# Patient Record
Sex: Male | Born: 1975
Health system: Southern US, Community
[De-identification: ages and names within clinical notes are randomized; demographics above are authoritative.]

## PROBLEM LIST (undated history)

## (undated) DIAGNOSIS — J181 Lobar pneumonia, unspecified organism: Secondary | ICD-10-CM

## (undated) DIAGNOSIS — D473 Essential (hemorrhagic) thrombocythemia: Secondary | ICD-10-CM

## (undated) DIAGNOSIS — T7840XA Allergy, unspecified, initial encounter: Secondary | ICD-10-CM

## (undated) HISTORY — DX: Lobar pneumonia, unspecified organism: J18.1

## (undated) HISTORY — PX: TONSILLECTOMY: SHX5217

## (undated) HISTORY — PX: TONSILLECTOMY AND ADENOIDECTOMY: SHX28

## (undated) HISTORY — DX: Allergy, unspecified, initial encounter: T78.40XA

---

## 2009-10-30 ENCOUNTER — Emergency Department (HOSPITAL_COMMUNITY): Admission: EM | Admit: 2009-10-30 | Discharge: 2009-10-30 | Payer: Self-pay | Admitting: Family Medicine

## 2010-03-30 ENCOUNTER — Ambulatory Visit: Payer: Self-pay | Admitting: Internal Medicine

## 2010-03-30 DIAGNOSIS — J309 Allergic rhinitis, unspecified: Secondary | ICD-10-CM

## 2010-06-04 ENCOUNTER — Ambulatory Visit: Payer: Self-pay | Admitting: Internal Medicine

## 2010-09-29 NOTE — Assessment & Plan Note (Signed)
Summary: SINUSITIS / CONGESTION // RS   Vital Signs:  Patient profile:   35 year old male Weight:      176 pounds Temp:     97.7 degrees F oral BP sitting:   108 / 76  (right arm) Cuff size:   regular  Vitals Entered By: Duard Brady LPN (June 04, 2010 1:14 PM) CC: c/o sinus congestion otc not helping Is Patient Diabetic? No   CC:  c/o sinus congestion otc not helping.  History of Present Illness: 35 year old patient who is seen today with a chief complaint of sinus and head congestion.  He has had no fever or purulent drainage.  There is been no focal sinus pain.  Does have a history of allergic rhinitis.  He is somewhat improved today.  He kept his appointments since he will be leaving the country for vacation next week.  Allergies: 1)  ! Joyce Copa  Past History:  Past Medical History: Reviewed history from 03/30/2010 and no changes required. Allergic rhinitis  Review of Systems       The patient complains of hoarseness.  The patient denies anorexia, fever, weight loss, weight gain, vision loss, decreased hearing, chest pain, syncope, dyspnea on exertion, peripheral edema, prolonged cough, headaches, hemoptysis, abdominal pain, melena, hematochezia, severe indigestion/heartburn, hematuria, incontinence, genital sores, muscle weakness, suspicious skin lesions, transient blindness, difficulty walking, depression, unusual weight change, abnormal bleeding, enlarged lymph nodes, angioedema, breast masses, and testicular masses.    Physical Exam  General:  Well-developed,well-nourished,in no acute distress; alert,appropriate and cooperative throughout examination Head:  Normocephalic and atraumatic without obvious abnormalities. No apparent alopecia or balding. Eyes:  No corneal or conjunctival inflammation noted. EOMI. Perrla. Funduscopic exam benign, without hemorrhages, exudates or papilledema. Vision grossly normal. Ears:  External ear exam shows no significant lesions  or deformities.  Otoscopic examination reveals clear canals, tympanic membranes are intact bilaterally without bulging, retraction, inflammation or discharge. Hearing is grossly normal bilaterally. Mouth:  Oral mucosa and oropharynx without lesions or exudates.  Teeth in good repair. Neck:  No deformities, masses, or tenderness noted. Lungs:  Normal respiratory effort, chest expands symmetrically. Lungs are clear to auscultation, no crackles or wheezes. Heart:  Normal rate and regular rhythm. S1 and S2 normal without gallop, murmur, click, rub or other extra sounds. Abdomen:  Bowel sounds positive,abdomen soft and non-tender without masses, organomegaly or hernias noted.   Impression & Recommendations:  Problem # 1:  ALLERGIC RHINITIS (ICD-477.9)  His updated medication list for this problem includes:    Fluticasone Propionate 50 Mcg/act Susp (Fluticasone propionate) ..... Use daily    Claritin 10 Mg Tabs (Loratadine)  His updated medication list for this problem includes:    Fluticasone Propionate 50 Mcg/act Susp (Fluticasone propionate) ..... Use daily    Claritin 10 Mg Tabs (Loratadine)  His updated medication list for this problem includes:    Fluticasone Propionate 50 Mcg/act Susp (Fluticasone propionate) ..... Use daily    Claritin 10 Mg Tabs (Loratadine)  Orders: Depo- Medrol 80mg  (J1040) Admin of Therapeutic Inj  intramuscular or subcutaneous (08657)  Complete Medication List: 1)  Fluticasone Propionate 50 Mcg/act Susp (Fluticasone propionate) .... Use daily 2)  Claritin 10 Mg Tabs (Loratadine)  Patient Instructions: 1)  Please schedule a follow-up appointment as needed.   Medication Administration  Injection # 1:    Medication: Depo- Medrol 80mg     Diagnosis: ALLERGIC RHINITIS (ICD-477.9)    Route: IM    Site: L deltoid    Exp  Date: 11/2012    Lot #: [id number]    Mfr: Pharmacia    Patient tolerated injection without complications    Given by: Duard Brady LPN  (June 04, 2010 1:35 PM)  Orders Added: 1)  Est. Patient Level III [16109] 2)  Depo- Medrol 80mg  [J1040] 3)  Admin of Therapeutic Inj  intramuscular or subcutaneous [60454]

## 2010-09-29 NOTE — Assessment & Plan Note (Signed)
Summary: NEW PT EST (PT WILL COME IN FASTING FOR POTENTIAL LABS) // RS   Vital Signs:  Patient profile:   35 year old male Height:      98.4 inches Weight:      173 pounds BMI:     12.61 Temp:     98.4 degrees F oral BP sitting:   150 / 90  (right arm) Cuff size:   regular  Vitals Entered By: Duard Brady LPN (March 30, 2010 8:34 AM) CC: new to establish - doing well Is Patient Diabetic? No   CC:  new to establish - doing well.  History of Present Illness: 35 year old patient who has enjoyed excellent health, who is in today to establish with our practice.  No complaints or concerns.  Past medical history is largely unremarkable except for a history of childhood asthma.  He does have some allergic rhinitis.  Issues  Preventive Screening-Counseling & Management  Alcohol-Tobacco     Smoking Status: never  Caffeine-Diet-Exercise     Does Patient Exercise: yes  Allergies: 1)  ! Joyce Copa  Past History:  Past Medical History: Allergic rhinitis  Past Surgical History: Tonsillectomy  1983  Family History: Reviewed history and no changes required. father age 19, status post CVA without residual functional deficits, history of hypertension, hypercholesterolemia mother is 8 in good health one sister in good health paternal grandmother breast cancer paternal grandfather lung cancer paternal uncle, coronary artery disease  Social History: Reviewed history and no changes required. 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 [employer] Smoking Status:  never Does Patient Exercise:  yes  Review of Systems  The patient denies anorexia, fever, weight loss, weight gain, vision loss, decreased hearing, hoarseness, chest pain, syncope, dyspnea on exertion, peripheral edema, prolonged cough, headaches, hemoptysis, abdominal pain, melena, hematochezia, severe indigestion/heartburn, hematuria, incontinence,  genital sores, muscle weakness, suspicious skin lesions, transient blindness, difficulty walking, depression, unusual weight change, abnormal bleeding, enlarged lymph nodes, angioedema, breast masses, and testicular masses.    Physical Exam  General:  Well-developed,well-nourished,in no acute distress; alert,appropriate and cooperative throughout examination Head:  Normocephalic and atraumatic without obvious abnormalities. No apparent alopecia or balding. Eyes:  No corneal or conjunctival inflammation noted. EOMI. Perrla. Funduscopic exam benign, without hemorrhages, exudates or papilledema. Vision grossly normal. Ears:  External ear exam shows no significant lesions or deformities.  Otoscopic examination reveals clear canals, tympanic membranes are intact bilaterally without bulging, retraction, inflammation or discharge. Hearing is grossly normal bilaterally. Mouth:  Oral mucosa and oropharynx without lesions or exudates.  Teeth in good repair. Neck:  No deformities, masses, or tenderness noted. Lungs:  Normal respiratory effort, chest expands symmetrically. Lungs are clear to auscultation, no crackles or wheezes. Heart:  Normal rate and regular rhythm. S1 and S2 normal without gallop, murmur, click, rub or other extra sounds. Abdomen:  Bowel sounds positive,abdomen soft and non-tender without masses, organomegaly or hernias noted. Genitalia:  Testes bilaterally descended without nodularity, tenderness or masses. No scrotal masses or lesions. No penis lesions or urethral discharge. Msk:  No deformity or scoliosis noted of thoracic or lumbar spine.   Pulses:  R and L carotid,radial,femoral,dorsalis pedis and posterior tibial pulses are full and equal bilaterally Extremities:  No clubbing, cyanosis, edema, or deformity noted with normal full range of motion of all joints.   Neurologic:  No cranial nerve deficits noted. Station and gait are normal. Plantar reflexes are down-going bilaterally. DTRs  are symmetrical throughout.  Sensory, motor and coordinative functions appear intact. Skin:  Intact without suspicious lesions or rashes Cervical Nodes:  No lymphadenopathy noted Axillary Nodes:  No palpable lymphadenopathy Inguinal Nodes:  No significant adenopathy Psych:  Cognition and judgment appear intact. Alert and cooperative with normal attention span and concentration. No apparent delusions, illusions, hallucinations   Impression & Recommendations:  Problem # 1:  ALLERGIC RHINITIS (ICD-477.9)  His updated medication list for this problem includes:    Fluticasone Propionate 50 Mcg/act Susp (Fluticasone propionate) ..... Use daily  Problem # 2:  Preventive Health Care (ICD-V70.0)  Complete Medication List: 1)  Fluticasone Propionate 50 Mcg/act Susp (Fluticasone propionate) .... Use daily  Patient Instructions: 1)  It is important that you exercise regularly at least 20 minutes 5 times a week. If you develop chest pain, have severe difficulty breathing, or feel very tired , stop exercising immediately and seek medical attention. Prescriptions: FLUTICASONE PROPIONATE 50 MCG/ACT SUSP (FLUTICASONE PROPIONATE) use daily  #3 x 6   Entered and Authorized by:   Gordy Savers  MD   Signed by:   Gordy Savers  MD on 03/30/2010   Method used:   Print then Give to Patient   RxID:   [id number]

## 2012-03-06 ENCOUNTER — Telehealth: Payer: Self-pay | Admitting: Internal Medicine

## 2012-03-06 NOTE — Telephone Encounter (Signed)
Caller: Jermane/Patient; PCP: Eleonore Chiquito; CB#: [phone];  Call regarding Abnormal HTLV Test 02/18/12 At [address];  Initially, test was reactive, then negative on repeat test. Denies any symptoms.  Transferred to office/Latoya for caller requesting routine appt, triage offered and declined per PCP Call, No Triage Guideline.

## 2012-03-06 NOTE — Telephone Encounter (Signed)
appt made for cpx labs 03/10/12 and 03/17/12 for cpx

## 2012-03-10 ENCOUNTER — Encounter: Payer: Self-pay | Admitting: Internal Medicine

## 2012-03-10 ENCOUNTER — Other Ambulatory Visit (INDEPENDENT_AMBULATORY_CARE_PROVIDER_SITE_OTHER): Payer: 59

## 2012-03-10 DIAGNOSIS — Z0000 Encounter for general adult medical examination without abnormal findings: Secondary | ICD-10-CM

## 2012-03-10 LAB — CBC WITH DIFFERENTIAL/PLATELET
Basophils Absolute: 0 10*3/uL (ref 0.0–0.1)
Lymphocytes Relative: 15.9 % (ref 12.0–46.0)
Lymphs Abs: 1 10*3/uL (ref 0.7–4.0)
Monocytes Relative: 7.3 % (ref 3.0–12.0)
Platelets: 128 10*3/uL — ABNORMAL LOW (ref 150.0–400.0)
RDW: 12.8 % (ref 11.5–14.6)

## 2012-03-10 LAB — POCT URINALYSIS DIPSTICK
Blood, UA: NEGATIVE
Ketones, UA: NEGATIVE
Leukocytes, UA: NEGATIVE
Spec Grav, UA: 1.015
pH, UA: 5.5

## 2012-03-10 LAB — HEPATIC FUNCTION PANEL
AST: 27 U/L (ref 0–37)
Total Bilirubin: 1 mg/dL (ref 0.3–1.2)

## 2012-03-10 LAB — TSH: TSH: 3.16 u[IU]/mL (ref 0.35–5.50)

## 2012-03-10 LAB — LIPID PANEL
HDL: 43.4 mg/dL (ref >39.00)
Triglycerides: 56 mg/dL (ref 0.0–149.0)
VLDL: 11.2 mg/dL (ref 0.0–40.0)

## 2012-03-10 LAB — BASIC METABOLIC PANEL
BUN: 21 mg/dL (ref 6–23)
Calcium: 9.3 mg/dL (ref 8.4–10.5)
GFR: 80.64 mL/min (ref >60.00)
Glucose, Bld: 96 mg/dL (ref 70–99)

## 2012-03-17 ENCOUNTER — Encounter: Payer: Self-pay | Admitting: Internal Medicine

## 2012-03-17 ENCOUNTER — Ambulatory Visit (INDEPENDENT_AMBULATORY_CARE_PROVIDER_SITE_OTHER): Payer: 59 | Admitting: Internal Medicine

## 2012-03-17 VITALS — BP 110/72 | HR 76 | Temp 98.3°F | Resp 20 | Ht 68.5 in | Wt 171.0 lb

## 2012-03-17 DIAGNOSIS — Z0000 Encounter for general adult medical examination without abnormal findings: Secondary | ICD-10-CM

## 2012-03-17 NOTE — Progress Notes (Signed)
  Subjective:    Patient ID: Robert Young, male    DOB: 12-25-1975, 36 y.o.   MRN: [id number]  HPI 36 year old patient who enjoys excellent health who is seen today for a health maintenance examination. He donates blood on a regular basis and has recently had a positive antibody screen for HTLV. This was not felt to be clinically relevant. No concerns or complaints. Has some mild allergic rhinitis issues  Family history fairly noncontributory father age 73 history of a stroke in his late 51s history of dyslipidemia and hypertension. Mother is 50 nonsignificant health one sister has thyroid disorder but otherwise well. Social history he is a Theatre manager, Public affairs consultant. 71-year-old daughter. Nonsmoker. Enjoys regular exercise.      Review of Systems  Constitutional: Negative for fever, chills, activity change, appetite change and fatigue.  HENT: Negative for hearing loss, ear pain, congestion, rhinorrhea, sneezing, mouth sores, trouble swallowing, neck pain, neck stiffness, dental problem, voice change, sinus pressure and tinnitus.   Eyes: Negative for photophobia, pain, redness and visual disturbance.  Respiratory: Negative for apnea, cough, choking, chest tightness, shortness of breath and wheezing.   Cardiovascular: Negative for chest pain, palpitations and leg swelling.  Gastrointestinal: Negative for nausea, vomiting, abdominal pain, diarrhea, constipation, blood in stool, abdominal distention, anal bleeding and rectal pain.  Genitourinary: Negative for dysuria, urgency, frequency, hematuria, flank pain, decreased urine volume, discharge, penile swelling, scrotal swelling, difficulty urinating, genital sores and testicular pain.  Musculoskeletal: Negative for myalgias, back pain, joint swelling, arthralgias and gait problem.  Skin: Negative for color change, rash and wound.  Neurological: Negative for dizziness, tremors, seizures, syncope, facial asymmetry, speech difficulty, weakness,  light-headedness, numbness and headaches.  Hematological: Negative for adenopathy. Does not bruise/bleed easily.  Psychiatric/Behavioral: Negative for suicidal ideas, hallucinations, behavioral problems, confusion, disturbed wake/sleep cycle, self-injury, dysphoric mood, decreased concentration and agitation. The patient is not nervous/anxious.        Objective:   Physical Exam  Constitutional: He appears well-developed and well-nourished.  HENT:  Head: Normocephalic and atraumatic.  Right Ear: External ear normal.  Left Ear: External ear normal.  Nose: Nose normal.  Mouth/Throat: Oropharynx is clear and moist.  Eyes: Conjunctivae and EOM are normal. Pupils are equal, round, and reactive to light. No scleral icterus.  Neck: Normal range of motion. Neck supple. No JVD present. No thyromegaly present.  Cardiovascular: Regular rhythm, normal heart sounds and intact distal pulses.  Exam reveals no gallop and no friction rub.   No murmur heard. Pulmonary/Chest: Effort normal and breath sounds normal. He exhibits no tenderness.  Abdominal: Soft. Bowel sounds are normal. He exhibits no distension and no mass. There is no tenderness.  Genitourinary: Prostate normal and penis normal.  Musculoskeletal: Normal range of motion. He exhibits no edema and no tenderness.  Lymphadenopathy:    He has no cervical adenopathy.  Neurological: He is alert. He has normal reflexes. No cranial nerve deficit. Coordination normal.  Skin: Skin is warm and dry. No rash noted.  Psychiatric: He has a normal mood and affect. His behavior is normal.          Assessment & Plan:  Preventive health examination  Regular exercise regimen encouraged Laboratory studies reviewed and copies given Return here when necessary or in 3-5 years.

## 2012-03-17 NOTE — Patient Instructions (Addendum)
It is important that you exercise regularly, at least 20 minutes 3 to 4 times per week.  If you develop chest pain or shortness of breath seek  medical attention.  Call or return to clinic prn if these symptoms worsen or fail to improve as anticipated.  

## 2013-09-19 ENCOUNTER — Encounter: Payer: Self-pay | Admitting: Internal Medicine

## 2013-09-19 ENCOUNTER — Ambulatory Visit (INDEPENDENT_AMBULATORY_CARE_PROVIDER_SITE_OTHER): Payer: 59 | Admitting: Internal Medicine

## 2013-09-19 VITALS — BP 126/80 | HR 58 | Temp 98.3°F | Resp 18 | Ht 68.5 in | Wt 177.0 lb

## 2013-09-19 DIAGNOSIS — M25512 Pain in left shoulder: Secondary | ICD-10-CM

## 2013-09-19 DIAGNOSIS — M25519 Pain in unspecified shoulder: Secondary | ICD-10-CM

## 2013-09-19 NOTE — Progress Notes (Signed)
Subjective:    Patient ID: Robert Young, male    DOB: Jan 21, 1976, 38 y.o.   MRN: [id number]  HPI  38 year old patient who presents with a chief complaint of left shoulder pain. This has been present for 6 or 7 months. He describes remote history of perhaps separating his left shoulder about 15 years ago. He is unclear of the details. He describes the pain as fairly minor but occurring daily. Pain is most significant when he lifts his left hand overhead. There has been no recent trauma but he is active with golf and his gym membership.  Past Medical History  Diagnosis Date  . Allergy     History   Social History  . Marital Status: Married    Spouse Name: N/A    Number of Children: N/A  . Years of Education: N/A   Occupational History  . Not on file.   Social History Main Topics  . Smoking status: Never Smoker   . Smokeless tobacco: Current User    Types: Chew  . Alcohol Use: Yes  . Drug Use: No  . Sexual Activity: Not on file   Other Topics Concern  . Not on file   Social History Narrative  . No narrative on file    Past Surgical History  Procedure Laterality Date  . Tonsillectomy      Family History  Problem Relation Age of Onset  . Heart disease Father   . Hyperlipidemia Father   . Hypertension Father   . Heart disease Paternal Uncle   . Cancer Paternal Grandmother     breast    Allergies  Allergen Reactions  . Fexofenadine     No current outpatient prescriptions on file prior to visit.   No current facility-administered medications on file prior to visit.    BP 126/80  Pulse 58  Temp(Src) 98.3 F (36.8 C) (Oral)  Resp 18  Ht 5' 8.5" (1.74 m)  Wt 177 lb (80.287 kg)  BMI 26.52 kg/m2  SpO2 99%       Review of Systems  Constitutional: Negative for fever, chills, appetite change and fatigue.  HENT: Negative for congestion, dental problem, ear pain, hearing loss, sore throat, tinnitus, trouble swallowing and voice change.   Eyes: Negative  for pain, discharge and visual disturbance.  Respiratory: Negative for cough, chest tightness, wheezing and stridor.   Cardiovascular: Negative for chest pain, palpitations and leg swelling.  Gastrointestinal: Negative for nausea, vomiting, abdominal pain, diarrhea, constipation, blood in stool and abdominal distention.  Genitourinary: Negative for urgency, hematuria, flank pain, discharge, difficulty urinating and genital sores.  Musculoskeletal: Positive for arthralgias. Negative for back pain, gait problem, joint swelling, myalgias and neck stiffness.       Left shoulder pain  Skin: Negative for rash.  Neurological: Negative for dizziness, syncope, speech difficulty, weakness, numbness and headaches.  Hematological: Negative for adenopathy. Does not bruise/bleed easily.  Psychiatric/Behavioral: Negative for behavioral problems and dysphoric mood. The patient is not nervous/anxious.        Objective:   Physical Exam  Constitutional: He appears well-developed and well-nourished. No distress.  Musculoskeletal:  Left shoulder examined No focal tenderness Range of motion appear to be intact Painful arc test revealed minimal discomfort at 120 Internal and external rotation lag test as well as external rotation resistance test and drop arm test were all normal          Assessment & Plan:   Left shoulder pain. Probable mild rotator cuff tendinopathy. We'll  give rehabilitation exercises and observe. He'll call if he is not pleased with his progress for consideration of orthopedic referral or more formal physical therapy

## 2013-09-19 NOTE — Patient Instructions (Signed)

## 2013-09-19 NOTE — Progress Notes (Signed)
Pre-visit discussion using our clinic review tool. No additional management support is needed unless otherwise documented below in the visit note.  

## 2013-10-02 ENCOUNTER — Telehealth: Payer: Self-pay | Admitting: Internal Medicine

## 2013-10-02 NOTE — Telephone Encounter (Signed)
Relevant patient education mailed to patient.  

## 2014-11-04 ENCOUNTER — Ambulatory Visit (INDEPENDENT_AMBULATORY_CARE_PROVIDER_SITE_OTHER): Payer: 59 | Admitting: Internal Medicine

## 2014-11-04 ENCOUNTER — Encounter: Payer: Self-pay | Admitting: Internal Medicine

## 2014-11-04 VITALS — BP 110/72 | HR 64 | Temp 98.2°F | Resp 18 | Ht 68.5 in | Wt 174.0 lb

## 2014-11-04 DIAGNOSIS — L309 Dermatitis, unspecified: Secondary | ICD-10-CM

## 2014-11-04 MED ORDER — PREDNISONE 10 MG PO TABS: 10.0000 mg | ORAL_TABLET | Freq: Two times a day (BID) | ORAL | Status: DC

## 2014-11-04 NOTE — Progress Notes (Signed)
Pre visit review using our clinic review tool, if applicable. No additional management support is needed unless otherwise documented below in the visit note. 

## 2014-11-04 NOTE — Patient Instructions (Signed)
Eczema Eczema, also called atopic dermatitis, is a skin disorder that causes inflammation of the skin. It causes a red rash and dry, scaly skin. The skin becomes very itchy. Eczema is generally worse during the cooler winter months and often improves with the warmth of summer. Eczema usually starts showing signs in infancy. Some children outgrow eczema, but it may last through adulthood.  CAUSES  The exact cause of eczema is not known, but it appears to run in families. People with eczema often have a family history of eczema, allergies, asthma, or hay fever. Eczema is not contagious. Flare-ups of the condition may be caused by:   Contact with something you are sensitive or allergic to.   Stress. SIGNS AND SYMPTOMS  Dry, scaly skin.   Red, itchy rash.   Itchiness. This may occur before the skin rash and may be very intense.  DIAGNOSIS  The diagnosis of eczema is usually made based on symptoms and medical history. TREATMENT  Eczema cannot be cured, but symptoms usually can be controlled with treatment and other strategies. A treatment plan might include:  Controlling the itching and scratching.   Use over-the-counter antihistamines as directed for itching. This is especially useful at night when the itching tends to be worse.   Use over-the-counter steroid creams as directed for itching.   Avoid scratching. Scratching makes the rash and itching worse. It may also result in a skin infection (impetigo) due to a break in the skin caused by scratching.   Keeping the skin well moisturized with creams every day. This will seal in moisture and help prevent dryness. Lotions that contain alcohol and water should be avoided because they can dry the skin.   Limiting exposure to things that you are sensitive or allergic to (allergens).   Recognizing situations that cause stress.   Developing a plan to manage stress.  HOME CARE INSTRUCTIONS   Only take over-the-counter or  prescription medicines as directed by your health care provider.   Do not use anything on the skin without checking with your health care provider.   Keep baths or showers short (5 minutes) in warm (not hot) water. Use mild cleansers for bathing. These should be unscented. You may add nonperfumed bath oil to the bath water. It is best to avoid soap and bubble bath.   Immediately after a bath or shower, when the skin is still damp, apply a moisturizing ointment to the entire body. This ointment should be a petroleum ointment. This will seal in moisture and help prevent dryness. The thicker the ointment, the better. These should be unscented.   Keep fingernails cut short. Children with eczema may need to wear soft gloves or mittens at night after applying an ointment.   Dress in clothes made of cotton or cotton blends. Dress lightly, because heat increases itching.   A child with eczema should stay away from anyone with fever blisters or cold sores. The virus that causes fever blisters (herpes simplex) can cause a serious skin infection in children with eczema. SEEK MEDICAL CARE IF:   Your itching interferes with sleep.   Your rash gets worse or is not better within 1 week after starting treatment.   You see pus or soft yellow scabs in the rash area.   You have a fever.   You have a rash flare-up after contact with someone who has fever blisters.  Document Released: 08/13/2000 Document Revised: 06/06/2013 Document Reviewed: 03/19/2013 Exit[employer] Patient Information 2015 [employer]. This information   is not intended to replace advice given to you by your health care provider. Make sure you discuss any questions you have with your health care provider.  

## 2014-11-04 NOTE — Progress Notes (Signed)
   Subjective:    Patient ID: Robert Young, male    DOB: 13-Dec-1975, 39 y.o.   MRN: [id number]  HPI  39 year old patient who presents with a three-week history of a fairly generalized pruritic rash.  This illness began involving the right hand area about 3 weeks ago.  It has spread to involve all extremities as well as the trunk and upper back area.  No history of atopic dermatitis, but he does have a history of allergic rhinitis.  No history of asthma  Past Medical History  Diagnosis Date  . Allergy     History   Social History  . Marital Status: Married    Spouse Name: N/A  . Number of Children: N/A  . Years of Education: N/A   Occupational History  . Not on file.   Social History Main Topics  . Smoking status: Never Smoker   . Smokeless tobacco: Current User    Types: Chew  . Alcohol Use: Yes  . Drug Use: No  . Sexual Activity: Not on file   Other Topics Concern  . Not on file   Social History Narrative    Past Surgical History  Procedure Laterality Date  . Tonsillectomy      Family History  Problem Relation Age of Onset  . Heart disease Father   . Hyperlipidemia Father   . Hypertension Father   . Heart disease Paternal Uncle   . Cancer Paternal Grandmother     breast    Allergies  Allergen Reactions  . Fexofenadine     Current Outpatient Prescriptions on File Prior to Visit  Medication Sig Dispense Refill  . naproxen sodium (ALEVE) 220 MG tablet Take 220 mg by mouth as needed.     No current facility-administered medications on file prior to visit.    BP 110/72 mmHg  Pulse 64  Temp(Src) 98.2 F (36.8 C) (Oral)  Resp 18  Ht 5' 8.5" (1.74 m)  Wt 174 lb (78.926 kg)  BMI 26.07 kg/m2  SpO2 98%     Review of Systems  Skin: Positive for rash.       Objective:   Physical Exam  Constitutional: He appears well-developed and well-nourished. No distress.  Skin:  Scattered areas of erythematous macules with some excoriations and  bruising Rashes present on the arms.  Extremities anterior chest and upper back          Assessment & Plan:   Eczema type rash.  Will treat with the prednisone 10 mg twice a day for a week was tried lotion will be applied Patient  Instructions for treatment of eczema type dermatitis.  Discussed and dispensed

## 2015-01-13 ENCOUNTER — Ambulatory Visit (INDEPENDENT_AMBULATORY_CARE_PROVIDER_SITE_OTHER): Payer: 59 | Admitting: Internal Medicine

## 2015-01-13 ENCOUNTER — Encounter: Payer: Self-pay | Admitting: Internal Medicine

## 2015-01-13 ENCOUNTER — Telehealth: Payer: Self-pay

## 2015-01-13 ENCOUNTER — Other Ambulatory Visit: Payer: Self-pay | Admitting: Hematology & Oncology

## 2015-01-13 VITALS — BP 120/80 | HR 67 | Temp 97.9°F | Resp 18 | Ht 68.5 in | Wt 175.0 lb

## 2015-01-13 DIAGNOSIS — R233 Spontaneous ecchymoses: Secondary | ICD-10-CM | POA: Diagnosis not present

## 2015-01-13 DIAGNOSIS — D693 Immune thrombocytopenic purpura: Secondary | ICD-10-CM

## 2015-01-13 LAB — COMPREHENSIVE METABOLIC PANEL
ALBUMIN: 4.7 g/dL (ref 3.5–5.2)
ALK PHOS: 53 U/L (ref 39–117)
ALT: 20 U/L (ref 0–53)
AST: 25 U/L (ref 0–37)
BILIRUBIN TOTAL: 0.9 mg/dL (ref 0.2–1.2)
BUN: 16 mg/dL (ref 6–23)
CALCIUM: 9.6 mg/dL (ref 8.4–10.5)
CO2: 31 mEq/L (ref 19–32)
CREATININE: 1.19 mg/dL (ref 0.40–1.50)
Chloride: 102 mEq/L (ref 96–112)
GFR: 73.21 mL/min (ref >60.00)
GLUCOSE: 119 mg/dL — AB (ref 70–99)
POTASSIUM: 3.5 meq/L (ref 3.5–5.1)
Sodium: 139 mEq/L (ref 135–145)
TOTAL PROTEIN: 7.1 g/dL (ref 6.0–8.3)

## 2015-01-13 LAB — CBC WITH DIFFERENTIAL/PLATELET
BASOS ABS: 0 10*3/uL (ref 0.0–0.1)
Basophils Relative: 0.3 % (ref 0.0–3.0)
EOS ABS: 0.3 10*3/uL (ref 0.0–0.7)
Eosinophils Relative: 3.9 % (ref 0.0–5.0)
HEMATOCRIT: 43.6 % (ref 39.0–52.0)
HEMOGLOBIN: 15 g/dL (ref 13.0–17.0)
LYMPHS ABS: 1.9 10*3/uL (ref 0.7–4.0)
Lymphocytes Relative: 29.8 % (ref 12.0–46.0)
MCHC: 34.4 g/dL (ref 30.0–36.0)
MCV: 86.9 fl (ref 78.0–100.0)
MONO ABS: 0.4 10*3/uL (ref 0.1–1.0)
Monocytes Relative: 5.7 % (ref 3.0–12.0)
NEUTROS ABS: 3.9 10*3/uL (ref 1.4–7.7)
Neutrophils Relative %: 60.3 % (ref 43.0–77.0)
RBC: 5.01 Mil/uL (ref 4.22–5.81)
RDW: 12.4 % (ref 11.5–15.5)
WBC: 6.4 10*3/uL (ref 4.0–10.5)

## 2015-01-13 LAB — POCT URINALYSIS DIPSTICK
BILIRUBIN UA: NEGATIVE
Glucose, UA: NEGATIVE
Ketones, UA: NEGATIVE
Leukocytes, UA: NEGATIVE
Nitrite, UA: NEGATIVE
Protein, UA: NEGATIVE
Spec Grav, UA: 1.015
Urobilinogen, UA: 0.2
pH, UA: 6

## 2015-01-13 LAB — TSH: TSH: 7.7 u[IU]/mL — ABNORMAL HIGH (ref 0.35–4.50)

## 2015-01-13 NOTE — Telephone Encounter (Signed)
CRITICAL VALUE STICKER  CRITICAL VALUE: Platelets 23  RECEIVER [facility]): ACM   DATE & TIME NOTIFIED: Jan 13, 2015 at 4:37  MD NOTIFIED: Burnice Logan   TIME OF NOTIFICATION: 4:40 pm  RESPONSE:

## 2015-01-13 NOTE — Progress Notes (Signed)
Pre visit review using our clinic review tool, if applicable. No additional management support is needed unless otherwise documented below in the visit note. 

## 2015-01-13 NOTE — Progress Notes (Signed)
Subjective:    Patient ID: Robert Young, male    DOB: 06-05-1976, 39 y.o.   MRN: [id number]  HPI 39 year old patient who was seen about 2 months ago for a pruritic dermatitis.  He seemed to have a dry pruritic eczematous type rash.  He was placed on 10 days of low-dose prednisone and the dermatitis resolved after 5 days.  The rash returned after discontinuation of prednisone He complains of some mild  itching.  He also has had several episodes of epistaxis since his last visit here.  He exercises regularly and has no systemic complaints feels well except for the dermatitis He also has noticed some bruising in the right flank area. He has been using some occasional nasal fluticasone, but otherwise no medications  Past Medical History  Diagnosis Date  . Allergy     History   Social History  . Marital Status: Married    Spouse Name: N/A  . Number of Children: N/A  . Years of Education: N/A   Occupational History  . Not on file.   Social History Main Topics  . Smoking status: Never Smoker   . Smokeless tobacco: Current User    Types: Chew  . Alcohol Use: Yes  . Drug Use: No  . Sexual Activity: Not on file   Other Topics Concern  . Not on file   Social History Narrative    Past Surgical History  Procedure Laterality Date  . Tonsillectomy      Family History  Problem Relation Age of Onset  . Heart disease Father   . Hyperlipidemia Father   . Hypertension Father   . Heart disease Paternal Uncle   . Cancer Paternal Grandmother     breast    Allergies  Allergen Reactions  . Fexofenadine     Current Outpatient Prescriptions on File Prior to Visit  Medication Sig Dispense Refill  . naproxen sodium (ALEVE) 220 MG tablet Take 220 mg by mouth as needed.     No current facility-administered medications on file prior to visit.    BP 120/80 mmHg  Pulse 67  Temp(Src) 97.9 F (36.6 C) (Oral)  Resp 18  Ht 5' 8.5" (1.74 m)  Wt 175 lb (79.379 kg)  BMI 26.22  kg/m2  SpO2 98%      Review of Systems  Constitutional: Negative for fever, chills, appetite change and fatigue.  HENT: Negative for congestion, dental problem, ear pain, hearing loss, sore throat, tinnitus, trouble swallowing and voice change.   Eyes: Negative for pain, discharge and visual disturbance.  Respiratory: Negative for cough, chest tightness, wheezing and stridor.   Cardiovascular: Negative for chest pain, palpitations and leg swelling.  Gastrointestinal: Negative for nausea, vomiting, abdominal pain, diarrhea, constipation, blood in stool and abdominal distention.  Genitourinary: Negative for urgency, hematuria, flank pain, discharge, difficulty urinating and genital sores.  Musculoskeletal: Negative for myalgias, back pain, joint swelling, arthralgias, gait problem and neck stiffness.  Skin: Positive for rash.  Neurological: Negative for dizziness, syncope, speech difficulty, weakness, numbness and headaches.  Hematological: Negative for adenopathy. Does not bruise/bleed easily.  Psychiatric/Behavioral: Negative for behavioral problems and dysphoric mood. The patient is not nervous/anxious.        Objective:   Physical Exam  Constitutional: He is oriented to person, place, and time. He appears well-developed.  HENT:  Head: Normocephalic.  Right Ear: External ear normal.  Left Ear: External ear normal.  Eyes: Conjunctivae and EOM are normal.  Neck: Normal range of motion.  Cardiovascular: Normal rate and normal heart sounds.   Pulmonary/Chest: Breath sounds normal.  Abdominal: Bowel sounds are normal.  Liver edge palpable on inspiration  Musculoskeletal: Normal range of motion. He exhibits no edema or tenderness.  Neurological: He is alert and oriented to person, place, and time.  Skin:  An approximate 4 x 8 cm ecchymoses in the right flank area  Scattered petechial lesions over the upper chest, upper arms and abdominal area.  A few scattered lesions over the  anterior lower legs Excoriations noted over the upper shoulder and back regions  Psychiatric: He has a normal mood and affect. His behavior is normal.          Assessment & Plan:   Petechial rash.  Rule out thrombocytopenia.  Likely will need hematology referral History of epistaxis Possible hepatomegaly  We'll check some primary lab studies with special attention to the platelet count. All medications will be held.

## 2015-01-13 NOTE — Patient Instructions (Signed)
Avoid aspirin, Aleve and other anti-inflammatory medications  Call or return to clinic prn if these symptoms worsen or fail to improve as anticipated.

## 2015-01-14 ENCOUNTER — Ambulatory Visit: Payer: 59

## 2015-01-14 ENCOUNTER — Ambulatory Visit (HOSPITAL_BASED_OUTPATIENT_CLINIC_OR_DEPARTMENT_OTHER): Payer: 59 | Admitting: Hematology & Oncology

## 2015-01-14 ENCOUNTER — Telehealth: Payer: Self-pay | Admitting: *Deleted

## 2015-01-14 ENCOUNTER — Other Ambulatory Visit (HOSPITAL_BASED_OUTPATIENT_CLINIC_OR_DEPARTMENT_OTHER): Payer: 59

## 2015-01-14 ENCOUNTER — Encounter: Payer: Self-pay | Admitting: Hematology & Oncology

## 2015-01-14 VITALS — BP 137/83 | HR 67 | Temp 98.3°F | Resp 18 | Ht 69.0 in | Wt 172.0 lb

## 2015-01-14 DIAGNOSIS — D693 Immune thrombocytopenic purpura: Secondary | ICD-10-CM

## 2015-01-14 DIAGNOSIS — D696 Thrombocytopenia, unspecified: Secondary | ICD-10-CM

## 2015-01-14 LAB — CBC WITH DIFFERENTIAL (CANCER CENTER ONLY)
BASO#: 0 10*3/uL (ref 0.0–0.2)
BASO%: 0.2 % (ref 0.0–2.0)
EOS%: 1.8 % (ref 0.0–7.0)
Eosinophils Absolute: 0.1 10*3/uL (ref 0.0–0.5)
HCT: 40.8 % (ref 38.7–49.9)
HEMOGLOBIN: 14.7 g/dL (ref 13.0–17.1)
LYMPH#: 0.9 10*3/uL (ref 0.9–3.3)
LYMPH%: 21.2 % (ref 14.0–48.0)
MCH: 31 pg (ref 28.0–33.4)
MCHC: 36 g/dL — ABNORMAL HIGH (ref 32.0–35.9)
MCV: 86 fL (ref 82–98)
MONO#: 0.3 10*3/uL (ref 0.1–0.9)
MONO%: 5.7 % (ref 0.0–13.0)
NEUT#: 3.1 10*3/uL (ref 1.5–6.5)
NEUT%: 71.1 % (ref 40.0–80.0)
Platelets: 21 10*3/uL — ABNORMAL LOW (ref 145–400)
RBC: 4.74 10*6/uL (ref 4.20–5.70)
RDW: 12.4 % (ref 11.1–15.7)
WBC: 4.4 10*3/uL (ref 4.0–10.0)

## 2015-01-14 LAB — LACTATE DEHYDROGENASE: LDH: 170 U/L (ref 94–250)

## 2015-01-14 LAB — COMPREHENSIVE METABOLIC PANEL
ALBUMIN: 4.8 g/dL (ref 3.5–5.2)
ALT: 18 U/L (ref 0–53)
AST: 23 U/L (ref 0–37)
Alkaline Phosphatase: 49 U/L (ref 39–117)
BUN: 15 mg/dL (ref 6–23)
CO2: 26 mEq/L (ref 19–32)
Calcium: 9.4 mg/dL (ref 8.4–10.5)
Chloride: 104 mEq/L (ref 96–112)
Creatinine, Ser: 1.16 mg/dL (ref 0.50–1.35)
GLUCOSE: 117 mg/dL — AB (ref 70–99)
POTASSIUM: 4 meq/L (ref 3.5–5.3)
SODIUM: 143 meq/L (ref 135–145)
Total Bilirubin: 1.8 mg/dL — ABNORMAL HIGH (ref 0.2–1.2)
Total Protein: 7 g/dL (ref 6.0–8.3)

## 2015-01-14 LAB — CHCC SATELLITE - SMEAR

## 2015-01-14 LAB — HEPATITIS PANEL, ACUTE
HCV Ab: NEGATIVE
HEP A IGM: NONREACTIVE
Hep B C IgM: NONREACTIVE
Hepatitis B Surface Ag: NEGATIVE

## 2015-01-14 LAB — HIV ANTIBODY (ROUTINE TESTING W REFLEX): HIV: NONREACTIVE

## 2015-01-14 LAB — TECHNOLOGIST REVIEW CHCC SATELLITE

## 2015-01-14 MED ORDER — FAMCICLOVIR 500 MG PO TABS: 500.0000 mg | ORAL_TABLET | Freq: Every day | ORAL | Status: DC

## 2015-01-14 MED ORDER — PREDNISONE 20 MG PO TABS: 80.0000 mg | ORAL_TABLET | Freq: Every day | ORAL | Status: DC

## 2015-01-14 NOTE — Telephone Encounter (Signed)
Per the [facility] office I have ascheduled appts. They will call him with appts

## 2015-01-14 NOTE — Progress Notes (Signed)
Referral MD  Reason for Referral: Thrombocytopenia-likely chronic immune thrombocytopenia   No chief complaint on file. : My platelets are low.  HPI: Mr. Headen is a really nice 39 year old white male. 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  He's been in great health. He works out. He has 2 kids.  He knows the rash about 6 weeks ago. His rashes all over his body. He had no fever associate with this rash. He had no nausea vomiting. There is no change in bowel or bladder habits. He had no cough. He had no shortness of breath. He really has not been on any medications. He really has not changed anything with his diet.  He saw his primary doctor. Dr. Burnice Logan put him on some prednisone. The rash seemed to go away.  Most recently, he had recurrence of the rash. He had a large ecchymoses in the right flank.  Dr. Burnice Logan is some lab work. He found that in the platelet count was down to 23,000. He had a normal white cell count and hemoglobin.  Going to the lab records, his blood count was 128,003 years ago.  He's had no obvious bleeding. He's had maybe occasional epistaxis.  There is no weight loss or weight gain. He's had no palpable lymph nodes. He's had no abdominal pain.  He does not smoke. He drinks on occasion.  There is no risk factors for HIV or hepatitis. He never has had any kind of blood transfusion.  He was kindly referred to the [facility] for an evaluation.  Again, he works in [employer]. He is not exposed to actual radiation doses.  There is no history in the family of any kind of blood or platelet problem.  He works out. He's had no problems working out.  Overall, his performance status is ECOG 0.   Past Medical History  Diagnosis Date  . Allergy   :  Past Surgical History  Procedure Laterality Date  . Tonsillectomy    :   Current outpatient prescriptions:  .  naproxen sodium (ALEVE) 220 MG  tablet, Take 220 mg by mouth as needed., Disp: , Rfl:  .  famciclovir (FAMVIR) 500 MG tablet, Take 1 tablet (500 mg total) by mouth daily., Disp: 30 tablet, Rfl: 4 .  predniSONE (DELTASONE) 20 MG tablet, Take 4 tablets (80 mg total) by mouth daily with breakfast., Disp: 120 tablet, Rfl: 2:  :  Allergies  Allergen Reactions  . Fexofenadine   :  Family History  Problem Relation Age of Onset  . Heart disease Father   . Hyperlipidemia Father   . Hypertension Father   . Heart disease Paternal Uncle   . Cancer Paternal Grandmother     breast  :  History   Social History  . Marital Status: Married    Spouse Name: N/A  . Number of Children: N/A  . Years of Education: N/A   Occupational History  . Not on file.   Social History Main Topics  . Smoking status: Never Smoker   . Smokeless tobacco: Current User    Types: Chew     Comment: NEVER USED TOBACCO  . Alcohol Use: 0.0 oz/week    0 Standard drinks or equivalent per week  . Drug Use: No  . Sexual Activity: Not on file   Other Topics Concern  . Not on file   Social History Narrative  :  Pertinent items are noted in HPI.  Exam: @IPVITALS @  well-developed and well-nourished white gentleman in no obvious distress. Head and neck exam shows no ocular or oral lesions. He has no intraoral petechia or ecchymoses or hematomas. There are no palpable cervical or supraclavicular lymph nodes. Lungs are clear. Cardiac exam regular rate and rhythm with no murmurs, rubs or bruits. Abdomen is soft. He has good bowel sounds. There is no fluid wave. There is no palpable liver or spleen tip. Back exam shows no tenderness over the spine, ribs or hips. Axillary exam shows no bilateral axillary adenopathy. Extremities shows no clubbing, cyanosis or edema. There is no joint erythema, warmth or swelling. He has good range of motion of his joints. Skin exam shows an ecchymoses over on the right flank. He may have a couple areas of petechia in his  legs. No other areas of ecchymoses are noted. Neurological exam shows no focal neurological deficits.    Recent Labs  01/13/15 1326 01/14/15 0932  WBC 6.4 4.4  HGB 15.0 14.7  HCT 43.6 40.8  PLT 23.0 Repeated and verified X2.* 21*    Recent Labs  01/13/15 1326  NA 139  K 3.5  CL 102  CO2 31  GLUCOSE 119*  BUN 16  CREATININE 1.19  CALCIUM 9.6    Blood smear review: Normochromic and normocytic population of red blood cells. He has no nucleator red blood cells. There are no teardrop cells. There are no schistocytes or spherocytes. I see no target cells. There are no inclusion bodies. White cells are normal in morphology maturation. There is no immature myeloid or lymphoid forms. He has no hypersegmented polys. I see no atypical lymphocytes. Platelets are decreased in number. He has marked weight was decreased. Has a few large platelets. Platelets are well granulated.  Pathology: None     Assessment and Plan: Mr. Centrella is a 39 year old white male. He has isolated thrombocytopenia. His exam is present unremarkable. I cannot palpate any hepatosplenomegaly or lymphadenopathy.  His blood smear certainly is not concerning for any hematologic malignancy. I don't see anything that looks like leukemia or other marrow infiltrative disorder.  I have to believe that this is chronic immune thrombocytopenia. As such, I think we can start him on prednisone. I will put him on 80 mg a day of prednisone. I will give him some Famvir at 500 mg a day to take along with this. I told her to take over-the-counter Pepcid twice a day.  I will like to check his CBC weekly. 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  I don't see that we have to do a bone marrow biopsy on him right now.  I would like to expect that his platelet count will respond. If not, that they will have to consider a bone marrow biopsy on him.  I spent about 60 minutes with him. I went over all of his lab  work. I will does blood smear. I explained what I thought he had.  I think with steroids, that should be about 70% response rate and remission rate.  If he responds and then relapses, then I probably would consider a splenectomy for him.  Again, we will follow his blood work weekly. I will taking a lot of this over the phone with respect to adjust his steroid dose.  I will see him back in one month.

## 2015-01-21 ENCOUNTER — Ambulatory Visit (HOSPITAL_BASED_OUTPATIENT_CLINIC_OR_DEPARTMENT_OTHER): Payer: 59

## 2015-01-21 DIAGNOSIS — D693 Immune thrombocytopenic purpura: Secondary | ICD-10-CM | POA: Diagnosis not present

## 2015-01-21 LAB — CBC WITH DIFFERENTIAL/PLATELET
BASO%: 0 % (ref 0.0–2.0)
Basophils Absolute: 0 10*3/uL (ref 0.0–0.1)
EOS%: 0 % (ref 0.0–7.0)
Eosinophils Absolute: 0 10*3/uL (ref 0.0–0.5)
HCT: 41 % (ref 38.4–49.9)
HGB: 14.7 g/dL (ref 13.0–17.1)
LYMPH%: 7.5 % — ABNORMAL LOW (ref 14.0–49.0)
MCH: 30.9 pg (ref 27.2–33.4)
MCHC: 35.9 g/dL (ref 32.0–36.0)
MCV: 86.1 fL (ref 79.3–98.0)
MONO#: 0.2 10*3/uL (ref 0.1–0.9)
MONO%: 2.8 % (ref 0.0–14.0)
NEUT#: 7.3 10*3/uL — ABNORMAL HIGH (ref 1.5–6.5)
NEUT%: 89.7 % — ABNORMAL HIGH (ref 39.0–75.0)
Platelets: 108 10*3/uL — ABNORMAL LOW (ref 140–400)
RBC: 4.76 10*6/uL (ref 4.20–5.82)
RDW: 12.6 % (ref 11.0–14.6)
WBC: 8.1 10*3/uL (ref 4.0–10.3)
lymph#: 0.6 10*3/uL — ABNORMAL LOW (ref 0.9–3.3)

## 2015-01-22 ENCOUNTER — Other Ambulatory Visit: Payer: 59

## 2015-01-29 ENCOUNTER — Other Ambulatory Visit (HOSPITAL_BASED_OUTPATIENT_CLINIC_OR_DEPARTMENT_OTHER): Payer: 59

## 2015-01-29 DIAGNOSIS — D693 Immune thrombocytopenic purpura: Secondary | ICD-10-CM

## 2015-01-29 LAB — CBC WITH DIFFERENTIAL/PLATELET
BASO%: 0.2 % (ref 0.0–2.0)
BASOS ABS: 0 10*3/uL (ref 0.0–0.1)
EOS ABS: 0.1 10*3/uL (ref 0.0–0.5)
EOS%: 0.5 % (ref 0.0–7.0)
HEMATOCRIT: 45 % (ref 38.4–49.9)
HGB: 15.2 g/dL (ref 13.0–17.1)
LYMPH#: 1.4 10*3/uL (ref 0.9–3.3)
LYMPH%: 11.1 % — AB (ref 14.0–49.0)
MCH: 30 pg (ref 27.2–33.4)
MCHC: 33.9 g/dL (ref 32.0–36.0)
MCV: 88.5 fL (ref 79.3–98.0)
MONO#: 0.6 10*3/uL (ref 0.1–0.9)
MONO%: 4.9 % (ref 0.0–14.0)
NEUT#: 10.8 10*3/uL — ABNORMAL HIGH (ref 1.5–6.5)
NEUT%: 83.3 % — AB (ref 39.0–75.0)
Platelets: 119 10*3/uL — ABNORMAL LOW (ref 140–400)
RBC: 5.08 10*6/uL (ref 4.20–5.82)
RDW: 13.4 % (ref 11.0–14.6)
WBC: 13 10*3/uL — AB (ref 4.0–10.3)

## 2015-01-30 ENCOUNTER — Telehealth: Payer: Self-pay | Admitting: *Deleted

## 2015-01-30 NOTE — Telephone Encounter (Signed)
-----   Message from Volanda Napoleon, MD sent at 01/29/2015  4:33 PM EDT ----- Call - tell him to alternate 60mg  prednisone a day with 40mg  a day.  Robert Young

## 2015-02-05 ENCOUNTER — Other Ambulatory Visit (HOSPITAL_BASED_OUTPATIENT_CLINIC_OR_DEPARTMENT_OTHER): Payer: 59

## 2015-02-05 DIAGNOSIS — D696 Thrombocytopenia, unspecified: Secondary | ICD-10-CM

## 2015-02-05 DIAGNOSIS — D693 Immune thrombocytopenic purpura: Secondary | ICD-10-CM

## 2015-02-05 LAB — CBC WITH DIFFERENTIAL/PLATELET
BASO%: 0.2 % (ref 0.0–2.0)
BASOS ABS: 0 10*3/uL (ref 0.0–0.1)
EOS%: 0.5 % (ref 0.0–7.0)
Eosinophils Absolute: 0.1 10*3/uL (ref 0.0–0.5)
HEMATOCRIT: 42.7 % (ref 38.4–49.9)
HGB: 14.6 g/dL (ref 13.0–17.1)
LYMPH#: 1.1 10*3/uL (ref 0.9–3.3)
LYMPH%: 9.4 % — AB (ref 14.0–49.0)
MCH: 30 pg (ref 27.2–33.4)
MCHC: 34.1 g/dL (ref 32.0–36.0)
MCV: 87.9 fL (ref 79.3–98.0)
MONO#: 0.5 10*3/uL (ref 0.1–0.9)
MONO%: 4.4 % (ref 0.0–14.0)
NEUT#: 10.4 10*3/uL — ABNORMAL HIGH (ref 1.5–6.5)
NEUT%: 85.5 % — ABNORMAL HIGH (ref 39.0–75.0)
PLATELETS: 92 10*3/uL — AB (ref 140–400)
RBC: 4.86 10*6/uL (ref 4.20–5.82)
RDW: 12.9 % (ref 11.0–14.6)
WBC: 12.1 10*3/uL — ABNORMAL HIGH (ref 4.0–10.3)

## 2015-02-06 ENCOUNTER — Encounter: Payer: Self-pay | Admitting: Hematology & Oncology

## 2015-02-06 ENCOUNTER — Telehealth: Payer: Self-pay | Admitting: *Deleted

## 2015-02-06 NOTE — Telephone Encounter (Signed)
I called him and said his blood count was down a little bit more. I told him that we have to go up to 60 mg daily prednisone. He was on 60 mg alternating with 40 mg a day of prednisone. He understands. I think I will see him in the next week or so.   Phone call from Dr. Marin Olp copied from [facility]

## 2015-02-12 ENCOUNTER — Other Ambulatory Visit: Payer: 59

## 2015-02-14 ENCOUNTER — Other Ambulatory Visit (HOSPITAL_BASED_OUTPATIENT_CLINIC_OR_DEPARTMENT_OTHER): Payer: 59

## 2015-02-14 DIAGNOSIS — D693 Immune thrombocytopenic purpura: Secondary | ICD-10-CM

## 2015-02-14 LAB — COMPREHENSIVE METABOLIC PANEL (CC13)
ALT: 22 U/L (ref 0–55)
ANION GAP: 7 meq/L (ref 3–11)
AST: 18 U/L (ref 5–34)
Albumin: 4.4 g/dL (ref 3.5–5.0)
Alkaline Phosphatase: 40 U/L (ref 40–150)
BUN: 19 mg/dL (ref 7.0–26.0)
CO2: 33 mEq/L — ABNORMAL HIGH (ref 22–29)
CREATININE: 1.3 mg/dL (ref 0.7–1.3)
Calcium: 9.4 mg/dL (ref 8.4–10.4)
Chloride: 100 mEq/L (ref 98–109)
EGFR: 70 mL/min/{1.73_m2} — ABNORMAL LOW (ref >90)
Glucose: 101 mg/dl (ref 70–140)
Potassium: 3.8 mEq/L (ref 3.5–5.1)
Sodium: 140 mEq/L (ref 136–145)
Total Bilirubin: 1.05 mg/dL (ref 0.20–1.20)
Total Protein: 6.4 g/dL (ref 6.4–8.3)

## 2015-02-14 LAB — CBC WITH DIFFERENTIAL/PLATELET
BASO%: 0 % (ref 0.0–2.0)
BASOS ABS: 0 10*3/uL (ref 0.0–0.1)
EOS%: 0.2 % (ref 0.0–7.0)
Eosinophils Absolute: 0 10*3/uL (ref 0.0–0.5)
HCT: 44 % (ref 38.4–49.9)
HGB: 15.4 g/dL (ref 13.0–17.1)
LYMPH#: 0.8 10*3/uL — AB (ref 0.9–3.3)
LYMPH%: 7.7 % — ABNORMAL LOW (ref 14.0–49.0)
MCH: 30.9 pg (ref 27.2–33.4)
MCHC: 35 g/dL (ref 32.0–36.0)
MCV: 88.2 fL (ref 79.3–98.0)
MONO#: 0.3 10*3/uL (ref 0.1–0.9)
MONO%: 3.2 % (ref 0.0–14.0)
NEUT%: 88.9 % — ABNORMAL HIGH (ref 39.0–75.0)
NEUTROS ABS: 9.2 10*3/uL — AB (ref 1.5–6.5)
Platelets: 114 10*3/uL — ABNORMAL LOW (ref 140–400)
RBC: 4.99 10*6/uL (ref 4.20–5.82)
RDW: 12.7 % (ref 11.0–14.6)
WBC: 10.3 10*3/uL (ref 4.0–10.3)

## 2015-02-14 LAB — CHCC SATELLITE - SMEAR

## 2015-02-17 ENCOUNTER — Ambulatory Visit (HOSPITAL_BASED_OUTPATIENT_CLINIC_OR_DEPARTMENT_OTHER): Payer: 59 | Admitting: Family

## 2015-02-17 ENCOUNTER — Other Ambulatory Visit (HOSPITAL_BASED_OUTPATIENT_CLINIC_OR_DEPARTMENT_OTHER): Payer: 59

## 2015-02-17 VITALS — BP 154/72 | HR 66 | Temp 98.2°F | Resp 18 | Wt 177.0 lb

## 2015-02-17 DIAGNOSIS — D693 Immune thrombocytopenic purpura: Secondary | ICD-10-CM | POA: Diagnosis not present

## 2015-02-17 LAB — CBC WITH DIFFERENTIAL (CANCER CENTER ONLY)
BASO#: 0 10*3/uL (ref 0.0–0.2)
BASO%: 0.1 % (ref 0.0–2.0)
EOS%: 0.1 % (ref 0.0–7.0)
Eosinophils Absolute: 0 10*3/uL (ref 0.0–0.5)
HCT: 43.5 % (ref 38.7–49.9)
HGB: 15.3 g/dL (ref 13.0–17.1)
LYMPH#: 0.6 10*3/uL — ABNORMAL LOW (ref 0.9–3.3)
LYMPH%: 6.4 % — AB (ref 14.0–48.0)
MCH: 31.1 pg (ref 28.0–33.4)
MCHC: 35.2 g/dL (ref 32.0–35.9)
MCV: 88 fL (ref 82–98)
MONO#: 0.2 10*3/uL (ref 0.1–0.9)
MONO%: 2.4 % (ref 0.0–13.0)
NEUT%: 91 % — ABNORMAL HIGH (ref 40.0–80.0)
NEUTROS ABS: 8.5 10*3/uL — AB (ref 1.5–6.5)
Platelets: 131 10*3/uL — ABNORMAL LOW (ref 145–400)
RBC: 4.92 10*6/uL (ref 4.20–5.70)
RDW: 12.4 % (ref 11.1–15.7)
WBC: 9.3 10*3/uL (ref 4.0–10.0)

## 2015-02-17 NOTE — Progress Notes (Signed)
Hematology and Oncology Follow Up Visit  Robert Young [id number] Mar 27, 1976 39 y.o. 02/17/2015   Principle Diagnosis:  Chronic immune thrombocytopenia   Current Therapy:   Prednisone 60 mg daily - to taper (will start alternating 40 mg and 60 mg for the next week)    Interim History:  Robert Young is here today for a follow-up. He is now on 60 mg of Prednisone daily. His platelet count is up to 131. The ecchymosis on his right flank is gone. He does have some petechia along the right clavicle. No epistaxis or hemoptysis. No blood in urine or stool. He is not anemic.  No new aches or pains. No swelling, tenderness, numbness or tingling in his extremities.  He has felt bloated at times but has not been constipated.  No problem with infections. No fever, chills, n/v, cough, dizziness, headaches, blurred vision, SOB, chest pain, palpitations, abdominal pain or diarrhea.  No lymphadenopathy or organomegaly found on assessment.  His appetite is good and he is staying well hydrated. His weight is stable.   Medications:    Medication List       This list is accurate as of: 02/17/15  3:43 PM.  Always use your most recent med list.               famciclovir 500 MG tablet  Commonly known as:  FAMVIR  Take 1 tablet (500 mg total) by mouth daily.     predniSONE 20 MG tablet  Commonly known as:  DELTASONE  Take 4 tablets (80 mg total) by mouth daily with breakfast.        Allergies:  Allergies  Allergen Reactions  . Fexofenadine     Past Medical History, Surgical history, Social history, and Family History were reviewed and updated.  Review of Systems: All other 10 point review of systems is negative.   Physical Exam:  weight is 177 lb (80.287 kg). His oral temperature is 98.2 F (36.8 C). His blood pressure is 154/72 and his pulse is 66. His respiration is 18.   Wt Readings from Last 3 Encounters:  02/17/15 177 lb (80.287 kg)  01/14/15 172 lb (78.019 kg)  01/13/15 175 lb  (79.379 kg)    Ocular: Sclerae unicteric, pupils equal, round and reactive to light Ear-nose-throat: Oropharynx clear, dentition fair Lymphatic: No cervical or supraclavicular adenopathy Lungs no rales or rhonchi, good excursion bilaterally Heart regular rate and rhythm, no murmur appreciated Abd soft, nontender, positive bowel sounds MSK no focal spinal tenderness, no joint edema Neuro: non-focal, well-oriented, appropriate affect Breasts: Deferred  Lab Results  Component Value Date   WBC 9.3 02/17/2015   HGB 15.3 02/17/2015   HCT 43.5 02/17/2015   MCV 88 02/17/2015   PLT 131* 02/17/2015   No results found for: FERRITIN, IRON, TIBC, UIBC, IRONPCTSAT Lab Results  Component Value Date   RBC 4.92 02/17/2015   No results found for: KPAFRELGTCHN, LAMBDASER, KAPLAMBRATIO No results found for: IGGSERUM, IGA, IGMSERUM No results found for: Ronnald Ramp, A1GS, A2GS, Violet Baldy, MSPIKE, SPEI   Chemistry      Component Value Date/Time   NA 140 02/14/2015 1005   NA 143 01/14/2015 0934   K 3.8 02/14/2015 1005   K 4.0 01/14/2015 0934   CL 104 01/14/2015 0934   CO2 33* 02/14/2015 1005   CO2 26 01/14/2015 0934   BUN 19.0 02/14/2015 1005   BUN 15 01/14/2015 0934   CREATININE 1.3 02/14/2015 1005   CREATININE 1.16 01/14/2015  4734      Component Value Date/Time   CALCIUM 9.4 02/14/2015 1005   CALCIUM 9.4 01/14/2015 0934   ALKPHOS 40 02/14/2015 1005   ALKPHOS 49 01/14/2015 0934   AST 18 02/14/2015 1005   AST 23 01/14/2015 0934   ALT 22 02/14/2015 1005   ALT 18 01/14/2015 0934   BILITOT 1.05 02/14/2015 1005   BILITOT 1.8* 01/14/2015 0934     Impression and Plan: Robert Young is a 39 year old white male with chronic immune thrombocytopenia. He is responding nicely to the Prednisone. He is now on 60 mg daily and has a platelet count of 131. The ecchymosis and petechia are also improving.  We will have him rotate 40 mg and 60 mg daily (monday 60 mg, Tuesday 40 mg,  Wednesday 60 mg, etc.) starting today and recheck his lab work on Monday.  If he continues to respond we will continue weaning his dosing next week.  We continue to check his lab work weekly on Mondays at [facility] where he works.  He will continue to take Famvir and Pepcid daily as well.  We will plan to see him back in 6 weeks for follow-up.  He will contact our office with any questions or concerns. We can certainly see him sooner if need be.   Eliezer Bottom, NP 6/20/20163:43 PM

## 2015-02-19 ENCOUNTER — Other Ambulatory Visit: Payer: 59

## 2015-02-24 ENCOUNTER — Other Ambulatory Visit (HOSPITAL_BASED_OUTPATIENT_CLINIC_OR_DEPARTMENT_OTHER): Payer: 59

## 2015-02-24 DIAGNOSIS — D693 Immune thrombocytopenic purpura: Secondary | ICD-10-CM

## 2015-02-24 LAB — CBC WITH DIFFERENTIAL/PLATELET
BASO%: 0.2 % (ref 0.0–2.0)
Basophils Absolute: 0 10*3/uL (ref 0.0–0.1)
EOS%: 0.2 % (ref 0.0–7.0)
Eosinophils Absolute: 0 10*3/uL (ref 0.0–0.5)
HEMATOCRIT: 43.8 % (ref 38.4–49.9)
HGB: 15 g/dL (ref 13.0–17.1)
LYMPH%: 13.1 % — AB (ref 14.0–49.0)
MCH: 30.6 pg (ref 27.2–33.4)
MCHC: 34.4 g/dL (ref 32.0–36.0)
MCV: 89 fL (ref 79.3–98.0)
MONO#: 0.5 10*3/uL (ref 0.1–0.9)
MONO%: 5.5 % (ref 0.0–14.0)
NEUT#: 6.8 10*3/uL — ABNORMAL HIGH (ref 1.5–6.5)
NEUT%: 81 % — AB (ref 39.0–75.0)
Platelets: 123 10*3/uL — ABNORMAL LOW (ref 140–400)
RBC: 4.92 10*6/uL (ref 4.20–5.82)
RDW: 13.4 % (ref 11.0–14.6)
WBC: 8.4 10*3/uL (ref 4.0–10.3)
lymph#: 1.1 10*3/uL (ref 0.9–3.3)

## 2015-02-26 ENCOUNTER — Encounter: Payer: Self-pay | Admitting: Hematology & Oncology

## 2015-02-26 ENCOUNTER — Other Ambulatory Visit: Payer: 59

## 2015-03-04 ENCOUNTER — Other Ambulatory Visit (HOSPITAL_BASED_OUTPATIENT_CLINIC_OR_DEPARTMENT_OTHER): Payer: 59

## 2015-03-04 DIAGNOSIS — D693 Immune thrombocytopenic purpura: Secondary | ICD-10-CM | POA: Diagnosis not present

## 2015-03-04 LAB — CBC WITH DIFFERENTIAL/PLATELET
BASO%: 0.2 % (ref 0.0–2.0)
Basophils Absolute: 0 10*3/uL (ref 0.0–0.1)
EOS%: 0.3 % (ref 0.0–7.0)
Eosinophils Absolute: 0 10*3/uL (ref 0.0–0.5)
HCT: 43.8 % (ref 38.4–49.9)
HGB: 15 g/dL (ref 13.0–17.1)
LYMPH%: 14.5 % (ref 14.0–49.0)
MCH: 30.7 pg (ref 27.2–33.4)
MCHC: 34.3 g/dL (ref 32.0–36.0)
MCV: 89.5 fL (ref 79.3–98.0)
MONO#: 0.6 10*3/uL (ref 0.1–0.9)
MONO%: 6.1 % (ref 0.0–14.0)
NEUT%: 78.9 % — ABNORMAL HIGH (ref 39.0–75.0)
NEUTROS ABS: 7.4 10*3/uL — AB (ref 1.5–6.5)
Platelets: 125 10*3/uL — ABNORMAL LOW (ref 140–400)
RBC: 4.9 10*6/uL (ref 4.20–5.82)
RDW: 13.4 % (ref 11.0–14.6)
WBC: 9.3 10*3/uL (ref 4.0–10.3)
lymph#: 1.4 10*3/uL (ref 0.9–3.3)

## 2015-03-05 ENCOUNTER — Encounter: Payer: Self-pay | Admitting: *Deleted

## 2015-03-05 ENCOUNTER — Other Ambulatory Visit: Payer: 59

## 2015-03-05 NOTE — Telephone Encounter (Signed)
Wayne, Wicklund - 03/04/15 >','<< Less Detail',event)" href="javascript:;">More Detail >>   Volanda Napoleon, MD   Sent: Tue March 04, 2015 2:54 PM    To: P Onc Nurse [facility]        Result Note     Dr Marin Olp called and told him to take his prednisone dose down to 30 mg a day. We are still following his platelets weekly.              Attached to     CBC WITH DIFFERENTIAL/PLATELET (Order# [id number])      CBC with Differential/Platelet  Status: Finalresult Visible to patient:  MyChart Nextappt: 03/10/2015 at 08:30 AM in Oncology [facility] 2)          Notes Recorded by Volanda Napoleon, MD on 03/04/2015 at 2:54 PM I called and told him to take his prednisone dose down to 30 mg a day. We are still following his platelets weekly.  Robert Young       Ref Range 1d ago (03/04/15) 9d ago (02/24/15) 2wk ago (02/17/15) 2wk ago (02/14/15) 4wk ago (02/05/15) 17mo ago (01/29/15) 75mo ago (01/21/15)   WBC 4.0 - 10.3 10e3/uL 9.3 8.4 9.3R 10.3 12.1 (H) 13.0 (H) 8.1   NEUT# 1.5 - 6.5 10e3/uL 7.4 (H) 6.8 (H) 8.5 (H) 9.2 (H) 10.4 (H) 10.8 (H) 7.3 (H)   HGB 13.0 - 17.1 g/dL 15.0 15.0 15.3 15.4 14.6 15.2 14.7   HCT 38.4 - 49.9 % 43.8 43.8 43.5R 44.0 42.7 45.0 41.0   Platelets 140 - 400 10e3/uL 125 (L) 123 (L) 131 (L)R 114 Platel... (L) 92 (L) 119 (L) 108 (L)   MCV 79.3 - 98.0 fL 89.5 89.0 88R 88.2 87.9 88.5 86.1   MCH 27.2 - 33.4 pg 30.7 30.6 31.1R 30.9 30.0 30.0 30.9   MCHC 32.0 - 36.0 g/dL 34.3 34.4 35.2R 35.0 34.1 33.9 35.9   RBC 4.20 - 5.82 10e6/uL 4.90 4.92 4.92R 4.99 4.86 5.08 4.76   RDW 11.0 - 14.6 % 13.4 13.4 12.4R 12.7 12.9 13.4 12.6   lymph# 0.9 - 3.3 10e3/uL 1.4 1.1 0.6 (L) 0.8 (L) 1.1 1.4 0.6 (L)   MONO# 0.1 - 0.9 10e3/uL 0.6 0.5 0.2 0.3 0.5 0.6 0.2   Eosinophils Absolute 0.0 - 0.5 10e3/uL 0.0 0.0 0.0 0.0 0.1 0.1 0.0   Basophils Absolute 0.0 - 0.1 10e3/uL 0.0 0.0  0.0 0.0 0.0 0.0   NEUT% 39.0 - 75.0 % 78.9 (H) 81.0 (H) 91.0 (H)R 88.9 (H) 85.5 (H) 83.3 (H) 89.7 (H)     LYMPH% 14.0 - 49.0 % 14.5 13.1 (L) 6.4 (L)R 7.7 (L) 9.4 (L) 11.1 (L) 7.5 (L)   MONO% 0.0 - 14.0 % 6.1 5.5 2.4R 3.2 4.4 4.9 2.8   EOS% 0.0 - 7.0 % 0.3 0.2 0.1 0.2 0.5 0.5 0.0   BASO% 0.0 - 2.0 % 0.2 0.2 0.1 0.0 0.2 0.2 0.0  Resulting Agency  [facility]  Narrative    Performed At: [facility]        [address]        [place], [place] [address]      Specimen Collected: 03/04/15 8:30 AM Last Resulted: 03/04/15 8:41 AM Lab Flowsheet Order Details View Encounter Lab and Collection Details Routing Result History    R=Reference range differs from displayed range          Status of Other Orders  Lab Status Result Date Provider Status   CBC with Differential/Platelet No Result  Ordered        Canceled   CBC with Differential ([facility])

## 2015-03-10 ENCOUNTER — Encounter: Payer: Self-pay | Admitting: *Deleted

## 2015-03-10 ENCOUNTER — Other Ambulatory Visit (HOSPITAL_BASED_OUTPATIENT_CLINIC_OR_DEPARTMENT_OTHER): Payer: 59

## 2015-03-10 DIAGNOSIS — D693 Immune thrombocytopenic purpura: Secondary | ICD-10-CM | POA: Diagnosis not present

## 2015-03-10 LAB — CBC WITH DIFFERENTIAL/PLATELET
BASO%: 0.2 % (ref 0.0–2.0)
Basophils Absolute: 0 10*3/uL (ref 0.0–0.1)
EOS%: 0.4 % (ref 0.0–7.0)
Eosinophils Absolute: 0 10*3/uL (ref 0.0–0.5)
HEMATOCRIT: 42.1 % (ref 38.4–49.9)
HGB: 14.5 g/dL (ref 13.0–17.1)
LYMPH%: 7.5 % — ABNORMAL LOW (ref 14.0–49.0)
MCH: 30.4 pg (ref 27.2–33.4)
MCHC: 34.4 g/dL (ref 32.0–36.0)
MCV: 88.4 fL (ref 79.3–98.0)
MONO#: 0.6 10*3/uL (ref 0.1–0.9)
MONO%: 5 % (ref 0.0–14.0)
NEUT%: 86.9 % — ABNORMAL HIGH (ref 39.0–75.0)
NEUTROS ABS: 10.1 10*3/uL — AB (ref 1.5–6.5)
Platelets: 117 10*3/uL — ABNORMAL LOW (ref 140–400)
RBC: 4.77 10*6/uL (ref 4.20–5.82)
RDW: 13.4 % (ref 11.0–14.6)
WBC: 11.6 10*3/uL — ABNORMAL HIGH (ref 4.0–10.3)
lymph#: 0.9 10*3/uL (ref 0.9–3.3)

## 2015-03-12 ENCOUNTER — Other Ambulatory Visit: Payer: 59

## 2015-03-17 ENCOUNTER — Other Ambulatory Visit (HOSPITAL_BASED_OUTPATIENT_CLINIC_OR_DEPARTMENT_OTHER): Payer: 59

## 2015-03-17 DIAGNOSIS — D693 Immune thrombocytopenic purpura: Secondary | ICD-10-CM

## 2015-03-17 LAB — CBC WITH DIFFERENTIAL/PLATELET
BASO%: 0.2 % (ref 0.0–2.0)
Basophils Absolute: 0 10*3/uL (ref 0.0–0.1)
EOS ABS: 0 10*3/uL (ref 0.0–0.5)
EOS%: 0.3 % (ref 0.0–7.0)
HEMATOCRIT: 44.5 % (ref 38.4–49.9)
HGB: 15.2 g/dL (ref 13.0–17.1)
LYMPH%: 9 % — AB (ref 14.0–49.0)
MCH: 30.2 pg (ref 27.2–33.4)
MCHC: 34.1 g/dL (ref 32.0–36.0)
MCV: 88.5 fL (ref 79.3–98.0)
MONO#: 0.6 10*3/uL (ref 0.1–0.9)
MONO%: 5.1 % (ref 0.0–14.0)
NEUT%: 85.4 % — ABNORMAL HIGH (ref 39.0–75.0)
NEUTROS ABS: 10.7 10*3/uL — AB (ref 1.5–6.5)
PLATELETS: 117 10*3/uL — AB (ref 140–400)
RBC: 5.02 10*6/uL (ref 4.20–5.82)
RDW: 13.3 % (ref 11.0–14.6)
WBC: 12.5 10*3/uL — ABNORMAL HIGH (ref 4.0–10.3)
lymph#: 1.1 10*3/uL (ref 0.9–3.3)

## 2015-03-19 ENCOUNTER — Other Ambulatory Visit: Payer: 59

## 2015-03-24 ENCOUNTER — Other Ambulatory Visit (HOSPITAL_BASED_OUTPATIENT_CLINIC_OR_DEPARTMENT_OTHER): Payer: 59

## 2015-03-24 ENCOUNTER — Telehealth: Payer: Self-pay | Admitting: *Deleted

## 2015-03-24 DIAGNOSIS — D693 Immune thrombocytopenic purpura: Secondary | ICD-10-CM | POA: Diagnosis not present

## 2015-03-24 LAB — CBC WITH DIFFERENTIAL/PLATELET
BASO%: 0.3 % (ref 0.0–2.0)
Basophils Absolute: 0 10*3/uL (ref 0.0–0.1)
EOS%: 1.1 % (ref 0.0–7.0)
Eosinophils Absolute: 0.1 10*3/uL (ref 0.0–0.5)
HCT: 42.9 % (ref 38.4–49.9)
HEMOGLOBIN: 15 g/dL (ref 13.0–17.1)
LYMPH%: 26.1 % (ref 14.0–49.0)
MCH: 30.8 pg (ref 27.2–33.4)
MCHC: 35 g/dL (ref 32.0–36.0)
MCV: 88.1 fL (ref 79.3–98.0)
MONO#: 0.7 10*3/uL (ref 0.1–0.9)
MONO%: 9.1 % (ref 0.0–14.0)
NEUT#: 5.1 10*3/uL (ref 1.5–6.5)
NEUT%: 63.4 % (ref 39.0–75.0)
PLATELETS: 105 10*3/uL — AB (ref 140–400)
RBC: 4.87 10*6/uL (ref 4.20–5.82)
RDW: 13.4 % (ref 11.0–14.6)
WBC: 8 10*3/uL (ref 4.0–10.3)
lymph#: 2.1 10*3/uL (ref 0.9–3.3)

## 2015-03-24 NOTE — Telephone Encounter (Addendum)
Patient aware of results and continue prednison  ----- Message from Volanda Napoleon, MD sent at 03/24/2015 10:51 AM EDT ----- Call - tell him the platelets are a little lower but NOT to change the prednisone dose!!  pete

## 2015-03-26 ENCOUNTER — Other Ambulatory Visit: Payer: 59

## 2015-04-02 ENCOUNTER — Other Ambulatory Visit: Payer: 59

## 2015-04-04 ENCOUNTER — Ambulatory Visit (HOSPITAL_BASED_OUTPATIENT_CLINIC_OR_DEPARTMENT_OTHER): Payer: 59 | Admitting: Hematology & Oncology

## 2015-04-04 ENCOUNTER — Other Ambulatory Visit (HOSPITAL_BASED_OUTPATIENT_CLINIC_OR_DEPARTMENT_OTHER): Payer: 59

## 2015-04-04 ENCOUNTER — Encounter: Payer: Self-pay | Admitting: Hematology & Oncology

## 2015-04-04 VITALS — BP 136/82 | HR 67 | Temp 97.7°F | Resp 20 | Ht 69.0 in | Wt 175.0 lb

## 2015-04-04 DIAGNOSIS — D693 Immune thrombocytopenic purpura: Secondary | ICD-10-CM

## 2015-04-04 LAB — CBC WITH DIFFERENTIAL (CANCER CENTER ONLY)
BASO#: 0 10*3/uL (ref 0.0–0.2)
BASO%: 0.2 % (ref 0.0–2.0)
EOS ABS: 0 10*3/uL (ref 0.0–0.5)
EOS%: 0.8 % (ref 0.0–7.0)
HEMATOCRIT: 41.4 % (ref 38.7–49.9)
HEMOGLOBIN: 14.7 g/dL (ref 13.0–17.1)
LYMPH#: 1.8 10*3/uL (ref 0.9–3.3)
LYMPH%: 34.5 % (ref 14.0–48.0)
MCH: 31.4 pg (ref 28.0–33.4)
MCHC: 35.5 g/dL (ref 32.0–35.9)
MCV: 89 fL (ref 82–98)
MONO#: 0.4 10*3/uL (ref 0.1–0.9)
MONO%: 7.7 % (ref 0.0–13.0)
NEUT#: 3 10*3/uL (ref 1.5–6.5)
NEUT%: 56.8 % (ref 40.0–80.0)
Platelets: 122 10*3/uL — ABNORMAL LOW (ref 145–400)
RBC: 4.68 10*6/uL (ref 4.20–5.70)
RDW: 12.3 % (ref 11.1–15.7)
WBC: 5.2 10*3/uL (ref 4.0–10.0)

## 2015-04-04 LAB — CHCC SATELLITE - SMEAR

## 2015-04-04 MED ORDER — PREDNISONE 20 MG PO TABS: ORAL_TABLET | ORAL | Status: DC

## 2015-04-04 MED ORDER — FAMCICLOVIR 500 MG PO TABS
500.0000 mg | ORAL_TABLET | Freq: Every day | ORAL | Status: DC
Start: 2015-04-04 — End: 2015-08-13

## 2015-04-04 NOTE — Progress Notes (Signed)
Hematology and Oncology Follow Up Visit  Robert Young [id number] 1976/07/19 39 y.o. 04/04/2015   Principle Diagnosis:  Chronic immune thrombocytopenia   Current Therapy:   Prednisone taper-currently on 20 mg by mouth daily    Interim History:  Robert Young is here today for a follow-up. He is doing quite well. He recently was at a mention of in [place]. He enjoyed himself.  He's had no problems with bleeding or bruising. He's had no nausea or vomiting.  He is doing well with the prednisone. We are trying to taper him slowly.  He's had no problems with nausea or vomiting.  He is on Famvir.  He's had no change in bowel or bladder habits.  He is sleeping okay.  He's had no rashes. He's had no mouth sores. He's had no fever. There's been no cough or shortness of breath.  Overall, his performance status is ECOG 0. .   Medications:    Medication List       This list is accurate as of: 04/04/15 11:26 AM.  Always use your most recent med list.               famciclovir 500 MG tablet  Commonly known as:  FAMVIR  Take 1 tablet (500 mg total) by mouth daily.     predniSONE 20 MG tablet  Commonly known as:  DELTASONE  Alternate taking 20 mg a day with 10 mg a day. Take with food.        Allergies:  Allergies  Allergen Reactions  . Fexofenadine     Past Medical History, Surgical history, Social history, and Family History were reviewed and updated.  Review of Systems: All other 10 point review of systems is negative.   Physical Exam:  height is 5\' 9"  (1.753 m) and weight is 175 lb (79.379 kg). His oral temperature is 97.7 F (36.5 C). His blood pressure is 136/82 and his pulse is 67. His respiration is 20.   Wt Readings from Last 3 Encounters:  04/04/15 175 lb (79.379 kg)  02/17/15 177 lb (80.287 kg)  01/14/15 172 lb (78.019 kg)    Head and neck exam shows no ocular or oral lesion. There are no palpable cervical or supraclavicular lymph nodes. Lungs are  clear. Cardiac exam regular rate and rhythm with no murmurs, rubs or bruits. Abdomen is soft. He has good bowel sounds. There is no fluid wave. There is no palpable liver or spleen tip. Back exam shows no tenderness over the spine, ribs or hips. Extremities shows no clubbing, cyanosis or edema. Skin exam shows no rashes, ecchymoses or petechia. Neurological exam shows no focal neurological deficits.   Lab Results  Component Value Date   WBC 5.2 04/04/2015   HGB 14.7 04/04/2015   HCT 41.4 04/04/2015   MCV 89 04/04/2015   PLT 122 Platelet count consistent in citrate* 04/04/2015   No results found for: FERRITIN, IRON, TIBC, UIBC, IRONPCTSAT Lab Results  Component Value Date   RBC 4.68 04/04/2015   No results found for: KPAFRELGTCHN, LAMBDASER, KAPLAMBRATIO No results found for: IGGSERUM, IGA, IGMSERUM No results found for: Ronnald Ramp, A1GS, A2GS, Tillman Sers, SPEI   Chemistry      Component Value Date/Time   NA 140 02/14/2015 1005   NA 143 01/14/2015 0934   K 3.8 02/14/2015 1005   K 4.0 01/14/2015 0934   CL 104 01/14/2015 0934   CO2 33* 02/14/2015 1005   CO2 26 01/14/2015 0934  BUN 19.0 02/14/2015 1005   BUN 15 01/14/2015 0934   CREATININE 1.3 02/14/2015 1005   CREATININE 1.16 01/14/2015 0934      Component Value Date/Time   CALCIUM 9.4 02/14/2015 1005   CALCIUM 9.4 01/14/2015 0934   ALKPHOS 40 02/14/2015 1005   ALKPHOS 49 01/14/2015 0934   AST 18 02/14/2015 1005   AST 23 01/14/2015 0934   ALT 22 02/14/2015 1005   ALT 18 01/14/2015 0934   BILITOT 1.05 02/14/2015 1005   BILITOT 1.8* 01/14/2015 0934     Impression and Plan: Mr. Jacobsen is a 39 year old white male with chronic immune thrombocytopenia. He responded well to prednisone. He's done well.  I don't think his plate count will ever get "normal". However, free this keep him above 75-80,000, he will do okay.  For now, we will continue the prednisone taper. We have to keep going down  slowly. I told him to alternate 20 mg day with 10 mg a day.  I think we can probably check his blood counts now every 2 weeks.  I want to see him back in 6 weeks.  I told him that if he relapses, then the next step probably would be splenectomy since he has responded to the prednisone. He will continue to take Famvir and Pepcid daily as well.  Volanda Napoleon, MD 8/5/201611:26 AM

## 2015-04-09 ENCOUNTER — Other Ambulatory Visit: Payer: 59

## 2015-04-16 ENCOUNTER — Other Ambulatory Visit: Payer: 59

## 2015-04-17 ENCOUNTER — Telehealth: Payer: Self-pay | Admitting: *Deleted

## 2015-04-17 ENCOUNTER — Other Ambulatory Visit (HOSPITAL_BASED_OUTPATIENT_CLINIC_OR_DEPARTMENT_OTHER): Payer: 59

## 2015-04-17 DIAGNOSIS — D693 Immune thrombocytopenic purpura: Secondary | ICD-10-CM | POA: Diagnosis not present

## 2015-04-17 LAB — CBC WITH DIFFERENTIAL/PLATELET
BASO%: 0.3 % (ref 0.0–2.0)
Basophils Absolute: 0 10*3/uL (ref 0.0–0.1)
EOS ABS: 0.1 10*3/uL (ref 0.0–0.5)
EOS%: 1.1 % (ref 0.0–7.0)
HCT: 41.8 % (ref 38.4–49.9)
HEMOGLOBIN: 14.5 g/dL (ref 13.0–17.1)
LYMPH#: 1.3 10*3/uL (ref 0.9–3.3)
LYMPH%: 17.7 % (ref 14.0–49.0)
MCH: 30.7 pg (ref 27.2–33.4)
MCHC: 34.6 g/dL (ref 32.0–36.0)
MCV: 88.8 fL (ref 79.3–98.0)
MONO#: 0.6 10*3/uL (ref 0.1–0.9)
MONO%: 7.8 % (ref 0.0–14.0)
NEUT#: 5.3 10*3/uL (ref 1.5–6.5)
NEUT%: 73.1 % (ref 39.0–75.0)
PLATELETS: 147 10*3/uL (ref 140–400)
RBC: 4.71 10*6/uL (ref 4.20–5.82)
RDW: 12.9 % (ref 11.0–14.6)
WBC: 7.2 10*3/uL (ref 4.0–10.3)

## 2015-04-17 NOTE — Telephone Encounter (Signed)
-----   Message from Volanda Napoleon, MD sent at 04/17/2015  4:28 PM EDT ----- Call - platelet is 147K!!!  This is great!!  Decrease Prednisione to 10mg  a day!!!  pete

## 2015-04-18 ENCOUNTER — Other Ambulatory Visit: Payer: 59

## 2015-04-23 ENCOUNTER — Other Ambulatory Visit: Payer: 59

## 2015-04-30 ENCOUNTER — Other Ambulatory Visit: Payer: 59

## 2015-05-02 ENCOUNTER — Other Ambulatory Visit (HOSPITAL_BASED_OUTPATIENT_CLINIC_OR_DEPARTMENT_OTHER): Payer: 59

## 2015-05-02 ENCOUNTER — Encounter: Payer: Self-pay | Admitting: Hematology & Oncology

## 2015-05-02 DIAGNOSIS — D693 Immune thrombocytopenic purpura: Secondary | ICD-10-CM | POA: Diagnosis not present

## 2015-05-02 LAB — CBC WITH DIFFERENTIAL/PLATELET
BASO%: 0.1 % (ref 0.0–2.0)
Basophils Absolute: 0 10*3/uL (ref 0.0–0.1)
EOS%: 1 % (ref 0.0–7.0)
Eosinophils Absolute: 0.1 10*3/uL (ref 0.0–0.5)
HCT: 40.5 % (ref 38.4–49.9)
HGB: 14.2 g/dL (ref 13.0–17.1)
LYMPH%: 14.5 % (ref 14.0–49.0)
MCH: 31.2 pg (ref 27.2–33.4)
MCHC: 35.1 g/dL (ref 32.0–36.0)
MCV: 89 fL (ref 79.3–98.0)
MONO#: 0.4 10*3/uL (ref 0.1–0.9)
MONO%: 5 % (ref 0.0–14.0)
NEUT#: 5.6 10*3/uL (ref 1.5–6.5)
NEUT%: 79.4 % — ABNORMAL HIGH (ref 39.0–75.0)
Platelets: 138 10*3/uL — ABNORMAL LOW (ref 140–400)
RBC: 4.55 10*6/uL (ref 4.20–5.82)
RDW: 12.7 % (ref 11.0–14.6)
WBC: 7.1 10*3/uL (ref 4.0–10.3)
lymph#: 1 10*3/uL (ref 0.9–3.3)

## 2015-05-02 NOTE — Progress Notes (Signed)
Patient emailed office back confirming that he received Dr Antonieta Pert message.

## 2015-05-07 ENCOUNTER — Other Ambulatory Visit: Payer: 59

## 2015-05-14 ENCOUNTER — Other Ambulatory Visit: Payer: 59

## 2015-05-16 ENCOUNTER — Ambulatory Visit (HOSPITAL_BASED_OUTPATIENT_CLINIC_OR_DEPARTMENT_OTHER): Payer: 59 | Admitting: Hematology & Oncology

## 2015-05-16 ENCOUNTER — Encounter: Payer: Self-pay | Admitting: Hematology & Oncology

## 2015-05-16 ENCOUNTER — Other Ambulatory Visit (HOSPITAL_BASED_OUTPATIENT_CLINIC_OR_DEPARTMENT_OTHER): Payer: 59

## 2015-05-16 VITALS — BP 133/79 | HR 56 | Temp 97.8°F | Resp 16 | Ht 69.0 in | Wt 175.0 lb

## 2015-05-16 DIAGNOSIS — D693 Immune thrombocytopenic purpura: Secondary | ICD-10-CM | POA: Diagnosis not present

## 2015-05-16 LAB — COMPREHENSIVE METABOLIC PANEL (CC13)
ALK PHOS: 46 U/L (ref 40–150)
ALT: 17 U/L (ref 0–55)
AST: 18 U/L (ref 5–34)
Albumin: 4.6 g/dL (ref 3.5–5.0)
Anion Gap: 7 mEq/L (ref 3–11)
BUN: 15.2 mg/dL (ref 7.0–26.0)
CHLORIDE: 103 meq/L (ref 98–109)
CO2: 31 mEq/L — ABNORMAL HIGH (ref 22–29)
Calcium: 9.6 mg/dL (ref 8.4–10.4)
Creatinine: 1.2 mg/dL (ref 0.7–1.3)
EGFR: 75 mL/min/{1.73_m2} — AB (ref >90)
Glucose: 101 mg/dl (ref 70–140)
POTASSIUM: 4.5 meq/L (ref 3.5–5.1)
SODIUM: 142 meq/L (ref 136–145)
Total Bilirubin: 1.19 mg/dL (ref 0.20–1.20)
Total Protein: 6.8 g/dL (ref 6.4–8.3)

## 2015-05-16 LAB — CBC WITH DIFFERENTIAL (CANCER CENTER ONLY)
BASO#: 0 10*3/uL (ref 0.0–0.2)
BASO%: 0.2 % (ref 0.0–2.0)
EOS%: 1.1 % (ref 0.0–7.0)
Eosinophils Absolute: 0.1 10*3/uL (ref 0.0–0.5)
HCT: 43 % (ref 38.7–49.9)
HGB: 15.3 g/dL (ref 13.0–17.1)
LYMPH#: 1 10*3/uL (ref 0.9–3.3)
LYMPH%: 15.5 % (ref 14.0–48.0)
MCH: 31.5 pg (ref 28.0–33.4)
MCHC: 35.6 g/dL (ref 32.0–35.9)
MCV: 89 fL (ref 82–98)
MONO#: 0.3 10*3/uL (ref 0.1–0.9)
MONO%: 4.8 % (ref 0.0–13.0)
NEUT#: 5.1 10*3/uL (ref 1.5–6.5)
NEUT%: 78.4 % (ref 40.0–80.0)
PLATELETS: 151 10*3/uL (ref 145–400)
RBC: 4.85 10*6/uL (ref 4.20–5.70)
RDW: 12.2 % (ref 11.1–15.7)
WBC: 6.4 10*3/uL (ref 4.0–10.0)

## 2015-05-16 LAB — CHCC SATELLITE - SMEAR

## 2015-05-16 NOTE — Progress Notes (Signed)
Hematology and Oncology Follow Up Visit  SHADMAN TOZZI [id number] [birth year] 39 y.o. 05/16/2015   Principle Diagnosis:  Chronic immune thrombocytopenia   Current Therapy:   Prednisone to stop now.    Interim History:  Mr. Trimarco is here today for a follow-up. He is doing quite well. He is on 5 mg a day alternating with 10 mg daily prednisone. He is done well with this. He has had no side effects with this.  His platelet count is being maintained. We are checking his platelet count every 2-3 weeks. We are finding that his platelet count is holding pretty steady.  He's had no mouth sores.  He's had no change in bowel or bladder habits.  He's had no rashes.  He is on Famvir and doing well with Famvir.   Overall, his performance status is ECOG 0. .   Medications:    Medication List       This list is accurate as of: 05/16/15 12:21 PM.  Always use your most recent med list.               famciclovir 500 MG tablet  Commonly known as:  FAMVIR  Take 1 tablet (500 mg total) by mouth daily.     predniSONE 20 MG tablet  Commonly known as:  DELTASONE  Alternate taking 20 mg a day with 10 mg a day. Take with food.        Allergies:  Allergies  Allergen Reactions  . Fexofenadine     Past Medical History, Surgical history, Social history, and Family History were reviewed and updated.  Review of Systems: All other 10 point review of systems is negative.   Physical Exam:  height is 5\' 9"  (1.753 m) and weight is 175 lb (79.379 kg). His oral temperature is 97.8 F (36.6 C). His blood pressure is 133/79 and his pulse is 56. His respiration is 16.   Wt Readings from Last 3 Encounters:  05/16/15 175 lb (79.379 kg)  04/04/15 175 lb (79.379 kg)  02/17/15 177 lb (80.287 kg)    Head and neck exam shows no ocular or oral lesion. There are no palpable cervical or supraclavicular lymph nodes. Lungs are clear. Cardiac exam regular rate and rhythm with no murmurs, rubs or  bruits. Abdomen is soft. He has good bowel sounds. There is no fluid wave. There is no palpable liver or spleen tip. Back exam shows no tenderness over the spine, ribs or hips. Extremities shows no clubbing, cyanosis or edema. Skin exam shows no rashes, ecchymoses or petechia. Neurological exam shows no focal neurological deficits.   Lab Results  Component Value Date   WBC 6.4 05/16/2015   HGB 15.3 05/16/2015   HCT 43.0 05/16/2015   MCV 89 05/16/2015   PLT 151 05/16/2015   No results found for: FERRITIN, IRON, TIBC, UIBC, IRONPCTSAT Lab Results  Component Value Date   RBC 4.85 05/16/2015   No results found for: KPAFRELGTCHN, LAMBDASER, KAPLAMBRATIO No results found for: IGGSERUM, IGA, IGMSERUM No results found for: Ronnald Ramp, A1GS, A2GS, Tillman Sers, SPEI   Chemistry      Component Value Date/Time   NA 140 02/14/2015 1005   NA 143 01/14/2015 0934   K 3.8 02/14/2015 1005   K 4.0 01/14/2015 0934   CL 104 01/14/2015 0934   CO2 33* 02/14/2015 1005   CO2 26 01/14/2015 0934   BUN 19.0 02/14/2015 1005   BUN 15 01/14/2015 0934   CREATININE 1.3 02/14/2015  1005   CREATININE 1.16 01/14/2015 0934      Component Value Date/Time   CALCIUM 9.4 02/14/2015 1005   CALCIUM 9.4 01/14/2015 0934   ALKPHOS 40 02/14/2015 1005   ALKPHOS 49 01/14/2015 0934   AST 18 02/14/2015 1005   AST 23 01/14/2015 0934   ALT 22 02/14/2015 1005   ALT 18 01/14/2015 0934   BILITOT 1.05 02/14/2015 1005   BILITOT 1.8* 01/14/2015 0934     Impression and Plan: Mr. Schicker is a 39 year old white male with chronic immune thrombocytopenia. He responded well to prednisone.   For now, we will get him off prednisone. He is on a very low dose now. I think we can stop the red zone.  I want him just stay on Famvir for another 3 weeks.  We can have his labs checked monthly for right now.  Again, I think that if he relapses, I would seriously consider him for a splenectomy. I think that  given his young age, his colectomy would be favorable as this would have a very high success rate of remission.  I don't think we have to get him back to the office now. I will have his labs checked monthly. If we see any changes with his platelet count, then we can certainly have him come back for a checkup.  Volanda Napoleon, MD 9/16/201612:21 PM

## 2015-05-20 ENCOUNTER — Telehealth: Payer: Self-pay | Admitting: Hematology & Oncology

## 2015-05-20 NOTE — Telephone Encounter (Signed)
appt calendare was mailed out to patient's home

## 2015-06-09 ENCOUNTER — Other Ambulatory Visit (HOSPITAL_BASED_OUTPATIENT_CLINIC_OR_DEPARTMENT_OTHER): Payer: 59

## 2015-06-09 DIAGNOSIS — D693 Immune thrombocytopenic purpura: Secondary | ICD-10-CM | POA: Diagnosis not present

## 2015-06-09 LAB — CBC WITH DIFFERENTIAL/PLATELET
BASO%: 0.3 % (ref 0.0–2.0)
BASOS ABS: 0 10*3/uL (ref 0.0–0.1)
EOS ABS: 0.3 10*3/uL (ref 0.0–0.5)
EOS%: 5.4 % (ref 0.0–7.0)
HEMATOCRIT: 41.9 % (ref 38.4–49.9)
HEMOGLOBIN: 14.7 g/dL (ref 13.0–17.1)
LYMPH#: 1.4 10*3/uL (ref 0.9–3.3)
LYMPH%: 22.8 % (ref 14.0–49.0)
MCH: 30.9 pg (ref 27.2–33.4)
MCHC: 34.9 g/dL (ref 32.0–36.0)
MCV: 88.4 fL (ref 79.3–98.0)
MONO#: 0.4 10*3/uL (ref 0.1–0.9)
MONO%: 6.5 % (ref 0.0–14.0)
NEUT#: 4 10*3/uL (ref 1.5–6.5)
NEUT%: 65 % (ref 39.0–75.0)
PLATELETS: 141 10*3/uL (ref 140–400)
RBC: 4.75 10*6/uL (ref 4.20–5.82)
RDW: 12.2 % (ref 11.0–14.6)
WBC: 6.1 10*3/uL (ref 4.0–10.3)

## 2015-06-18 ENCOUNTER — Encounter: Payer: Self-pay | Admitting: Internal Medicine

## 2015-06-18 ENCOUNTER — Ambulatory Visit (INDEPENDENT_AMBULATORY_CARE_PROVIDER_SITE_OTHER): Payer: 59 | Admitting: Internal Medicine

## 2015-06-18 VITALS — BP 136/90 | HR 62 | Temp 98.3°F | Resp 20 | Ht 69.0 in | Wt 172.0 lb

## 2015-06-18 DIAGNOSIS — J3089 Other allergic rhinitis: Secondary | ICD-10-CM | POA: Diagnosis not present

## 2015-06-18 DIAGNOSIS — D693 Immune thrombocytopenic purpura: Secondary | ICD-10-CM

## 2015-06-18 MED ORDER — FLUTICASONE PROPIONATE 50 MCG/ACT NA SUSP: 2.0000 | Freq: Every day | NASAL | Status: DC

## 2015-06-18 MED ORDER — PREDNISONE 20 MG PO TABS: 20.0000 mg | ORAL_TABLET | Freq: Two times a day (BID) | ORAL | Status: DC

## 2015-06-18 NOTE — Progress Notes (Signed)
Subjective:    Patient ID: Robert Young, male    DOB: Nov 01, 1975, 39 y.o.   MRN: [id number]  HPI  39 year old patient who has a history of ITP who has been on prednisone therapy that has recently been tapered and discontinued.  He does have a history of allergic rhinitis and has been on nasal steroids in the past.  For the past 9 days has had increasing sinus pressure, congestion, and some discomfort.  No fever or purulent drainage.  Past Medical History  Diagnosis Date  . Allergy     Social History   Social History  . Marital Status: Married    Spouse Name: N/A  . Number of Children: N/A  . Years of Education: N/A   Occupational History  . Not on file.   Social History Main Topics  . Smoking status: Never Smoker   . Smokeless tobacco: Current User    Types: Chew     Comment: NEVER USED TOBACCO  . Alcohol Use: 0.0 oz/week    0 Standard drinks or equivalent per week  . Drug Use: No  . Sexual Activity: Not on file   Other Topics Concern  . Not on file   Social History Narrative    Past Surgical History  Procedure Laterality Date  . Tonsillectomy      Family History  Problem Relation Age of Onset  . Heart disease Father   . Hyperlipidemia Father   . Hypertension Father   . Heart disease Paternal Uncle   . Cancer Paternal Grandmother     breast    Allergies  Allergen Reactions  . Fexofenadine     No current outpatient prescriptions on file prior to visit.   No current facility-administered medications on file prior to visit.    BP 136/90 mmHg  Pulse 62  Temp(Src) 98.3 F (36.8 C) (Oral)  Resp 20  Ht 5\' 9"  (1.753 m)  Wt 172 lb (78.019 kg)  BMI 25.39 kg/m2  SpO2 95%     Review of Systems  Constitutional: Negative for fever, chills, appetite change and fatigue.  HENT: Positive for congestion and sinus pressure. Negative for dental problem, ear pain, hearing loss, sore throat, tinnitus, trouble swallowing and voice change.   Eyes: Negative  for pain, discharge and visual disturbance.  Respiratory: Negative for cough, chest tightness, wheezing and stridor.   Cardiovascular: Negative for chest pain, palpitations and leg swelling.  Gastrointestinal: Negative for nausea, vomiting, abdominal pain, diarrhea, constipation, blood in stool and abdominal distention.  Genitourinary: Negative for urgency, hematuria, flank pain, discharge, difficulty urinating and genital sores.  Musculoskeletal: Negative for myalgias, back pain, joint swelling, arthralgias, gait problem and neck stiffness.  Skin: Negative for rash.  Neurological: Negative for dizziness, syncope, speech difficulty, weakness, numbness and headaches.  Hematological: Negative for adenopathy. Does not bruise/bleed easily.  Psychiatric/Behavioral: Negative for behavioral problems and dysphoric mood. The patient is not nervous/anxious.        Objective:   Physical Exam  Constitutional: He is oriented to person, place, and time. He appears well-developed.  Repeat blood pressure 120/75  HENT:  Head: Normocephalic.  Right Ear: External ear normal.  Left Ear: External ear normal.  Very mild tenderness in the maxillary sinus areas bilaterally  Eyes: Conjunctivae and EOM are normal.  Neck: Normal range of motion.  Cardiovascular: Normal rate and normal heart sounds.   Pulmonary/Chest: Breath sounds normal.  Abdominal: Bowel sounds are normal.  Musculoskeletal: Normal range of motion. He exhibits no  edema or tenderness.  Neurological: He is alert and oriented to person, place, and time.  Psychiatric: He has a normal mood and affect. His behavior is normal.          Assessment & Plan:   Allergic rhinitis.  Flare.  Patient often has difficulties this time of year with the change in weather.  Doubt acute bacterial sinusitis.  Will start more aggressive saline irrigation decongestants and 6 is on nasal spray.  Will treat with a brief oral prednisone regimen for 5 days

## 2015-06-18 NOTE — Patient Instructions (Signed)
Acute sinusitis symptoms  are generally not helped by antibiotic therapy.  Use saline irrigation, warm  moist compresses and over-the-counter decongestants only as directed.  Call if there is no improvement in 5 to 7 days, or sooner if you develop increasing pain, fever, or any new symptoms.  HOME CARE INSTRUCTIONS  Drink plenty of water. Water helps thin the mucus so your sinuses can drain more easily.  Use a humidifier.  Inhale steam 3-4 times a day (for example, sit in the bathroom with the shower running).  Apply a warm, moist washcloth to your face 3-4 times a day, or as directed by your health care provider.  Use saline nasal sprays to help moisten and clean your sinuses.  Take medicines only as directed by your health care provider.

## 2015-06-18 NOTE — Progress Notes (Signed)
Pre visit review using our clinic review tool, if applicable. No additional management support is needed unless otherwise documented below in the visit note. 

## 2015-06-24 ENCOUNTER — Other Ambulatory Visit: Payer: Self-pay | Admitting: Internal Medicine

## 2015-06-24 ENCOUNTER — Encounter: Payer: Self-pay | Admitting: Internal Medicine

## 2015-06-24 ENCOUNTER — Other Ambulatory Visit: Payer: Self-pay | Admitting: *Deleted

## 2015-06-24 MED ORDER — PREDNISONE 20 MG PO TABS: 20.0000 mg | ORAL_TABLET | Freq: Two times a day (BID) | ORAL | Status: DC

## 2015-07-14 ENCOUNTER — Other Ambulatory Visit (HOSPITAL_BASED_OUTPATIENT_CLINIC_OR_DEPARTMENT_OTHER): Payer: 59

## 2015-07-14 DIAGNOSIS — D693 Immune thrombocytopenic purpura: Secondary | ICD-10-CM | POA: Diagnosis not present

## 2015-07-14 LAB — CBC WITH DIFFERENTIAL/PLATELET
BASO%: 0 % (ref 0.0–2.0)
BASOS ABS: 0 10*3/uL (ref 0.0–0.1)
EOS ABS: 0.1 10*3/uL (ref 0.0–0.5)
EOS%: 2.2 % (ref 0.0–7.0)
HEMATOCRIT: 39.6 % (ref 38.4–49.9)
HGB: 14.2 g/dL (ref 13.0–17.1)
LYMPH#: 1.4 10*3/uL (ref 0.9–3.3)
LYMPH%: 21.4 % (ref 14.0–49.0)
MCH: 30.8 pg (ref 27.2–33.4)
MCHC: 35.9 g/dL (ref 32.0–36.0)
MCV: 85.9 fL (ref 79.3–98.0)
MONO#: 0.5 10*3/uL (ref 0.1–0.9)
MONO%: 7.1 % (ref 0.0–14.0)
NEUT#: 4.5 10*3/uL (ref 1.5–6.5)
NEUT%: 69.3 % (ref 39.0–75.0)
PLATELETS: 123 10*3/uL — AB (ref 140–400)
RBC: 4.61 10*6/uL (ref 4.20–5.82)
RDW: 11.9 % (ref 11.0–14.6)
WBC: 6.5 10*3/uL (ref 4.0–10.3)

## 2015-07-28 ENCOUNTER — Ambulatory Visit (INDEPENDENT_AMBULATORY_CARE_PROVIDER_SITE_OTHER): Payer: 59 | Admitting: Family Medicine

## 2015-07-28 ENCOUNTER — Encounter: Payer: Self-pay | Admitting: Family Medicine

## 2015-07-28 VITALS — BP 130/85 | HR 64 | Ht 69.5 in | Wt 174.0 lb

## 2015-07-28 DIAGNOSIS — D693 Immune thrombocytopenic purpura: Secondary | ICD-10-CM | POA: Diagnosis not present

## 2015-07-28 DIAGNOSIS — Z0000 Encounter for general adult medical examination without abnormal findings: Secondary | ICD-10-CM

## 2015-07-28 LAB — CBC
HEMATOCRIT: 41.3 % (ref 39.0–52.0)
HEMOGLOBIN: 14.7 g/dL (ref 13.0–17.0)
MCH: 30.7 pg (ref 26.0–34.0)
MCHC: 35.6 g/dL (ref 30.0–36.0)
MCV: 86.2 fL (ref 78.0–100.0)
MPV: 10.3 fL (ref 8.6–12.4)
Platelets: 116 10*3/uL — ABNORMAL LOW (ref 150–400)
RBC: 4.79 MIL/uL (ref 4.22–5.81)
RDW: 12.8 % (ref 11.5–15.5)
WBC: 6.3 10*3/uL (ref 4.0–10.5)

## 2015-07-28 LAB — LIPID PANEL
CHOL/HDL RATIO: 3.7 ratio (ref <=5.0)
Cholesterol: 159 mg/dL (ref 125–200)
HDL: 43 mg/dL (ref >=40)
LDL CALC: 93 mg/dL (ref <130)
TRIGLYCERIDES: 114 mg/dL (ref <150)
VLDL: 23 mg/dL (ref <30)

## 2015-07-28 LAB — COMPLETE METABOLIC PANEL WITH GFR
ALT: 50 U/L — ABNORMAL HIGH (ref 9–46)
AST: 39 U/L (ref 10–40)
Albumin: 4.8 g/dL (ref 3.6–5.1)
Alkaline Phosphatase: 66 U/L (ref 40–115)
BILIRUBIN TOTAL: 0.9 mg/dL (ref 0.2–1.2)
BUN: 16 mg/dL (ref 7–25)
CHLORIDE: 103 mmol/L (ref 98–110)
CO2: 31 mmol/L (ref 20–31)
Calcium: 9.3 mg/dL (ref 8.6–10.3)
Creat: 1.21 mg/dL (ref 0.60–1.35)
GFR, EST AFRICAN AMERICAN: 88 mL/min (ref >=60)
GFR, EST NON AFRICAN AMERICAN: 76 mL/min (ref >=60)
Glucose, Bld: 80 mg/dL (ref 65–99)
POTASSIUM: 3.7 mmol/L (ref 3.5–5.3)
Sodium: 141 mmol/L (ref 135–146)
Total Protein: 6.6 g/dL (ref 6.1–8.1)

## 2015-07-28 NOTE — Progress Notes (Signed)
CC: Robert Young is a 39 y.o. male is here for Establish Care   Subjective: HPI:  Colonoscopy: no current indication Prostate: Discussed screening risks/beneifts with patient today, no need for screening  Influenza Vaccine: UTD Pneumovax: no current indication  Td/Tdap: UTD until 2019 Zoster: No current indication.  Left groin pain for three weeks, only felt when doing crunches, nothing else seems to make better or worse. Mild in severity and nonradiating. Denies pain with sneezing or coughing or defecation.  Review of Systems - General ROS: negative for - chills, fever, night sweats, weight gain or weight loss Ophthalmic ROS: negative for - decreased vision Psychological ROS: negative for - anxiety or depression ENT ROS: negative for - hearing change, nasal congestion, tinnitus or allergies Hematological and Lymphatic ROS: negative for - bleeding problems, bruising or swollen lymph nodes Breast ROS: negative Respiratory ROS: no cough, shortness of breath, or wheezing Cardiovascular ROS: no chest pain or dyspnea on exertion Gastrointestinal ROS: no abdominal pain, change in bowel habits, or black or bloody stools Genito-Urinary ROS: negative for - genital discharge, genital ulcers, incontinence or abnormal bleeding from genitals Musculoskeletal ROS: negative for - joint pain or muscle pain other than that described above Neurological ROS: negative for - headaches or memory loss Dermatological ROS: negative for lumps, mole changes, rash and skin lesion changes  Past Medical History  Diagnosis Date  . Allergy     Past Surgical History  Procedure Laterality Date  . Tonsillectomy     Family History  Problem Relation Age of Onset  . Heart disease Father   . Hyperlipidemia Father   . Hypertension Father   . Stroke Father   . Heart disease Paternal Uncle   . Cancer Paternal Grandmother     breast    Social History   Social History  . Marital Status: Married    Spouse  Name: N/A  . Number of Children: N/A  . Years of Education: N/A   Occupational History  . Not on file.   Social History Main Topics  . Smoking status: Never Smoker   . Smokeless tobacco: Former Systems developer    Types: Chew    Quit date: 07/27/2008     Comment: NEVER USED TOBACCO  . Alcohol Use: 0.0 oz/week    0 Standard drinks or equivalent per week  . Drug Use: No  . Sexual Activity: Yes    Birth Control/ Protection: None   Other Topics Concern  . Not on file   Social History Narrative     Objective: BP 130/85 mmHg  Pulse 64  Ht 5' 9.5" (1.765 m)  Wt 174 lb (78.926 kg)  BMI 25.34 kg/m2  General: No Acute Distress HEENT: Atraumatic, normocephalic, conjunctivae normal without scleral icterus.  No nasal discharge, hearing grossly intact, TMs with good landmarks bilaterally with no middle ear abnormalities, posterior pharynx clear without oral lesions. Neck: Supple, trachea midline, no cervical nor supraclavicular adenopathy. Pulmonary: Clear to auscultation bilaterally without wheezing, rhonchi, nor rales. Cardiac: Regular rate and rhythm.  No murmurs, rubs, nor gallops. No peripheral edema.  2+ peripheral pulses bilaterally. Abdomen: Bowel sounds normal.  No masses.  Non-tender without rebound.  Negative Murphy's sign. MSK: Grossly intact, no signs of weakness.  Full strength throughout upper and lower extremities.  Full ROM in upper and lower extremities.  No midline spinal tenderness. Neuro: Gait unremarkable, CN II-XII grossly intact.  C5-C6 Reflex 2/4 Bilaterally, L4 Reflex 2/4 Bilaterally.  Cerebellar function intact. Skin: No rashes. Psych:  Alert and oriented to person/place/time.  Thought process normal. No anxiety/depression.  Assessment & Plan: Robert Young was seen today for establish care.  Diagnoses and all orders for this visit:  ITP (idiopathic thrombocytopenic purpura) -     CBC  Annual physical exam -     Lipid panel -     COMPLETE METABOLIC PANEL WITH GFR -      CBC   Healthy lifestyle interventions including but not limited to regular exercise, a healthy low fat diet, moderation of salt intake, the dangers of tobacco/alcohol/recreational drug use, nutrition supplementation, and accident avoidance were discussed with the patient and a handout was provided for future reference.  Provided with home rehabilitation plan to help with suspected left hip flexor strain.   Return in about 1 year (around 07/27/2016) for annual physical.

## 2015-08-11 ENCOUNTER — Other Ambulatory Visit (HOSPITAL_BASED_OUTPATIENT_CLINIC_OR_DEPARTMENT_OTHER): Payer: 59

## 2015-08-11 DIAGNOSIS — D693 Immune thrombocytopenic purpura: Secondary | ICD-10-CM | POA: Diagnosis not present

## 2015-08-11 LAB — CBC WITH DIFFERENTIAL/PLATELET
BASO%: 0.2 % (ref 0.0–2.0)
BASOS ABS: 0 10*3/uL (ref 0.0–0.1)
EOS%: 7 % (ref 0.0–7.0)
Eosinophils Absolute: 0.4 10*3/uL (ref 0.0–0.5)
HCT: 38.8 % (ref 38.4–49.9)
HGB: 13.9 g/dL (ref 13.0–17.1)
LYMPH#: 1.4 10*3/uL (ref 0.9–3.3)
LYMPH%: 24 % (ref 14.0–49.0)
MCH: 30.5 pg (ref 27.2–33.4)
MCHC: 35.8 g/dL (ref 32.0–36.0)
MCV: 85.3 fL (ref 79.3–98.0)
MONO#: 0.5 10*3/uL (ref 0.1–0.9)
MONO%: 7.7 % (ref 0.0–14.0)
NEUT#: 3.7 10*3/uL (ref 1.5–6.5)
NEUT%: 61.1 % (ref 39.0–75.0)
Platelets: 72 10*3/uL — ABNORMAL LOW (ref 140–400)
RBC: 4.55 10*6/uL (ref 4.20–5.82)
RDW: 12.2 % (ref 11.0–14.6)
WBC: 6 10*3/uL (ref 4.0–10.3)
nRBC: 0 % (ref 0–0)

## 2015-08-13 ENCOUNTER — Telehealth: Payer: Self-pay | Admitting: Hematology & Oncology

## 2015-08-13 ENCOUNTER — Other Ambulatory Visit: Payer: Self-pay | Admitting: Hematology & Oncology

## 2015-08-13 DIAGNOSIS — D693 Immune thrombocytopenic purpura: Secondary | ICD-10-CM

## 2015-08-13 MED ORDER — FAMCICLOVIR 500 MG PO TABS: 500.0000 mg | ORAL_TABLET | Freq: Every day | ORAL | Status: DC

## 2015-08-13 NOTE — Telephone Encounter (Signed)
I talked to Robert Young today. He is pleasant 72,000. I Think It Is Clear That His Immune Thrombocytopenia Is Trying to Come Back.  I Told Him That He Probably Go Back onto Prednisone. I Want Him Back on 80 Mg a Day of Prednisone.  I called in a refill for his Famvir to take.  We will check his CBC on 12/23. If his platelet count is better, then we will decrease his dose of prednisone. He will be going out of town between [place] and [place].  I will plan to see him back in the office the first week in January.  I told him that he can always call me if he has any problems.  Robert Young

## 2015-08-22 ENCOUNTER — Other Ambulatory Visit (HOSPITAL_BASED_OUTPATIENT_CLINIC_OR_DEPARTMENT_OTHER): Payer: 59

## 2015-08-22 DIAGNOSIS — D693 Immune thrombocytopenic purpura: Secondary | ICD-10-CM

## 2015-08-22 LAB — CBC WITH DIFFERENTIAL/PLATELET
BASO%: 0 % (ref 0.0–2.0)
Basophils Absolute: 0 10*3/uL (ref 0.0–0.1)
EOS%: 0.1 % (ref 0.0–7.0)
Eosinophils Absolute: 0 10*3/uL (ref 0.0–0.5)
HCT: 43.5 % (ref 38.4–49.9)
HGB: 15.3 g/dL (ref 13.0–17.1)
LYMPH%: 10.3 % — AB (ref 14.0–49.0)
MCH: 30.5 pg (ref 27.2–33.4)
MCHC: 35.2 g/dL (ref 32.0–36.0)
MCV: 86.8 fL (ref 79.3–98.0)
MONO#: 0.2 10*3/uL (ref 0.1–0.9)
MONO%: 2.3 % (ref 0.0–14.0)
NEUT%: 87.3 % — ABNORMAL HIGH (ref 39.0–75.0)
NEUTROS ABS: 6.3 10*3/uL (ref 1.5–6.5)
NRBC: 0 % (ref 0–0)
Platelets: 167 10*3/uL (ref 140–400)
RBC: 5.01 10*6/uL (ref 4.20–5.82)
RDW: 12.4 % (ref 11.0–14.6)
WBC: 7.3 10*3/uL (ref 4.0–10.3)
lymph#: 0.8 10*3/uL — ABNORMAL LOW (ref 0.9–3.3)

## 2015-09-08 ENCOUNTER — Other Ambulatory Visit (HOSPITAL_BASED_OUTPATIENT_CLINIC_OR_DEPARTMENT_OTHER): Payer: 59

## 2015-09-08 DIAGNOSIS — D693 Immune thrombocytopenic purpura: Secondary | ICD-10-CM

## 2015-09-08 LAB — CBC WITH DIFFERENTIAL/PLATELET
BASO%: 0.1 % (ref 0.0–2.0)
Basophils Absolute: 0 10*3/uL (ref 0.0–0.1)
EOS%: 0 % (ref 0.0–7.0)
Eosinophils Absolute: 0 10*3/uL (ref 0.0–0.5)
HEMATOCRIT: 45.7 % (ref 38.4–49.9)
HGB: 15.4 g/dL (ref 13.0–17.1)
LYMPH#: 0.7 10*3/uL — AB (ref 0.9–3.3)
LYMPH%: 6.9 % — ABNORMAL LOW (ref 14.0–49.0)
MCH: 29.7 pg (ref 27.2–33.4)
MCHC: 33.8 g/dL (ref 32.0–36.0)
MCV: 88 fL (ref 79.3–98.0)
MONO#: 0.2 10*3/uL (ref 0.1–0.9)
MONO%: 1.9 % (ref 0.0–14.0)
NEUT#: 9.7 10*3/uL — ABNORMAL HIGH (ref 1.5–6.5)
NEUT%: 91.1 % — AB (ref 39.0–75.0)
Platelets: 139 10*3/uL — ABNORMAL LOW (ref 140–400)
RBC: 5.2 10*6/uL (ref 4.20–5.82)
RDW: 13.1 % (ref 11.0–14.6)
WBC: 10.6 10*3/uL — ABNORMAL HIGH (ref 4.0–10.3)

## 2015-09-08 LAB — COMPREHENSIVE METABOLIC PANEL
ALBUMIN: 4.5 g/dL (ref 3.5–5.0)
ALK PHOS: 46 U/L (ref 40–150)
ALT: 21 U/L (ref 0–55)
ANION GAP: 9 meq/L (ref 3–11)
AST: 18 U/L (ref 5–34)
BILIRUBIN TOTAL: 0.85 mg/dL (ref 0.20–1.20)
BUN: 17 mg/dL (ref 7.0–26.0)
CO2: 29 mEq/L (ref 22–29)
Calcium: 9.2 mg/dL (ref 8.4–10.4)
Chloride: 102 mEq/L (ref 98–109)
Creatinine: 1.3 mg/dL (ref 0.7–1.3)
EGFR: 73 mL/min/{1.73_m2} — AB (ref >90)
GLUCOSE: 106 mg/dL (ref 70–140)
Potassium: 3.8 mEq/L (ref 3.5–5.1)
Sodium: 140 mEq/L (ref 136–145)
TOTAL PROTEIN: 6.9 g/dL (ref 6.4–8.3)

## 2015-09-10 ENCOUNTER — Other Ambulatory Visit (HOSPITAL_BASED_OUTPATIENT_CLINIC_OR_DEPARTMENT_OTHER): Payer: 59

## 2015-09-10 ENCOUNTER — Encounter: Payer: Self-pay | Admitting: Hematology & Oncology

## 2015-09-10 ENCOUNTER — Ambulatory Visit (HOSPITAL_BASED_OUTPATIENT_CLINIC_OR_DEPARTMENT_OTHER): Payer: 59 | Admitting: Hematology & Oncology

## 2015-09-10 VITALS — BP 146/84 | HR 73 | Temp 98.4°F | Resp 18 | Ht 69.0 in | Wt 174.0 lb

## 2015-09-10 DIAGNOSIS — D693 Immune thrombocytopenic purpura: Secondary | ICD-10-CM

## 2015-09-10 LAB — CBC WITH DIFFERENTIAL (CANCER CENTER ONLY)
BASO#: 0 10*3/uL (ref 0.0–0.2)
BASO%: 0 % (ref 0.0–2.0)
EOS ABS: 0 10*3/uL (ref 0.0–0.5)
EOS%: 0 % (ref 0.0–7.0)
HCT: 43.9 % (ref 38.7–49.9)
HEMOGLOBIN: 15.1 g/dL (ref 13.0–17.1)
LYMPH#: 0.8 10*3/uL — ABNORMAL LOW (ref 0.9–3.3)
LYMPH%: 8.6 % — ABNORMAL LOW (ref 14.0–48.0)
MCH: 30 pg (ref 28.0–33.4)
MCHC: 34.4 g/dL (ref 32.0–35.9)
MCV: 87 fL (ref 82–98)
MONO#: 0.2 10*3/uL (ref 0.1–0.9)
MONO%: 1.9 % (ref 0.0–13.0)
NEUT%: 89.5 % — ABNORMAL HIGH (ref 40.0–80.0)
NEUTROS ABS: 8.4 10*3/uL — AB (ref 1.5–6.5)
Platelets: 146 10*3/uL (ref 145–400)
RBC: 5.03 10*6/uL (ref 4.20–5.70)
RDW: 12.4 % (ref 11.1–15.7)
WBC: 9.3 10*3/uL (ref 4.0–10.0)

## 2015-09-10 MED ORDER — PREDNISONE 20 MG PO TABS
ORAL_TABLET | ORAL | Status: DC
Start: 2015-09-10 — End: 2016-01-14

## 2015-09-10 MED FILL — predniSONE 20 MG TABS: 20 | 60 days supply | Qty: 120 | Fill #0

## 2015-09-10 NOTE — Progress Notes (Signed)
Hematology and Oncology Follow Up Visit  Robert Young [id number] [birth year] 40 y.o. 09/10/2015   Principle Diagnosis:  Chronic immune thrombocytopenia   Current Therapy:   Prednisone 40 mg po q day    Interim History:  Robert Young is here today for a follow-up. He relapsed with the ITP. We got back onto prednisone. I started him back on 80 mg a day. We have been able to taper him down.  He is a well with the prednisone. He really has had very little way of side effects.  He is still working. He's had no bleeding. He's had no rashes. He's had no joint swelling.  His appetite is good. His blood pressures up a little bit. I think this probably is from the prednisone.  He currently is on 60 mg a day.  Overall, his performance status is ECOG 0. .   Medications:    Medication List       This list is accurate as of: 09/10/15  2:43 PM.  Always use your most recent med list.               famciclovir 500 MG tablet  Commonly known as:  FAMVIR  Take 1 tablet (500 mg total) by mouth daily.     predniSONE 20 MG tablet  Commonly known as:  DELTASONE  Take 2 pills per day with food.        Allergies:  Allergies  Allergen Reactions  . Zyrtec [Cetirizine]     BP elevated    Past Medical History, Surgical history, Social history, and Family History were reviewed and updated.  Review of Systems: All other 10 point review of systems is negative.   Physical Exam:  height is 5\' 9"  (1.753 m) and weight is 174 lb (78.926 kg). His oral temperature is 98.4 F (36.9 C). His blood pressure is 146/84 and his pulse is 73. His respiration is 18.   Wt Readings from Last 3 Encounters:  09/10/15 174 lb (78.926 kg)  07/28/15 174 lb (78.926 kg)  06/18/15 172 lb (78.019 kg)    Head and neck exam shows no ocular or oral lesion. There are no palpable cervical or supraclavicular lymph nodes. Lungs are clear. Cardiac exam regular rate and rhythm with no murmurs, rubs or bruits. Abdomen is  soft. He has good bowel sounds. There is no fluid wave. There is no palpable liver or spleen tip. Back exam shows no tenderness over the spine, ribs or hips. Extremities shows no clubbing, cyanosis or edema. Skin exam shows no rashes, ecchymoses or petechia. Neurological exam shows no focal neurological deficits.   Lab Results  Component Value Date   WBC 9.3 09/10/2015   HGB 15.1 09/10/2015   HCT 43.9 09/10/2015   MCV 87 09/10/2015   PLT 146 09/10/2015   No results found for: FERRITIN, IRON, TIBC, UIBC, IRONPCTSAT Lab Results  Component Value Date   RBC 5.03 09/10/2015   No results found for: KPAFRELGTCHN, LAMBDASER, KAPLAMBRATIO No results found for: IGGSERUM, IGA, IGMSERUM No results found for: Odetta Pink, SPEI   Chemistry      Component Value Date/Time   NA 140 09/08/2015 1051   NA 141 07/28/2015 0920   K 3.8 09/08/2015 1051   K 3.7 07/28/2015 0920   CL 103 07/28/2015 0920   CO2 29 09/08/2015 1051   CO2 31 07/28/2015 0920   BUN 17.0 09/08/2015 1051   BUN 16 07/28/2015 0920  CREATININE 1.3 09/08/2015 1051   CREATININE 1.21 07/28/2015 0920   CREATININE 1.16 01/14/2015 0934      Component Value Date/Time   CALCIUM 9.2 09/08/2015 1051   CALCIUM 9.3 07/28/2015 0920   ALKPHOS 46 09/08/2015 1051   ALKPHOS 66 07/28/2015 0920   AST 18 09/08/2015 1051   AST 39 07/28/2015 0920   ALT 21 09/08/2015 1051   ALT 50* 07/28/2015 0920   BILITOT 0.85 09/08/2015 1051   BILITOT 0.9 07/28/2015 0920     Impression and Plan: Robert Young is a 39 year old white male with chronic immune thrombocytopenia. He responded well to prednisone. He subsequently relapsed. He has responded again.  We will decrease his prednisone dose down to 40 mg day. I think this would be very reasonable. I will keep him on 40 mg for the next several weeks.  I will then check a CBC on him in 1 month. This will really let us know how he is doing.  I taught him  about the possibility of him having a splenectomy. I think this would be most reasonable. I think since he has responded to prednisone, a splenectomy should have about a 85% effectiveness rate.  I will plan to see back myself probably in about 2 months. He will have blood work done in one month.   Volanda Napoleon, MD 1/11/20172:43 PM

## 2015-09-11 MED FILL — FAMCICLOVIR 500 MG TABLET: 500 | 30 days supply | Qty: 30 | Fill #1

## 2015-10-06 ENCOUNTER — Encounter: Payer: Self-pay | Admitting: Hematology & Oncology

## 2015-10-07 ENCOUNTER — Telehealth: Payer: Self-pay | Admitting: Hematology & Oncology

## 2015-10-07 NOTE — Telephone Encounter (Signed)
Called patient's home # and left a message notifying patient that 10/08/2015 Lab Appt has been moved to [facility] as patient requested.       [facility].

## 2015-10-08 ENCOUNTER — Other Ambulatory Visit (HOSPITAL_BASED_OUTPATIENT_CLINIC_OR_DEPARTMENT_OTHER): Payer: 59

## 2015-10-08 ENCOUNTER — Other Ambulatory Visit: Payer: 59

## 2015-10-08 DIAGNOSIS — D693 Immune thrombocytopenic purpura: Secondary | ICD-10-CM | POA: Diagnosis not present

## 2015-10-08 LAB — CBC WITH DIFFERENTIAL/PLATELET
BASO%: 0 % (ref 0.0–2.0)
Basophils Absolute: 0 10*3/uL (ref 0.0–0.1)
EOS%: 0.1 % (ref 0.0–7.0)
Eosinophils Absolute: 0 10*3/uL (ref 0.0–0.5)
HCT: 41.2 % (ref 38.4–49.9)
HGB: 14.5 g/dL (ref 13.0–17.1)
LYMPH%: 7.5 % — AB (ref 14.0–49.0)
MCH: 31 pg (ref 27.2–33.4)
MCHC: 35.2 g/dL (ref 32.0–36.0)
MCV: 88 fL (ref 79.3–98.0)
MONO#: 0.7 10*3/uL (ref 0.1–0.9)
MONO%: 5.4 % (ref 0.0–14.0)
NEUT%: 87 % — ABNORMAL HIGH (ref 39.0–75.0)
NEUTROS ABS: 11 10*3/uL — AB (ref 1.5–6.5)
Platelets: 148 10*3/uL (ref 140–400)
RBC: 4.68 10*6/uL (ref 4.20–5.82)
RDW: 12.7 % (ref 11.0–14.6)
WBC: 12.6 10*3/uL — AB (ref 4.0–10.3)
lymph#: 1 10*3/uL (ref 0.9–3.3)

## 2015-10-16 MED FILL — FAMCICLOVIR 500 MG TABLET: 500 | 30 days supply | Qty: 30 | Fill #2

## 2015-11-07 ENCOUNTER — Encounter: Payer: Self-pay | Admitting: Hematology & Oncology

## 2015-11-07 ENCOUNTER — Other Ambulatory Visit (HOSPITAL_BASED_OUTPATIENT_CLINIC_OR_DEPARTMENT_OTHER): Payer: 59

## 2015-11-07 ENCOUNTER — Ambulatory Visit (HOSPITAL_BASED_OUTPATIENT_CLINIC_OR_DEPARTMENT_OTHER): Payer: 59 | Admitting: Hematology & Oncology

## 2015-11-07 VITALS — BP 146/77 | HR 73 | Temp 98.1°F | Resp 18 | Ht 69.0 in | Wt 174.0 lb

## 2015-11-07 DIAGNOSIS — D693 Immune thrombocytopenic purpura: Secondary | ICD-10-CM | POA: Diagnosis not present

## 2015-11-07 LAB — CBC WITH DIFFERENTIAL (CANCER CENTER ONLY)
BASO#: 0 10*3/uL (ref 0.0–0.2)
BASO%: 0.1 % (ref 0.0–2.0)
EOS ABS: 0 10*3/uL (ref 0.0–0.5)
EOS%: 0 % (ref 0.0–7.0)
HEMATOCRIT: 43.4 % (ref 38.7–49.9)
HEMOGLOBIN: 15.6 g/dL (ref 13.0–17.1)
LYMPH#: 0.7 10*3/uL — ABNORMAL LOW (ref 0.9–3.3)
LYMPH%: 7.8 % — ABNORMAL LOW (ref 14.0–48.0)
MCH: 31.6 pg (ref 28.0–33.4)
MCHC: 35.9 g/dL (ref 32.0–35.9)
MCV: 88 fL (ref 82–98)
MONO#: 0.2 10*3/uL (ref 0.1–0.9)
MONO%: 2.2 % (ref 0.0–13.0)
NEUT%: 89.9 % — AB (ref 40.0–80.0)
NEUTROS ABS: 8.2 10*3/uL — AB (ref 1.5–6.5)
Platelets: 163 10*3/uL (ref 145–400)
RBC: 4.94 10*6/uL (ref 4.20–5.70)
RDW: 12.6 % (ref 11.1–15.7)
WBC: 9.2 10*3/uL (ref 4.0–10.0)

## 2015-11-07 LAB — COMPREHENSIVE METABOLIC PANEL
ALT: 14 U/L (ref 0–55)
ANION GAP: 12 meq/L — AB (ref 3–11)
AST: 14 U/L (ref 5–34)
Albumin: 4.5 g/dL (ref 3.5–5.0)
Alkaline Phosphatase: 44 U/L (ref 40–150)
BUN: 19.6 mg/dL (ref 7.0–26.0)
CALCIUM: 9.3 mg/dL (ref 8.4–10.4)
CO2: 25 meq/L (ref 22–29)
Chloride: 103 mEq/L (ref 98–109)
Creatinine: 1.3 mg/dL (ref 0.7–1.3)
EGFR: 71 mL/min/{1.73_m2} — AB (ref >90)
Glucose: 153 mg/dl — ABNORMAL HIGH (ref 70–140)
Potassium: 4 mEq/L (ref 3.5–5.1)
Sodium: 140 mEq/L (ref 136–145)
TOTAL PROTEIN: 6.8 g/dL (ref 6.4–8.3)
Total Bilirubin: 0.82 mg/dL (ref 0.20–1.20)

## 2015-11-07 LAB — CHCC SATELLITE - SMEAR

## 2015-11-07 LAB — LACTATE DEHYDROGENASE: LDH: 175 U/L (ref 125–245)

## 2015-11-07 NOTE — Progress Notes (Signed)
Hematology and Oncology Follow Up Visit  Robert Young [id number] 1976/07/31 40 y.o. 11/07/2015   Principle Diagnosis:  Chronic immune thrombocytopenia   Current Therapy:   Prednisone 20mg  po q day - start today    Interim History:  Robert Young is here today for a follow-up. He has relapsed  ITP. We got him back onto prednisone. I started him back on 80 mg a day. We have been able to taper him down.  He is doing well with the prednisone. He really has had very little way of side effects.  He is still working. He's had no bleeding. He's had no rashes. He's had no joint swelling.  His appetite is good. His blood pressures up a little bit. I think this probably is from the prednisone.  He currently is on 40 mg a day alternating with 20 days mg a day.  Overall, his performance status is ECOG 0. .   Medications:    Medication List       This list is accurate as of: 11/07/15 12:14 PM.  Always use your most recent med list.               famciclovir 500 MG tablet  Commonly known as:  FAMVIR  Take 1 tablet (500 mg total) by mouth daily.     predniSONE 20 MG tablet  Commonly known as:  DELTASONE  Take 2 pills per day with food.        Allergies:  Allergies  Allergen Reactions  . Zyrtec [Cetirizine]     BP elevated    Past Medical History, Surgical history, Social history, and Family History were reviewed and updated.  Review of Systems: All other 10 point review of systems is negative.   Physical Exam:  height is 5\' 9"  (1.753 m) and weight is 174 lb (78.926 kg). His oral temperature is 98.1 F (36.7 C). His blood pressure is 146/77 and his pulse is 73. His respiration is 18.   Wt Readings from Last 3 Encounters:  11/07/15 174 lb (78.926 kg)  09/10/15 174 lb (78.926 kg)  07/28/15 174 lb (78.926 kg)    Head and neck exam shows no ocular or oral lesion. There are no palpable cervical or supraclavicular lymph nodes. Lungs are clear. Cardiac exam regular rate and  rhythm with no murmurs, rubs or bruits. Abdomen is soft. He has good bowel sounds. There is no fluid wave. There is no palpable liver or spleen tip. Back exam shows no tenderness over the spine, ribs or hips. Extremities shows no clubbing, cyanosis or edema. Skin exam shows no rashes, ecchymoses or petechia. Neurological exam shows no focal neurological deficits.   Lab Results  Component Value Date   WBC 9.2 11/07/2015   HGB 15.6 11/07/2015   HCT 43.4 11/07/2015   MCV 88 11/07/2015   PLT 163 11/07/2015   No results found for: FERRITIN, IRON, TIBC, UIBC, IRONPCTSAT Lab Results  Component Value Date   RBC 4.94 11/07/2015   No results found for: KPAFRELGTCHN, LAMBDASER, KAPLAMBRATIO No results found for: IGGSERUM, IGA, IGMSERUM No results found for: Odetta Pink, SPEI   Chemistry      Component Value Date/Time   NA 140 09/08/2015 1051   NA 141 07/28/2015 0920   K 3.8 09/08/2015 1051   K 3.7 07/28/2015 0920   CL 103 07/28/2015 0920   CO2 29 09/08/2015 1051   CO2 31 07/28/2015 0920   BUN 17.0 09/08/2015  1051   BUN 16 07/28/2015 0920   CREATININE 1.3 09/08/2015 1051   CREATININE 1.21 07/28/2015 0920   CREATININE 1.16 01/14/2015 0934      Component Value Date/Time   CALCIUM 9.2 09/08/2015 1051   CALCIUM 9.3 07/28/2015 0920   ALKPHOS 46 09/08/2015 1051   ALKPHOS 66 07/28/2015 0920   AST 18 09/08/2015 1051   AST 39 07/28/2015 0920   ALT 21 09/08/2015 1051   ALT 50* 07/28/2015 0920   BILITOT 0.85 09/08/2015 1051   BILITOT 0.9 07/28/2015 0920     Impression and Plan: Robert Young is a 40 year old white male with chronic immune thrombocytopenia. He responded well to prednisone. He subsequently relapsed. He has responded again.  We will decrease his prednisone dose down to 20 mg day. I think this would be very reasonable. I will keep him on 20 mg for the next several weeks.  I will then check a CBC on him in 1 month. This will  really let us know how he is doing.  If, in 1 month, his blood count is stable, then we will decrease down to 20 mg a day alternating with 10 mg daily.   I will plan to see back myself probably in about 2 months. He will have blood work done in one month.   Volanda Napoleon, MD 3/10/201712:14 PM

## 2015-11-18 MED FILL — FAMCICLOVIR 500 MG TABLET: 500 | 30 days supply | Qty: 30 | Fill #3

## 2015-11-18 MED FILL — predniSONE 20 MG TABS: 20 | 60 days supply | Qty: 120 | Fill #1

## 2015-12-11 ENCOUNTER — Other Ambulatory Visit (HOSPITAL_BASED_OUTPATIENT_CLINIC_OR_DEPARTMENT_OTHER): Payer: 59

## 2015-12-11 DIAGNOSIS — D693 Immune thrombocytopenic purpura: Secondary | ICD-10-CM

## 2015-12-11 LAB — CBC WITH DIFFERENTIAL/PLATELET
BASO%: 0.1 % (ref 0.0–2.0)
Basophils Absolute: 0 10*3/uL (ref 0.0–0.1)
EOS ABS: 0 10*3/uL (ref 0.0–0.5)
EOS%: 0.1 % (ref 0.0–7.0)
HEMATOCRIT: 42.9 % (ref 38.4–49.9)
HEMOGLOBIN: 15 g/dL (ref 13.0–17.1)
LYMPH%: 12.6 % — AB (ref 14.0–49.0)
MCH: 31.2 pg (ref 27.2–33.4)
MCHC: 35 g/dL (ref 32.0–36.0)
MCV: 89.2 fL (ref 79.3–98.0)
MONO#: 0.2 10*3/uL (ref 0.1–0.9)
MONO%: 1.8 % (ref 0.0–14.0)
NEUT%: 85.4 % — ABNORMAL HIGH (ref 39.0–75.0)
NEUTROS ABS: 7.8 10*3/uL — AB (ref 1.5–6.5)
Platelets: 146 10*3/uL (ref 140–400)
RBC: 4.81 10*6/uL (ref 4.20–5.82)
RDW: 12.4 % (ref 11.0–14.6)
WBC: 9.1 10*3/uL (ref 4.0–10.3)
lymph#: 1.2 10*3/uL (ref 0.9–3.3)

## 2015-12-12 ENCOUNTER — Telehealth: Payer: Self-pay | Admitting: Nurse Practitioner

## 2015-12-12 ENCOUNTER — Other Ambulatory Visit: Payer: 59

## 2015-12-12 NOTE — Telephone Encounter (Addendum)
Pt verbalized understanding. He is currently taking 20mg  and is now going down to 10mg . ----- Message from Volanda Napoleon, MD sent at 12/11/2015  2:38 PM EDT ----- Call - platelets are holding steady!!  Whatever dose of prednisone that he is on, decrease by 1/2!!! pete

## 2015-12-16 MED FILL — FAMCICLOVIR 500 MG TABLET: 500 | 30 days supply | Qty: 30 | Fill #4

## 2016-01-09 ENCOUNTER — Other Ambulatory Visit: Payer: 59

## 2016-01-14 ENCOUNTER — Encounter: Payer: Self-pay | Admitting: Hematology & Oncology

## 2016-01-14 ENCOUNTER — Ambulatory Visit (HOSPITAL_BASED_OUTPATIENT_CLINIC_OR_DEPARTMENT_OTHER): Payer: 59 | Admitting: Hematology & Oncology

## 2016-01-14 ENCOUNTER — Other Ambulatory Visit (HOSPITAL_BASED_OUTPATIENT_CLINIC_OR_DEPARTMENT_OTHER): Payer: 59

## 2016-01-14 VITALS — BP 139/80 | HR 18 | Resp 98 | Ht 69.0 in | Wt 175.0 lb

## 2016-01-14 DIAGNOSIS — D693 Immune thrombocytopenic purpura: Secondary | ICD-10-CM

## 2016-01-14 LAB — CBC WITH DIFFERENTIAL/PLATELET
BASO%: 0.1 % (ref 0.0–2.0)
BASOS ABS: 0 10*3/uL (ref 0.0–0.1)
EOS%: 1.9 % (ref 0.0–7.0)
Eosinophils Absolute: 0.2 10*3/uL (ref 0.0–0.5)
HEMATOCRIT: 44 % (ref 38.4–49.9)
HGB: 15.3 g/dL (ref 13.0–17.1)
LYMPH%: 13.9 % — AB (ref 14.0–49.0)
MCH: 30.8 pg (ref 27.2–33.4)
MCHC: 34.8 g/dL (ref 32.0–36.0)
MCV: 88.7 fL (ref 79.3–98.0)
MONO#: 0.4 10*3/uL (ref 0.1–0.9)
MONO%: 4.5 % (ref 0.0–14.0)
NEUT#: 6.8 10*3/uL — ABNORMAL HIGH (ref 1.5–6.5)
NEUT%: 79.6 % — AB (ref 39.0–75.0)
Platelets: 155 10*3/uL (ref 140–400)
RBC: 4.96 10*6/uL (ref 4.20–5.82)
RDW: 12.1 % (ref 11.0–14.6)
WBC: 8.5 10*3/uL (ref 4.0–10.3)
lymph#: 1.2 10*3/uL (ref 0.9–3.3)
nRBC: 0 % (ref 0–0)

## 2016-01-14 LAB — COMPREHENSIVE METABOLIC PANEL
ALK PHOS: 42 U/L (ref 40–150)
ALT: 18 U/L (ref 0–55)
AST: 20 U/L (ref 5–34)
Albumin: 4.7 g/dL (ref 3.5–5.0)
Anion Gap: 9 mEq/L (ref 3–11)
BUN: 19.5 mg/dL (ref 7.0–26.0)
CALCIUM: 10 mg/dL (ref 8.4–10.4)
CHLORIDE: 104 meq/L (ref 98–109)
CO2: 29 mEq/L (ref 22–29)
Creatinine: 1.2 mg/dL (ref 0.7–1.3)
EGFR: 73 mL/min/{1.73_m2} — AB (ref >90)
Glucose: 94 mg/dl (ref 70–140)
POTASSIUM: 4.4 meq/L (ref 3.5–5.1)
SODIUM: 142 meq/L (ref 136–145)
Total Bilirubin: 0.74 mg/dL (ref 0.20–1.20)
Total Protein: 6.8 g/dL (ref 6.4–8.3)

## 2016-01-14 MED ORDER — FAMCICLOVIR 500 MG PO TABS: 500.0000 mg | ORAL_TABLET | Freq: Every day | ORAL | Status: DC

## 2016-01-14 MED ORDER — PREDNISONE 5 MG PO TABS: ORAL_TABLET | ORAL | Status: DC

## 2016-01-14 MED FILL — FAMCICLOVIR 500 MG TABLET: 500 | 30 days supply | Qty: 30 | Fill #0

## 2016-01-14 MED FILL — predniSONE 5 MG TABS: 5 | 14 days supply | Qty: 14 | Fill #0

## 2016-01-14 NOTE — Progress Notes (Signed)
Hematology and Oncology Follow Up Visit  Robert Young [id number] 1975-11-16 40 y.o. 01/14/2016   Principle Diagnosis:  Chronic immune thrombocytopenia   Current Therapy:   Prednisone 5mg  po q day - start today for 14 days and then discontinue    Interim History:  Mr. Bolan is here today for a follow-up. He has relapsed  ITP. We got him back onto prednisone. I started him back on 80 mg a day. We have been able to taper him down.his platelet has held up quite nicely. As such, we will now begin down to 5 mg a day. He had been on 10 mg.  He and his family discovered back from [place]. That really good time.  He's had no problems bleeding. He's had no bruising. He's been trying to exercise. He and his wife will be going up to [place] for his college reunion over [place] Day.  Overall, his performance status is ECOG 0. .   Medications:    Medication List       This list is accurate as of: 01/14/16  1:53 PM.  Always use your most recent med list.               famciclovir 500 MG tablet  Commonly known as:  FAMVIR  Take 1 tablet (500 mg total) by mouth daily.     predniSONE 5 MG tablet  Commonly known as:  DELTASONE  Take 1 pill a day with food for 14 days, then stop        Allergies:  Allergies  Allergen Reactions  . Zyrtec [Cetirizine]     BP elevated    Past Medical History, Surgical history, Social history, and Family History were reviewed and updated.  Review of Systems: All other 10 point review of systems is negative.   Physical Exam:  height is 5\' 9"  (1.753 m) and weight is 175 lb (79.379 kg). His blood pressure is 139/80 and his pulse is 18. His respiration is 98.   Wt Readings from Last 3 Encounters:  01/14/16 175 lb (79.379 kg)  11/07/15 174 lb (78.926 kg)  09/10/15 174 lb (78.926 kg)    Head and neck exam shows no ocular or oral lesion. There are no palpable cervical or supraclavicular lymph nodes. Lungs are clear. Cardiac exam regular rate and  rhythm with no murmurs, rubs or bruits. Abdomen is soft. He has good bowel sounds. There is no fluid wave. There is no palpable liver or spleen tip. Back exam shows no tenderness over the spine, ribs or hips. Extremities shows no clubbing, cyanosis or edema. Skin exam shows no rashes, ecchymoses or petechia. Neurological exam shows no focal neurological deficits.   Lab Results  Component Value Date   WBC 8.5 01/14/2016   HGB 15.3 01/14/2016   HCT 44.0 01/14/2016   MCV 88.7 01/14/2016   PLT 155 Platelet count consistent in citrate 01/14/2016   No results found for: FERRITIN, IRON, TIBC, UIBC, IRONPCTSAT Lab Results  Component Value Date   RBC 4.96 01/14/2016   No results found for: KPAFRELGTCHN, LAMBDASER, KAPLAMBRATIO No results found for: IGGSERUM, IGA, IGMSERUM No results found for: Ronnald Ramp, A1GS, A2GS, Violet Baldy, MSPIKE, SPEI   Chemistry      Component Value Date/Time   NA 142 01/14/2016 1021   NA 141 07/28/2015 0920   K 4.4 01/14/2016 1021   K 3.7 07/28/2015 0920   CL 103 07/28/2015 0920   CO2 29 01/14/2016 1021   CO2 31  07/28/2015 0920   BUN 19.5 01/14/2016 1021   BUN 16 07/28/2015 0920   CREATININE 1.2 01/14/2016 1021   CREATININE 1.21 07/28/2015 0920   CREATININE 1.16 01/14/2015 0934      Component Value Date/Time   CALCIUM 10.0 01/14/2016 1021   CALCIUM 9.3 07/28/2015 0920   ALKPHOS 42 01/14/2016 1021   ALKPHOS 66 07/28/2015 0920   AST 20 01/14/2016 1021   AST 39 07/28/2015 0920   ALT 18 01/14/2016 1021   ALT 50* 07/28/2015 0920   BILITOT 0.74 01/14/2016 1021   BILITOT 0.9 07/28/2015 0920     Impression and Plan: Mr. Dealmeida is a 40 year old white male with chronic immune thrombocytopenia. He responded well to prednisone. He subsequently relapsed. He has responded again.  I will now try to taper his prednisone off. I will do down to 5 mg a day for 14 days and then he can stop.  I think if he does have another relapse, then I  probably would have his spleen taken out. He responds very well to prednisone so getting his spleen taken out should work in the long-term.  I'll like to see him back in one month.    Volanda Napoleon, MD 5/17/20171:53 PM

## 2016-01-16 ENCOUNTER — Ambulatory Visit: Payer: 59 | Admitting: Hematology & Oncology

## 2016-01-16 ENCOUNTER — Other Ambulatory Visit: Payer: 59

## 2016-02-02 ENCOUNTER — Telehealth: Payer: Self-pay | Admitting: *Deleted

## 2016-02-02 ENCOUNTER — Other Ambulatory Visit (HOSPITAL_BASED_OUTPATIENT_CLINIC_OR_DEPARTMENT_OTHER): Payer: 59

## 2016-02-02 DIAGNOSIS — D693 Immune thrombocytopenic purpura: Secondary | ICD-10-CM

## 2016-02-02 LAB — CBC WITH DIFFERENTIAL/PLATELET
BASO%: 0.1 % (ref 0.0–2.0)
BASOS ABS: 0 10*3/uL (ref 0.0–0.1)
EOS ABS: 0.3 10*3/uL (ref 0.0–0.5)
EOS%: 4.6 % (ref 0.0–7.0)
HEMATOCRIT: 42.7 % (ref 38.4–49.9)
HEMOGLOBIN: 15.4 g/dL (ref 13.0–17.1)
LYMPH#: 1.4 10*3/uL (ref 0.9–3.3)
LYMPH%: 19.1 % (ref 14.0–49.0)
MCH: 31.6 pg (ref 27.2–33.4)
MCHC: 36.1 g/dL — AB (ref 32.0–36.0)
MCV: 87.5 fL (ref 79.3–98.0)
MONO#: 0.6 10*3/uL (ref 0.1–0.9)
MONO%: 8.4 % (ref 0.0–14.0)
NEUT#: 4.8 10*3/uL (ref 1.5–6.5)
NEUT%: 67.8 % (ref 39.0–75.0)
PLATELETS: 121 10*3/uL — AB (ref 140–400)
RBC: 4.88 10*6/uL (ref 4.20–5.82)
RDW: 12.2 % (ref 11.0–14.6)
WBC: 7.1 10*3/uL (ref 4.0–10.3)
nRBC: 0 % (ref 0–0)

## 2016-02-02 LAB — CHCC SATELLITE - SMEAR

## 2016-02-02 NOTE — Telephone Encounter (Addendum)
Patient aware of results.   ----- Message from Volanda Napoleon, MD sent at 02/02/2016  3:28 PM EDT ----- Call - platelets are a little lower but still ok!!!  pete

## 2016-02-06 ENCOUNTER — Other Ambulatory Visit: Payer: 59

## 2016-02-11 ENCOUNTER — Encounter: Payer: Self-pay | Admitting: Hematology & Oncology

## 2016-02-11 ENCOUNTER — Ambulatory Visit (HOSPITAL_BASED_OUTPATIENT_CLINIC_OR_DEPARTMENT_OTHER): Payer: 59 | Admitting: Hematology & Oncology

## 2016-02-11 ENCOUNTER — Other Ambulatory Visit (HOSPITAL_BASED_OUTPATIENT_CLINIC_OR_DEPARTMENT_OTHER): Payer: 59

## 2016-02-11 VITALS — BP 143/96 | HR 71 | Temp 97.3°F | Resp 16 | Ht 69.0 in | Wt 174.0 lb

## 2016-02-11 DIAGNOSIS — D693 Immune thrombocytopenic purpura: Secondary | ICD-10-CM

## 2016-02-11 LAB — CBC WITH DIFFERENTIAL/PLATELET
BASO%: 0.1 % (ref 0.0–2.0)
BASOS ABS: 0 10*3/uL (ref 0.0–0.1)
EOS%: 5.2 % (ref 0.0–7.0)
Eosinophils Absolute: 0.4 10*3/uL (ref 0.0–0.5)
HEMATOCRIT: 42.4 % (ref 38.4–49.9)
HGB: 15.2 g/dL (ref 13.0–17.1)
LYMPH%: 19.7 % (ref 14.0–49.0)
MCH: 31.4 pg (ref 27.2–33.4)
MCHC: 35.8 g/dL (ref 32.0–36.0)
MCV: 87.6 fL (ref 79.3–98.0)
MONO#: 0.6 10*3/uL (ref 0.1–0.9)
MONO%: 7.8 % (ref 0.0–14.0)
NEUT#: 5.3 10*3/uL (ref 1.5–6.5)
NEUT%: 67.2 % (ref 39.0–75.0)
Platelets: 141 10*3/uL (ref 140–400)
RBC: 4.84 10*6/uL (ref 4.20–5.82)
RDW: 12 % (ref 11.0–14.6)
WBC: 7.9 10*3/uL (ref 4.0–10.3)
lymph#: 1.6 10*3/uL (ref 0.9–3.3)

## 2016-02-11 NOTE — Progress Notes (Signed)
Hematology and Oncology Follow Up Visit  Robert Young [id number] December 10, 1975 40 y.o. 02/11/2016   Principle Diagnosis:  Chronic immune thrombocytopenia   Current Therapy:   observation    Interim History:  Robert Young is here today for a follow-up. He looks quite good. He had a good Memorial Day weekend. He has wife went up to [place] for a college reunion. He had a great time.  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 He is quite busy.  He has not had any problems bleeding or bruising. He now is off prednisone. He is having that he is off prednisone.  He's had no change in bowel or bladder habits. He has had no cough. He has had no rashes. He has had no weight loss or weight gain.  Overall, his performance status is ECOG 0. .   Medications:    Medication List    Notice  As of 02/11/2016 12:49 PM   You have not been prescribed any medications.      Allergies:  Allergies  Allergen Reactions  . Zyrtec [Cetirizine]     BP elevated    Past Medical History, Surgical history, Social history, and Family History were reviewed and updated.  Review of Systems: All other 10 point review of systems is negative.   Physical Exam:  height is 5\' 9"  (1.753 m) and weight is 174 lb (78.926 kg). His oral temperature is 97.3 F (36.3 C). His blood pressure is 143/96 and his pulse is 71. His respiration is 16.   Wt Readings from Last 3 Encounters:  02/11/16 174 lb (78.926 kg)  01/14/16 175 lb (79.379 kg)  11/07/15 174 lb (78.926 kg)    Head and neck exam shows no ocular or oral lesion. There are no palpable cervical or supraclavicular lymph nodes. Lungs are clear. Cardiac exam regular rate and rhythm with no murmurs, rubs or bruits. Abdomen is soft. He has good bowel sounds. There is no fluid wave. There is no palpable liver or spleen tip. Back exam shows no tenderness over the spine, ribs or hips. Extremities shows no clubbing, cyanosis or edema.  Skin exam shows no rashes, ecchymoses or petechia. Neurological exam shows no focal neurological deficits.   Lab Results  Component Value Date   WBC 7.9 02/11/2016   HGB 15.2 02/11/2016   HCT 42.4 02/11/2016   MCV 87.6 02/11/2016   PLT 141 02/11/2016   No results found for: FERRITIN, IRON, TIBC, UIBC, IRONPCTSAT Lab Results  Component Value Date   RBC 4.84 02/11/2016   No results found for: KPAFRELGTCHN, LAMBDASER, KAPLAMBRATIO No results found for: IGGSERUM, IGA, IGMSERUM No results found for: Odetta Pink, SPEI   Chemistry      Component Value Date/Time   NA 142 01/14/2016 1021   NA 141 07/28/2015 0920   K 4.4 01/14/2016 1021   K 3.7 07/28/2015 0920   CL 103 07/28/2015 0920   CO2 29 01/14/2016 1021   CO2 31 07/28/2015 0920   BUN 19.5 01/14/2016 1021   BUN 16 07/28/2015 0920   CREATININE 1.2 01/14/2016 1021   CREATININE 1.21 07/28/2015 0920   CREATININE 1.16 01/14/2015 0934      Component Value Date/Time   CALCIUM 10.0 01/14/2016 1021   CALCIUM 9.3 07/28/2015 0920   ALKPHOS 42 01/14/2016 1021   ALKPHOS 66 07/28/2015 0920   AST 20 01/14/2016 1021   AST 39 07/28/2015 0920  ALT 18 01/14/2016 1021   ALT 50* 07/28/2015 0920   BILITOT 0.74 01/14/2016 1021   BILITOT 0.9 07/28/2015 0920     Impression and Plan: Robert Young is a 40 year old white male with chronic immune thrombocytopenia. He responded well to prednisone. He subsequently relapsed. He has responded again.  His blood count is doing great. His blood count is up a little bit today.  Again, I think that he has another relapse of the thrombocytopenia, then I think we may have to consider splenectomy for him.  I think we can get him back in 2 months now. Hopefully, if his platelet count continued to stabilize, we can get him back in longer and longer intervals.   Volanda Napoleon, MD 6/14/201712:49 PM

## 2016-03-05 ENCOUNTER — Other Ambulatory Visit (HOSPITAL_BASED_OUTPATIENT_CLINIC_OR_DEPARTMENT_OTHER): Payer: 59

## 2016-03-05 DIAGNOSIS — D693 Immune thrombocytopenic purpura: Secondary | ICD-10-CM | POA: Diagnosis not present

## 2016-03-05 LAB — CBC WITH DIFFERENTIAL/PLATELET
BASO%: 0.4 % (ref 0.0–2.0)
BASOS ABS: 0 10*3/uL (ref 0.0–0.1)
EOS ABS: 0.6 10*3/uL — AB (ref 0.0–0.5)
EOS%: 10.4 % — ABNORMAL HIGH (ref 0.0–7.0)
HEMATOCRIT: 44.1 % (ref 38.4–49.9)
HGB: 15.2 g/dL (ref 13.0–17.1)
LYMPH#: 1.9 10*3/uL (ref 0.9–3.3)
LYMPH%: 33.2 % (ref 14.0–49.0)
MCH: 30.4 pg (ref 27.2–33.4)
MCHC: 34.5 g/dL (ref 32.0–36.0)
MCV: 88.1 fL (ref 79.3–98.0)
MONO#: 0.5 10*3/uL (ref 0.1–0.9)
MONO%: 8.3 % (ref 0.0–14.0)
NEUT#: 2.7 10*3/uL (ref 1.5–6.5)
NEUT%: 47.7 % (ref 39.0–75.0)
PLATELETS: 146 10*3/uL (ref 140–400)
RBC: 5.01 10*6/uL (ref 4.20–5.82)
RDW: 12.2 % (ref 11.0–14.6)
WBC: 5.7 10*3/uL (ref 4.0–10.3)

## 2016-04-02 ENCOUNTER — Other Ambulatory Visit (HOSPITAL_BASED_OUTPATIENT_CLINIC_OR_DEPARTMENT_OTHER): Payer: 59

## 2016-04-02 DIAGNOSIS — D693 Immune thrombocytopenic purpura: Secondary | ICD-10-CM | POA: Diagnosis not present

## 2016-04-02 LAB — CBC WITH DIFFERENTIAL/PLATELET
BASO%: 0.3 % (ref 0.0–2.0)
BASOS ABS: 0 10*3/uL (ref 0.0–0.1)
EOS%: 10.5 % — AB (ref 0.0–7.0)
Eosinophils Absolute: 0.5 10*3/uL (ref 0.0–0.5)
HEMATOCRIT: 41.3 % (ref 38.4–49.9)
HEMOGLOBIN: 14.1 g/dL (ref 13.0–17.1)
LYMPH#: 1.2 10*3/uL (ref 0.9–3.3)
LYMPH%: 24 % (ref 14.0–49.0)
MCH: 30.1 pg (ref 27.2–33.4)
MCHC: 34.1 g/dL (ref 32.0–36.0)
MCV: 88.4 fL (ref 79.3–98.0)
MONO#: 0.4 10*3/uL (ref 0.1–0.9)
MONO%: 8.8 % (ref 0.0–14.0)
NEUT#: 2.8 10*3/uL (ref 1.5–6.5)
NEUT%: 56.4 % (ref 39.0–75.0)
Platelets: 122 10*3/uL — ABNORMAL LOW (ref 140–400)
RBC: 4.67 10*6/uL (ref 4.20–5.82)
RDW: 12.2 % (ref 11.0–14.6)
WBC: 5 10*3/uL (ref 4.0–10.3)

## 2016-04-05 ENCOUNTER — Telehealth: Payer: Self-pay | Admitting: Nurse Practitioner

## 2016-04-05 NOTE — Telephone Encounter (Addendum)
Pt verbalized understanding----- Message from Volanda Napoleon, MD sent at 04/02/2016  1:12 PM EDT ----- Call - platelet count is down a little bit!!  It is 122K.  We will watch for now!!  pete

## 2016-04-13 ENCOUNTER — Other Ambulatory Visit (HOSPITAL_BASED_OUTPATIENT_CLINIC_OR_DEPARTMENT_OTHER): Payer: 59

## 2016-04-13 ENCOUNTER — Ambulatory Visit (HOSPITAL_BASED_OUTPATIENT_CLINIC_OR_DEPARTMENT_OTHER): Payer: 59 | Admitting: Hematology & Oncology

## 2016-04-13 VITALS — BP 143/76 | HR 58 | Temp 98.0°F | Resp 18 | Ht 69.0 in | Wt 176.0 lb

## 2016-04-13 DIAGNOSIS — D693 Immune thrombocytopenic purpura: Secondary | ICD-10-CM | POA: Diagnosis not present

## 2016-04-13 LAB — CBC WITH DIFFERENTIAL (CANCER CENTER ONLY)
BASO#: 0 10*3/uL (ref 0.0–0.2)
BASO%: 0.2 % (ref 0.0–2.0)
EOS ABS: 0.5 10*3/uL (ref 0.0–0.5)
EOS%: 8.6 % — AB (ref 0.0–7.0)
HCT: 41.3 % (ref 38.7–49.9)
HEMOGLOBIN: 14.9 g/dL (ref 13.0–17.1)
LYMPH#: 1.4 10*3/uL (ref 0.9–3.3)
LYMPH%: 23.5 % (ref 14.0–48.0)
MCH: 31.4 pg (ref 28.0–33.4)
MCHC: 36.1 g/dL — ABNORMAL HIGH (ref 32.0–35.9)
MCV: 87 fL (ref 82–98)
MONO#: 0.5 10*3/uL (ref 0.1–0.9)
MONO%: 7.9 % (ref 0.0–13.0)
NEUT%: 59.8 % (ref 40.0–80.0)
NEUTROS ABS: 3.6 10*3/uL (ref 1.5–6.5)
Platelets: 126 10*3/uL — ABNORMAL LOW (ref 145–400)
RBC: 4.75 10*6/uL (ref 4.20–5.70)
RDW: 11.8 % (ref 11.1–15.7)
WBC: 6.1 10*3/uL (ref 4.0–10.0)

## 2016-04-13 NOTE — Progress Notes (Signed)
Hematology and Oncology Follow Up Visit  Robert Young:[id number] 27-Feb-1976 40 y.o. 04/13/2016   Principle Diagnosis:  Chronic immune thrombocytopenia   Current Therapy:   observation    Interim History:  Robert Young is here today for a follow-up. He looks quite good. He had a good summer. He disconnected from the beach. He is family were down in [place]. 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 He is quite busy.  He has not had any problems bleeding or bruising. He has been off  prednisone now for 3 months..  He's had no change in bowel or bladder habits. He has had no cough. He has had no rashes. He has had no weight loss or weight gain.  Overall, his performance status is ECOG 0. .   Medications:    Medication List    as of 04/13/2016 12:23 PM   You have not been prescribed any medications.     Allergies:  Allergies  Allergen Reactions  . Zyrtec [Cetirizine]     BP elevated    Past Medical History, Surgical history, Social history, and Family History were reviewed and updated.  Review of Systems: All other 10 point review of systems is negative.   Physical Exam:  height is 5\' 9"  (1.753 m) and weight is 176 lb (79.8 kg). His oral temperature is 98 F (36.7 C). His blood pressure is 143/76 (abnormal) and his pulse is 58 (abnormal). His respiration is 18.   Wt Readings from Last 3 Encounters:  04/13/16 176 lb (79.8 kg)  02/11/16 174 lb (78.9 kg)  01/14/16 175 lb (79.4 kg)    Head and neck exam shows no ocular or oral lesion. There are no palpable cervical or supraclavicular lymph nodes. Lungs are clear. Cardiac exam regular rate and rhythm with no murmurs, rubs or bruits. Abdomen is soft. He has good bowel sounds. There is no fluid wave. There is no palpable liver or spleen tip. Back exam shows no tenderness over the spine, ribs or hips. Extremities shows no clubbing, cyanosis or edema.  Skin exam shows no rashes, ecchymoses or petechia. Neurological exam shows no focal neurological deficits.   Lab Results  Component Value Date   WBC 6.1 04/13/2016   HGB 14.9 04/13/2016   HCT 41.3 04/13/2016   MCV 87 04/13/2016   PLT 126 (L) 04/13/2016   No results found for: FERRITIN, IRON, TIBC, UIBC, IRONPCTSAT Lab Results  Component Value Date   RBC 4.75 04/13/2016   No results found for: KPAFRELGTCHN, LAMBDASER, KAPLAMBRATIO No results found for: IGGSERUM, IGA, IGMSERUM No results found for: Odetta Pink, SPEI   Chemistry      Component Value Date/Time   NA 142 01/14/2016 1021   K 4.4 01/14/2016 1021   CL 103 07/28/2015 0920   CO2 29 01/14/2016 1021   BUN 19.5 01/14/2016 1021   CREATININE 1.2 01/14/2016 1021      Component Value Date/Time   CALCIUM 10.0 01/14/2016 1021   ALKPHOS 42 01/14/2016 1021   AST 20 01/14/2016 1021   ALT 18 01/14/2016 1021   BILITOT 0.74 01/14/2016 1021     Impression and Plan: Robert Young is a 40 year old white male with chronic immune thrombocytopenia. He responded well to prednisone. He subsequently relapsed. He has responded again.  His platelet count is holding steady right now. He's been off prednisone now for  about 3 months.  I think we get him back in 3 months now. If he does have another relapse, I think splenectomy probably would be the best way to go.    Volanda Napoleon, MD 8/15/201712:23 PM

## 2016-07-14 ENCOUNTER — Other Ambulatory Visit (HOSPITAL_BASED_OUTPATIENT_CLINIC_OR_DEPARTMENT_OTHER): Payer: 59

## 2016-07-14 ENCOUNTER — Ambulatory Visit (HOSPITAL_BASED_OUTPATIENT_CLINIC_OR_DEPARTMENT_OTHER): Payer: 59 | Admitting: Hematology & Oncology

## 2016-07-14 VITALS — BP 139/91 | HR 58 | Temp 98.7°F | Resp 18 | Ht 69.0 in | Wt 178.4 lb

## 2016-07-14 DIAGNOSIS — D693 Immune thrombocytopenic purpura: Secondary | ICD-10-CM | POA: Diagnosis not present

## 2016-07-14 LAB — CBC WITH DIFFERENTIAL (CANCER CENTER ONLY)
BASO#: 0 10*3/uL (ref 0.0–0.2)
BASO%: 0.1 % (ref 0.0–2.0)
EOS%: 5.2 % (ref 0.0–7.0)
Eosinophils Absolute: 0.4 10*3/uL (ref 0.0–0.5)
HEMATOCRIT: 39.2 % (ref 38.7–49.9)
HEMOGLOBIN: 14.3 g/dL (ref 13.0–17.1)
LYMPH#: 1.5 10*3/uL (ref 0.9–3.3)
LYMPH%: 20.2 % (ref 14.0–48.0)
MCH: 30.8 pg (ref 28.0–33.4)
MCHC: 36.5 g/dL — ABNORMAL HIGH (ref 32.0–35.9)
MCV: 84 fL (ref 82–98)
MONO#: 0.5 10*3/uL (ref 0.1–0.9)
MONO%: 7.1 % (ref 0.0–13.0)
NEUT%: 67.4 % (ref 40.0–80.0)
NEUTROS ABS: 5.2 10*3/uL (ref 1.5–6.5)
Platelets: 77 10*3/uL — ABNORMAL LOW (ref 145–400)
RBC: 4.65 10*6/uL (ref 4.20–5.70)
RDW: 11.9 % (ref 11.1–15.7)
WBC: 7.6 10*3/uL (ref 4.0–10.0)

## 2016-07-14 MED ORDER — DEXAMETHASONE 4 MG PO TABS: ORAL_TABLET | ORAL | 4 refills | Status: DC

## 2016-07-14 MED ORDER — FAMCICLOVIR 250 MG PO TABS: 250.0000 mg | ORAL_TABLET | Freq: Every day | ORAL | 3 refills | Status: DC

## 2016-07-14 NOTE — Progress Notes (Signed)
Hematology and Oncology Follow Up Visit  [name] Young:[id number] 08/04/1976 40 y.o. 07/14/2016   Principle Diagnosis:  Chronic immune thrombocytopenia   Current Therapy:   observation    Interim History:  [name] is here today for a follow-up. He looks quite good. He had a good summer.   He is staying quite busy at work. 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  He's had no bleeding or bruising. He's had no weight loss or weight gain. He did have a little bit of a cold a couple weeks ago.   His appetite is good. He's had no nausea or vomiting.  His family is coming in from [place]. He is looking for to having them over for Thanksgiving.   He's had no rashes. He's had no headache. He's had no mouth sores.   Overall, his performance status is ECOG 0. .   Medications:    Medication List       Accurate as of 07/14/16  3:00 PM. Always use your most recent med list.          dexamethasone 4 MG tablet Commonly known as:  DECADRON Take 10 pills a day for 4 days, then 7,5,3,2,1 daily with food   famciclovir 250 MG tablet Commonly known as:  FAMVIR Take 1 tablet (250 mg total) by mouth daily.       Allergies:  Allergies  Allergen Reactions  . Zyrtec [Cetirizine]     BP elevated    Past Medical History, Surgical history, Social history, and Family History were reviewed and updated.  Review of Systems: All other 10 point review of systems is negative.   Physical Exam:  height is 5\' 9"  (1.753 m) and weight is 178 lb 6.4 oz (80.9 kg). His oral temperature is 98.7 F (37.1 C). His blood pressure is 139/91 (abnormal) and his pulse is 58 (abnormal). His respiration is 18.   Wt Readings from Last 3 Encounters:  07/14/16 178 lb 6.4 oz (80.9 kg)  04/13/16 176 lb (79.8 kg)  02/11/16 174 lb (78.9 kg)    Head and neck exam shows no ocular or oral lesion. There are no palpable cervical or supraclavicular lymph nodes. Lungs are clear. Cardiac exam  regular rate and rhythm with no murmurs, rubs or bruits. Abdomen is soft. He has good bowel sounds. There is no fluid wave. There is no palpable liver or spleen tip. Back exam shows no tenderness over the spine, ribs or hips. Extremities shows no clubbing, cyanosis or edema. Skin exam shows no rashes, ecchymoses or petechia. Neurological exam shows no focal neurological deficits.   Lab Results  Component Value Date   WBC 7.6 07/14/2016   HGB 14.3 07/14/2016   HCT 39.2 07/14/2016   MCV 84 07/14/2016   PLT 77 Platelet count consistent in citrate (L) 07/14/2016   No results found for: FERRITIN, IRON, TIBC, UIBC, IRONPCTSAT Lab Results  Component Value Date   RBC 4.65 07/14/2016   No results found for: KPAFRELGTCHN, LAMBDASER, KAPLAMBRATIO No results found for: IGGSERUM, IGA, IGMSERUM No results found for: Ronnald Ramp, A1GS, A2GS, Violet Baldy, MSPIKE, SPEI   Chemistry      Component Value Date/Time   NA 142 01/14/2016 1021   K 4.4 01/14/2016 1021   CL 103 07/28/2015 0920   CO2 29 01/14/2016 1021   BUN 19.5 01/14/2016 1021   CREATININE 1.2 01/14/2016 1021      Component Value Date/Time   CALCIUM 10.0 01/14/2016  1021   ALKPHOS 42 01/14/2016 1021   AST 20 01/14/2016 1021   ALT 18 01/14/2016 1021   BILITOT 0.74 01/14/2016 1021     Impression and Plan: [name] is a 40 year old white male with chronic immune thrombocytopenia. He responded well to prednisone. He subsequently relapsed. He has responded again.  His White count is dropping. He had a recent cold so this might have an effect.  His platelet count is not low enough that we have to embark on any therapy. I taught him at length about what I would recommend. Given that he has responded very well to steroids in the past, I would recommend a splenectomy. I the with slightly, this would give him the best long-term chance that remission.   With the holidays coming up, he will be quite busy. I think we can  just follow his platelet count along. I want to see his CBC done in 2 weeks. He was platelet count is lower, then I would put him on a short course of Decadron to see if this does not raise his platelet count.   I gave him a prescription for the Decadron taper. I also gave her a prescription for Famvir if he starts the Decadron.   Overall, I have a feeling that he will need to have some form of therapy to be accomplished in 2018.   I spent about half hour with him. I just have a sense that he is going to need to consider a splenectomy next year. If so, he will need the triple vaccine before hand. We will work on all this when I see him back. I want to see him back in 6 weeks.  Volanda Napoleon, MD 11/15/20173:00 PM

## 2016-07-28 ENCOUNTER — Other Ambulatory Visit (HOSPITAL_BASED_OUTPATIENT_CLINIC_OR_DEPARTMENT_OTHER): Payer: 59

## 2016-07-28 DIAGNOSIS — D693 Immune thrombocytopenic purpura: Secondary | ICD-10-CM

## 2016-07-28 LAB — CBC WITH DIFFERENTIAL/PLATELET
BASO%: 0.2 % (ref 0.0–2.0)
BASOS ABS: 0 10*3/uL (ref 0.0–0.1)
EOS%: 6.7 % (ref 0.0–7.0)
Eosinophils Absolute: 0.4 10*3/uL (ref 0.0–0.5)
HEMATOCRIT: 40 % (ref 38.4–49.9)
HGB: 14.2 g/dL (ref 13.0–17.1)
LYMPH#: 1.6 10*3/uL (ref 0.9–3.3)
LYMPH%: 27.2 % (ref 14.0–49.0)
MCH: 30.3 pg (ref 27.2–33.4)
MCHC: 35.5 g/dL (ref 32.0–36.0)
MCV: 85.5 fL (ref 79.3–98.0)
MONO#: 0.4 10*3/uL (ref 0.1–0.9)
MONO%: 6.9 % (ref 0.0–14.0)
NEUT#: 3.5 10*3/uL (ref 1.5–6.5)
NEUT%: 59 % (ref 39.0–75.0)
Platelets: 64 10*3/uL — ABNORMAL LOW (ref 140–400)
RBC: 4.68 10*6/uL (ref 4.20–5.82)
RDW: 12.3 % (ref 11.0–14.6)
WBC: 5.9 10*3/uL (ref 4.0–10.3)

## 2016-08-03 ENCOUNTER — Encounter: Payer: Self-pay | Admitting: Hematology & Oncology

## 2016-08-09 ENCOUNTER — Other Ambulatory Visit (HOSPITAL_BASED_OUTPATIENT_CLINIC_OR_DEPARTMENT_OTHER): Payer: 59

## 2016-08-09 DIAGNOSIS — D693 Immune thrombocytopenic purpura: Secondary | ICD-10-CM | POA: Diagnosis not present

## 2016-08-09 LAB — CBC WITH DIFFERENTIAL/PLATELET
BASO%: 0.1 % (ref 0.0–2.0)
BASOS ABS: 0 10*3/uL (ref 0.0–0.1)
EOS ABS: 0.4 10*3/uL (ref 0.0–0.5)
EOS%: 6.2 % (ref 0.0–7.0)
HCT: 43.3 % (ref 38.4–49.9)
HGB: 14.5 g/dL (ref 13.0–17.1)
LYMPH%: 27.5 % (ref 14.0–49.0)
MCH: 29.4 pg (ref 27.2–33.4)
MCHC: 33.6 g/dL (ref 32.0–36.0)
MCV: 87.7 fL (ref 79.3–98.0)
MONO#: 0.4 10*3/uL (ref 0.1–0.9)
MONO%: 6.2 % (ref 0.0–14.0)
NEUT%: 60 % (ref 39.0–75.0)
NEUTROS ABS: 3.9 10*3/uL (ref 1.5–6.5)
RBC: 4.93 10*6/uL (ref 4.20–5.82)
RDW: 12.7 % (ref 11.0–14.6)
WBC: 6.5 10*3/uL (ref 4.0–10.3)
lymph#: 1.8 10*3/uL (ref 0.9–3.3)

## 2016-08-09 LAB — COMPREHENSIVE METABOLIC PANEL
ALBUMIN: 4.4 g/dL (ref 3.5–5.0)
ALK PHOS: 74 U/L (ref 40–150)
ALT: 16 U/L (ref 0–55)
ANION GAP: 10 meq/L (ref 3–11)
AST: 24 U/L (ref 5–34)
BUN: 15.2 mg/dL (ref 7.0–26.0)
CO2: 28 meq/L (ref 22–29)
Calcium: 9.2 mg/dL (ref 8.4–10.4)
Chloride: 105 mEq/L (ref 98–109)
Creatinine: 1.2 mg/dL (ref 0.7–1.3)
EGFR: 76 mL/min/{1.73_m2} — AB (ref >90)
Glucose: 83 mg/dl (ref 70–140)
POTASSIUM: 4.2 meq/L (ref 3.5–5.1)
Sodium: 143 mEq/L (ref 136–145)
TOTAL PROTEIN: 7 g/dL (ref 6.4–8.3)
Total Bilirubin: 0.81 mg/dL (ref 0.20–1.20)

## 2016-09-03 ENCOUNTER — Other Ambulatory Visit: Payer: Self-pay | Admitting: *Deleted

## 2016-09-03 DIAGNOSIS — D693 Immune thrombocytopenic purpura: Secondary | ICD-10-CM

## 2016-09-06 ENCOUNTER — Telehealth: Payer: Self-pay | Admitting: *Deleted

## 2016-09-06 ENCOUNTER — Other Ambulatory Visit (HOSPITAL_BASED_OUTPATIENT_CLINIC_OR_DEPARTMENT_OTHER): Payer: 59

## 2016-09-06 ENCOUNTER — Other Ambulatory Visit: Payer: Self-pay | Admitting: *Deleted

## 2016-09-06 ENCOUNTER — Ambulatory Visit (HOSPITAL_BASED_OUTPATIENT_CLINIC_OR_DEPARTMENT_OTHER): Payer: 59 | Admitting: Hematology & Oncology

## 2016-09-06 ENCOUNTER — Ambulatory Visit (HOSPITAL_BASED_OUTPATIENT_CLINIC_OR_DEPARTMENT_OTHER): Payer: 59

## 2016-09-06 ENCOUNTER — Other Ambulatory Visit: Payer: Self-pay | Admitting: Family

## 2016-09-06 VITALS — BP 139/85 | HR 78 | Temp 100.9°F | Wt 174.0 lb

## 2016-09-06 DIAGNOSIS — D693 Immune thrombocytopenic purpura: Secondary | ICD-10-CM

## 2016-09-06 DIAGNOSIS — R6889 Other general symptoms and signs: Secondary | ICD-10-CM

## 2016-09-06 LAB — COMPREHENSIVE METABOLIC PANEL (CC13)
A/G RATIO: 2.1 (ref 1.2–2.2)
ALBUMIN: 4.7 g/dL (ref 3.5–5.5)
ALK PHOS: 50 IU/L (ref 39–117)
ALT: 14 IU/L (ref 0–44)
AST: 22 IU/L (ref 0–40)
BUN / CREAT RATIO: 13 (ref 9–20)
BUN: 17 mg/dL (ref 6–20)
Bilirubin Total: 0.9 mg/dL (ref 0.0–1.2)
CO2: 30 mmol/L — AB (ref 18–29)
CREATININE: 1.35 mg/dL — AB (ref 0.76–1.27)
Calcium, Ser: 8.5 mg/dL — ABNORMAL LOW (ref 8.7–10.2)
Chloride, Ser: 94 mmol/L — ABNORMAL LOW (ref 96–106)
GFR calc Af Amer: 76 mL/min/{1.73_m2} (ref >59)
GFR, EST NON AFRICAN AMERICAN: 66 mL/min/{1.73_m2} (ref >59)
GLOBULIN, TOTAL: 2.2 g/dL (ref 1.5–4.5)
Glucose: 100 mg/dL — ABNORMAL HIGH (ref 65–99)
POTASSIUM: 3.9 mmol/L (ref 3.5–5.2)
SODIUM: 131 mmol/L — AB (ref 134–144)
Total Protein: 6.9 g/dL (ref 6.0–8.5)

## 2016-09-06 LAB — CBC WITH DIFFERENTIAL (CANCER CENTER ONLY)
BASO#: 0 10*3/uL (ref 0.0–0.2)
BASO%: 0.2 % (ref 0.0–2.0)
EOS ABS: 0 10*3/uL (ref 0.0–0.5)
EOS%: 0.3 % (ref 0.0–7.0)
HEMATOCRIT: 41.2 % (ref 38.7–49.9)
HEMOGLOBIN: 14.9 g/dL (ref 13.0–17.1)
LYMPH#: 1.4 10*3/uL (ref 0.9–3.3)
LYMPH%: 21.6 % (ref 14.0–48.0)
MCH: 30.7 pg (ref 28.0–33.4)
MCHC: 36.2 g/dL — ABNORMAL HIGH (ref 32.0–35.9)
MCV: 85 fL (ref 82–98)
MONO#: 0.7 10*3/uL (ref 0.1–0.9)
MONO%: 10.6 % (ref 0.0–13.0)
NEUT#: 4.4 10*3/uL (ref 1.5–6.5)
NEUT%: 67.3 % (ref 40.0–80.0)
Platelets: <6 10*3/uL — CL (ref 145–400)
RBC: 4.86 10*6/uL (ref 4.20–5.70)
RDW: 11.5 % (ref 11.1–15.7)
WBC: 6.5 10*3/uL (ref 4.0–10.0)

## 2016-09-06 MED ORDER — OSELTAMIVIR PHOSPHATE 75 MG PO CAPS: 75.0000 mg | ORAL_CAPSULE | Freq: Two times a day (BID) | ORAL | 0 refills | Status: DC

## 2016-09-06 MED ORDER — ROMIPLOSTIM 250 MCG ~~LOC~~ SOLR
1.0000 ug/kg | Freq: Once | SUBCUTANEOUS | Status: AC
  Administered 2016-09-06: 80 ug via SUBCUTANEOUS
  Filled 2016-09-06: qty 0.16

## 2016-09-06 MED ORDER — DEXAMETHASONE 4 MG PO TABS: 40.0000 mg | ORAL_TABLET | Freq: Every day | ORAL | 0 refills | Status: DC

## 2016-09-06 MED ORDER — SODIUM CHLORIDE 0.9 % IV SOLN
Freq: Once | INTRAVENOUS | Status: AC
  Administered 2016-09-06: 16:00:00 via INTRAVENOUS

## 2016-09-06 MED ORDER — FAMCICLOVIR 500 MG PO TABS: 500.0000 mg | ORAL_TABLET | Freq: Every day | ORAL | 8 refills | Status: DC

## 2016-09-06 MED ORDER — SODIUM CHLORIDE 0.9 % IV SOLN
40.0000 mg | Freq: Once | INTRAVENOUS | Status: AC
  Administered 2016-09-06: 40 mg via INTRAVENOUS
  Filled 2016-09-06: qty 4

## 2016-09-06 MED FILL — OSELTAMIVIR PHOS 75 MG CAP: 75 | 5 days supply | Qty: 10 | Fill #0

## 2016-09-06 MED FILL — DEXAMETHASONE 4 MG TABLET: 4 | 9 days supply | Qty: 60 | Fill #0

## 2016-09-06 MED FILL — FAMCICLOVIR 250 MG TABLET: 250 | 30 days supply | Qty: 30 | Fill #0

## 2016-09-06 NOTE — Telephone Encounter (Signed)
Critical Value Plt <6 Dr Marin Olp notified. No orders at this time

## 2016-09-06 NOTE — Progress Notes (Signed)
Hematology and Oncology Follow Up Visit  MEADE EDD [id number] July 20, 1976 41 y.o. 09/06/2016   Principle Diagnosis:  Chronic immune thrombocytopenia  - Relapsed  Current Therapy:  Decadron 40 mg po q day x 4 days every 2 weeks Nplate - dose adjusted per pharmacy Famvir 500 mg po q day      Interim History:  Mr. Puff is here today for a follow-up.unfortunately, he clearly has relapse of the ITP. His blood counts less than 6000.  He comes in with what appears to be a viral syndrome. He thinks he got this from his wife. He actually was out in [facility] recently and got sick out there. He is on up there for about a day.  He comes in today. He's had no obvious bleeding. He has petechia on his arms.  He's had a temperature up to 101.5. He has not seen his family doctor. We will go ahead and get him on some Tamiflu. He has had diarrhea. He's had a lot of myalgias and arthralgias.  He's had no mouth sores.  He's had diarrhea. He is taking some Imodium for this.   He had no problems over Thanksgiving and Christmas.  He has not noted any sweats. He's had no cough. He's had no dysuria. There's been no hematuria.   He's had some nausea. I do not believe that there has been any vomiting.  Overall, his performance status is ECOG 0. .   Medications:  Allergies as of 09/06/2016      Reactions   Zyrtec [cetirizine]    BP elevated      Medication List       Accurate as of 09/06/16  4:38 PM. Always use your most recent med list.          dexamethasone 4 MG tablet Commonly known as:  DECADRON Take 10 tablets (40 mg total) by mouth daily.   famciclovir 500 MG tablet Commonly known as:  FAMVIR Take 1 tablet (500 mg total) by mouth daily.   oseltamivir 75 MG capsule Commonly known as:  TAMIFLU Take 1 capsule (75 mg total) by mouth 2 (two) times daily.       Allergies:  Allergies  Allergen Reactions  . Zyrtec [Cetirizine]     BP elevated    Past Medical History,  Surgical history, Social history, and Family History were reviewed and updated.  Review of Systems: All other 10 point review of systems is negative.   Physical Exam:  weight is 174 lb (78.9 kg). His oral temperature is 100.9 F (38.3 C) (abnormal). His blood pressure is 139/85 and his pulse is 78.   Wt Readings from Last 3 Encounters:  09/06/16 174 lb (78.9 kg)  07/14/16 178 lb 6.4 oz (80.9 kg)  04/13/16 176 lb (79.8 kg)    Head and neck exam shows no ocular or oral lesion. He has no palatal petechia. There are no palpable cervical or supraclavicular lymph nodes. Lungs are clear. Cardiac exam regular rate and rhythm with no murmurs, rubs or bruits. Abdomen is soft. He has good bowel sounds. There is no fluid wave. There is no palpable liver or spleen tip. Back exam shows no tenderness over the spine, ribs or hips. Extremities shows no clubbing, cyanosis or edema. Skin exam shows some scattered petechia on his upper arms. He has rare small ecchymoses. Neurological exam shows no focal neurological deficits..   Lab Results  Component Value Date   WBC 6.5 09/06/2016   HGB 14.9  09/06/2016   HCT 41.2 09/06/2016   MCV 85 09/06/2016   PLT <6 (LL) 09/06/2016   No results found for: FERRITIN, IRON, TIBC, UIBC, IRONPCTSAT Lab Results  Component Value Date   RBC 4.86 09/06/2016   No results found for: KPAFRELGTCHN, LAMBDASER, KAPLAMBRATIO No results found for: IGGSERUM, IGA, IGMSERUM No results found for: Odetta Pink, SPEI   Chemistry      Component Value Date/Time   NA 143 08/09/2016 1512   K 4.2 08/09/2016 1512   CL 103 07/28/2015 0920   CO2 28 08/09/2016 1512   BUN 15.2 08/09/2016 1512   CREATININE 1.2 08/09/2016 1512      Component Value Date/Time   CALCIUM 9.2 08/09/2016 1512   ALKPHOS 74 08/09/2016 1512   AST 24 08/09/2016 1512   ALT 16 08/09/2016 1512   BILITOT 0.81 08/09/2016 1512     Impression and Plan: Mr. Steinbacher  is a 41 year old white male with chronic immune thrombocytopenia. He responded well to prednisone. He subsequently relapsed. He has responded again.  It is obvious that he clearly has relapsed immune thrombocytopenia.  I have talked to him in the past as to what the options are. I talked to him about splenectomy. I really think that splenectomy would be a very good idea for him. He has responded very well to steroids.  I will go ahead and give him some IV fluid today. He'll get 500 mL of normal saline. I'll give him a dose of Decadron at 40 mg. He'll then take 40 mg daily for 3 days.  I told him to make sure he takes Decadron with food. He also needs to start taking an antacid or PPI.  I will get him on Famvir. I will start him on 500 mg daily as a preventative agent.   We'll give give him a dose of Nplate. I think this would be helpful along with the Decadron to try to get his platelet count up quickly.   I looked at his blood under the microscope. I do not see anything that looked suspicious for any type of myelodysplastic or leukemic process. I saw nothing that indicated any type of marrow infiltration. He has a very rare platelet. The platelets that I saw were large. His platelets were well granulated.  We will need to have him come back weekly for right now.  He will think about a splenectomy. Again, I really think this would be the best way to go for him as I believe that he would get a fairly long-term remission.  We spent about 45 minutes with him. I really was not prepared for him to have such profound thrombocytopenia.  He is stable otherwise. Again, I think this viral syndrome that he has probably triggered the immune thrombocytopenia to "flareup".   Volanda Napoleon, MD 1/8/20184:38 PM

## 2016-09-08 ENCOUNTER — Other Ambulatory Visit: Payer: Self-pay | Admitting: *Deleted

## 2016-09-08 DIAGNOSIS — D693 Immune thrombocytopenic purpura: Secondary | ICD-10-CM

## 2016-09-09 ENCOUNTER — Other Ambulatory Visit (HOSPITAL_BASED_OUTPATIENT_CLINIC_OR_DEPARTMENT_OTHER): Payer: 59

## 2016-09-09 DIAGNOSIS — D693 Immune thrombocytopenic purpura: Secondary | ICD-10-CM | POA: Diagnosis not present

## 2016-09-09 LAB — CBC WITH DIFFERENTIAL (CANCER CENTER ONLY)
BASO#: 0 10*3/uL (ref 0.0–0.2)
BASO%: 0.1 % (ref 0.0–2.0)
EOS ABS: 0 10*3/uL (ref 0.0–0.5)
EOS%: 0 % (ref 0.0–7.0)
HEMATOCRIT: 41.4 % (ref 38.7–49.9)
HGB: 14.8 g/dL (ref 13.0–17.1)
LYMPH#: 0.8 10*3/uL — AB (ref 0.9–3.3)
LYMPH%: 7.3 % — AB (ref 14.0–48.0)
MCH: 30.7 pg (ref 28.0–33.4)
MCHC: 35.7 g/dL (ref 32.0–35.9)
MCV: 86 fL (ref 82–98)
MONO#: 0.7 10*3/uL (ref 0.1–0.9)
MONO%: 7.1 % (ref 0.0–13.0)
NEUT#: 8.8 10*3/uL — ABNORMAL HIGH (ref 1.5–6.5)
NEUT%: 85.5 % — AB (ref 40.0–80.0)
RBC: 4.82 10*6/uL (ref 4.20–5.70)
RDW: 11.6 % (ref 11.1–15.7)
WBC: 10.3 10*3/uL — ABNORMAL HIGH (ref 4.0–10.0)

## 2016-09-09 LAB — TECHNOLOGIST REVIEW CHCC SATELLITE

## 2016-09-17 ENCOUNTER — Ambulatory Visit (HOSPITAL_BASED_OUTPATIENT_CLINIC_OR_DEPARTMENT_OTHER): Payer: 59 | Admitting: Hematology & Oncology

## 2016-09-17 ENCOUNTER — Other Ambulatory Visit (HOSPITAL_BASED_OUTPATIENT_CLINIC_OR_DEPARTMENT_OTHER): Payer: 59

## 2016-09-17 ENCOUNTER — Ambulatory Visit: Payer: 59

## 2016-09-17 DIAGNOSIS — R6889 Other general symptoms and signs: Secondary | ICD-10-CM

## 2016-09-17 DIAGNOSIS — D693 Immune thrombocytopenic purpura: Secondary | ICD-10-CM

## 2016-09-17 LAB — CBC WITH DIFFERENTIAL (CANCER CENTER ONLY)
BASO#: 0 10*3/uL (ref 0.0–0.2)
BASO%: 0 % (ref 0.0–2.0)
EOS ABS: 0 10*3/uL (ref 0.0–0.5)
EOS%: 0.3 % (ref 0.0–7.0)
HCT: 44.3 % (ref 38.7–49.9)
HGB: 15.4 g/dL (ref 13.0–17.1)
LYMPH#: 2.1 10*3/uL (ref 0.9–3.3)
LYMPH%: 18.4 % (ref 14.0–48.0)
MCH: 30.5 pg (ref 28.0–33.4)
MCHC: 34.8 g/dL (ref 32.0–35.9)
MCV: 88 fL (ref 82–98)
MONO#: 0.8 10*3/uL (ref 0.1–0.9)
MONO%: 7.3 % (ref 0.0–13.0)
NEUT#: 8.4 10*3/uL — ABNORMAL HIGH (ref 1.5–6.5)
NEUT%: 74 % (ref 40.0–80.0)
PLATELETS: 388 10*3/uL (ref 145–400)
RBC: 5.05 10*6/uL (ref 4.20–5.70)
RDW: 11.9 % (ref 11.1–15.7)
WBC: 11.3 10*3/uL — AB (ref 4.0–10.0)

## 2016-09-17 LAB — COMPREHENSIVE METABOLIC PANEL
ALT: 25 U/L (ref 0–55)
AST: 19 U/L (ref 5–34)
Albumin: 3.9 g/dL (ref 3.5–5.0)
Alkaline Phosphatase: 57 U/L (ref 40–150)
Anion Gap: 7 mEq/L (ref 3–11)
BILIRUBIN TOTAL: 0.67 mg/dL (ref 0.20–1.20)
BUN: 15.3 mg/dL (ref 7.0–26.0)
CHLORIDE: 98 meq/L (ref 98–109)
CO2: 33 meq/L — AB (ref 22–29)
Calcium: 9.4 mg/dL (ref 8.4–10.4)
Creatinine: 1.1 mg/dL (ref 0.7–1.3)
EGFR: 81 mL/min/{1.73_m2} — AB (ref >90)
GLUCOSE: 76 mg/dL (ref 70–140)
Potassium: 4.4 mEq/L (ref 3.5–5.1)
SODIUM: 139 meq/L (ref 136–145)
Total Protein: 6.7 g/dL (ref 6.4–8.3)

## 2016-09-17 LAB — TECHNOLOGIST REVIEW CHCC SATELLITE

## 2016-09-17 NOTE — Progress Notes (Signed)
Hematology and Oncology Follow Up Visit  FLAVEL COSTNER [id number] 03-Jul-1976 41 y.o. 09/17/2016   Principle Diagnosis:  Chronic immune thrombocytopenia  - Relapsed  Current Therapy:  Decadron 40 mg po q day x 4 days every 2 weeks Nplate - dose adjusted per pharmacy Famvir 500 mg po q day      Interim History:  Mr. Wilkin is here today for a follow-up.he looks so much better. We just saw him a week or so ago. Looks like he has some type of viral syndrome. He has improved quite nicely.  He is back at work.  He's had no bleeding. He's had no rashes. He's had no change in bowel or bladder habits.  We gave him Decadron for 4 days with a taper. Gave him a dose of Nplate. This definitely helped. His platelet count is now 388,000.   He has had no fever. He has had no leg swelling. He has had no mouth sores. He's had no headache.   Overall, his performance status is ECOG 0. .   Medications:  Allergies as of 09/17/2016      Reactions   Zyrtec [cetirizine]    BP elevated      Medication List       Accurate as of 09/17/16  1:42 PM. Always use your most recent med list.          dexamethasone 4 MG tablet Commonly known as:  DECADRON Take 10 tablets (40 mg total) by mouth daily.   famciclovir 500 MG tablet Commonly known as:  FAMVIR Take 1 tablet (500 mg total) by mouth daily.   oseltamivir 75 MG capsule Commonly known as:  TAMIFLU Take 1 capsule (75 mg total) by mouth 2 (two) times daily.       Allergies:  Allergies  Allergen Reactions  . Zyrtec [Cetirizine]     BP elevated    Past Medical History, Surgical history, Social history, and Family History were reviewed and updated.  Review of Systems: All other 10 point review of systems is negative.   Physical Exam:  vitals were not taken for this visit.  Wt Readings from Last 3 Encounters:  09/06/16 174 lb (78.9 kg)  07/14/16 178 lb 6.4 oz (80.9 kg)  04/13/16 176 lb (79.8 kg)    Head and neck exam shows no  ocular or oral lesion. He has no palatal petechia. There are no palpable cervical or supraclavicular lymph nodes. Lungs are clear. Cardiac exam regular rate and rhythm with no murmurs, rubs or bruits. Abdomen is soft. He has good bowel sounds. There is no fluid wave. There is no palpable liver or spleen tip. Back exam shows no tenderness over the spine, ribs or hips. Extremities shows no clubbing, cyanosis or edema. Skin exam shows some scattered petechia on his upper arms. He has rare small ecchymoses. Neurological exam shows no focal neurological deficits..   Lab Results  Component Value Date   WBC 11.3 (H) 09/17/2016   HGB 15.4 09/17/2016   HCT 44.3 09/17/2016   MCV 88 09/17/2016   PLT 388 09/17/2016   No results found for: FERRITIN, IRON, TIBC, UIBC, IRONPCTSAT Lab Results  Component Value Date   RBC 5.05 09/17/2016   No results found for: KPAFRELGTCHN, LAMBDASER, KAPLAMBRATIO No results found for: IGGSERUM, IGA, IGMSERUM No results found for: TOTALPROTELP, ALBUMINELP, A1GS, A2GS, BETS, BETA2SER, GAMS, MSPIKE, SPEI   Chemistry      Component Value Date/Time   NA 131 (L) 09/06/2016 1502  NA 143 08/09/2016 1512   K 3.9 09/06/2016 1502   K 4.2 08/09/2016 1512   CL 94 (L) 09/06/2016 1502   CO2 30 (H) 09/06/2016 1502   CO2 28 08/09/2016 1512   BUN 17 09/06/2016 1502   BUN 15.2 08/09/2016 1512   CREATININE 1.35 (H) 09/06/2016 1502   CREATININE 1.2 08/09/2016 1512      Component Value Date/Time   CALCIUM 8.5 (L) 09/06/2016 1502   CALCIUM 9.2 08/09/2016 1512   ALKPHOS 50 09/06/2016 1502   ALKPHOS 74 08/09/2016 1512   AST 22 09/06/2016 1502   AST 24 08/09/2016 1512   ALT 14 09/06/2016 1502   ALT 16 08/09/2016 1512   BILITOT 0.9 09/06/2016 1502   BILITOT 0.81 08/09/2016 1512     Impression and Plan: Mr. Andolina is a 41 year old white male with chronic immune thrombocytopenia. He responded well to prednisone. He subsequently relapsed. He has responded again.  He is clearly  steroid responsive still. I think that the combination of Decadron with Nplate had a synergistic effect and his plantar count has gone up quite well.  For now, I think we can just follow him along. I still want to try to hold on any invasive procedure (i.e. splenectomy). He may need this in the future. However, since he is doing so well right now, I think we can just watch him.  He does not need any endplate today.  I think if we have to get him back on treatment, Decadron with the taper definitely will work.   I would like to see him back in 3 weeks. I think this would be very reasonable.    Volanda Napoleon, MD 1/19/20181:42 PM

## 2016-10-06 ENCOUNTER — Other Ambulatory Visit (HOSPITAL_BASED_OUTPATIENT_CLINIC_OR_DEPARTMENT_OTHER): Payer: 59

## 2016-10-06 DIAGNOSIS — D693 Immune thrombocytopenic purpura: Secondary | ICD-10-CM | POA: Diagnosis not present

## 2016-10-06 LAB — COMPREHENSIVE METABOLIC PANEL
ALT: 18 U/L (ref 0–55)
ANION GAP: 8 meq/L (ref 3–11)
AST: 19 U/L (ref 5–34)
Albumin: 4 g/dL (ref 3.5–5.0)
Alkaline Phosphatase: 76 U/L (ref 40–150)
BUN: 12.8 mg/dL (ref 7.0–26.0)
CALCIUM: 9.3 mg/dL (ref 8.4–10.4)
CHLORIDE: 104 meq/L (ref 98–109)
CO2: 30 mEq/L — ABNORMAL HIGH (ref 22–29)
Creatinine: 1.1 mg/dL (ref 0.7–1.3)
EGFR: 84 mL/min/{1.73_m2} — AB (ref >90)
Glucose: 92 mg/dl (ref 70–140)
POTASSIUM: 4.2 meq/L (ref 3.5–5.1)
Sodium: 143 mEq/L (ref 136–145)
Total Bilirubin: 0.67 mg/dL (ref 0.20–1.20)
Total Protein: 6.6 g/dL (ref 6.4–8.3)

## 2016-10-06 LAB — CBC WITH DIFFERENTIAL/PLATELET
BASO%: 0.2 % (ref 0.0–2.0)
BASOS ABS: 0 10*3/uL (ref 0.0–0.1)
EOS%: 3.2 % (ref 0.0–7.0)
Eosinophils Absolute: 0.2 10*3/uL (ref 0.0–0.5)
HEMATOCRIT: 39.1 % (ref 38.4–49.9)
HGB: 13.5 g/dL (ref 13.0–17.1)
LYMPH%: 23 % (ref 14.0–49.0)
MCH: 30.5 pg (ref 27.2–33.4)
MCHC: 34.5 g/dL (ref 32.0–36.0)
MCV: 88.3 fL (ref 79.3–98.0)
MONO#: 0.6 10*3/uL (ref 0.1–0.9)
MONO%: 12.2 % (ref 0.0–14.0)
NEUT#: 3.2 10*3/uL (ref 1.5–6.5)
NEUT%: 61.4 % (ref 39.0–75.0)
RBC: 4.43 10*6/uL (ref 4.20–5.82)
RDW: 12.6 % (ref 11.0–14.6)
WBC: 5.3 10*3/uL (ref 4.0–10.3)
lymph#: 1.2 10*3/uL (ref 0.9–3.3)
nRBC: 0 % (ref 0–0)

## 2016-10-07 ENCOUNTER — Ambulatory Visit (HOSPITAL_BASED_OUTPATIENT_CLINIC_OR_DEPARTMENT_OTHER): Payer: 59 | Admitting: Hematology & Oncology

## 2016-10-07 VITALS — BP 129/85 | HR 61 | Temp 97.6°F | Resp 16 | Wt 174.8 lb

## 2016-10-07 DIAGNOSIS — D693 Immune thrombocytopenic purpura: Secondary | ICD-10-CM | POA: Diagnosis not present

## 2016-10-07 NOTE — Progress Notes (Signed)
Hematology and Oncology Follow Up Visit  [name] 1976/04/11 41 y.o. 10/07/2016   Principle Diagnosis:  Chronic immune thrombocytopenia  - Relapsed  Current Therapy:  Decadron 40 mg po q day x 4 days every 2 weeks Nplate - dose adjusted per pharmacy Famvir 500 mg po q day      Interim History:  [name] is here today for a follow-up. He is doing well. He had no problems with bleeding or bruising. He has had no issues with fatigue or weakness. He is still working over in radiation oncology.  He has not noted any fever. He's had no cough. He's had no rashes. He's had no change in bowel or bladder habits.  He's had no leg swelling. He's had no bowel sores. He's had no headache.  Overall, his performance status is ECOG 0. .   Medications:  Allergies as of 10/07/2016      Reactions   Zyrtec [cetirizine]    BP elevated      Medication List    as of 10/07/2016  5:11 PM   You have not been prescribed any medications.     Allergies:  Allergies  Allergen Reactions  . Zyrtec [Cetirizine]     BP elevated    Past Medical History, Surgical history, Social history, and Family History were reviewed and updated.  Review of Systems: All other 10 point review of systems is negative.   Physical Exam:  weight is 174 lb 12 oz (79.3 kg). His oral temperature is 97.6 F (36.4 C). His blood pressure is 129/85 and his pulse is 61. His respiration is 16 and oxygen saturation is 100%.   Wt Readings from Last 3 Encounters:  10/07/16 174 lb 12 oz (79.3 kg)  09/06/16 174 lb (78.9 kg)  07/14/16 178 lb 6.4 oz (80.9 kg)    Head and neck exam shows no ocular or oral lesion. He has no palatal petechia. There are no palpable cervical or supraclavicular lymph nodes. Lungs are clear. Cardiac exam regular rate and rhythm with no murmurs, rubs or bruits. Abdomen is soft. He has good bowel sounds. There is no fluid wave. There is no palpable liver or spleen tip. Back exam shows no  tenderness over the spine, ribs or hips. Extremities shows no clubbing, cyanosis or edema. Skin exam shows some scattered petechia on his upper arms. He has rare small ecchymoses. Neurological exam shows no focal neurological deficits..   Lab Results  Component Value Date   WBC 5.3 10/06/2016   HGB 13.5 10/06/2016   HCT 39.1 10/06/2016   MCV 88.3 10/06/2016   PLT 127 Platelet count consistent in citrate (L) 10/06/2016   No results found for: FERRITIN, IRON, TIBC, UIBC, IRONPCTSAT Lab Results  Component Value Date   RBC 4.43 10/06/2016   No results found for: KPAFRELGTCHN, LAMBDASER, KAPLAMBRATIO No results found for: IGGSERUM, IGA, IGMSERUM No results found for: Odetta Pink, SPEI   Chemistry      Component Value Date/Time   NA 143 10/06/2016 1508   K 4.2 10/06/2016 1508   CL 94 (L) 09/06/2016 1502   CO2 30 (H) 10/06/2016 1508   BUN 12.8 10/06/2016 1508   CREATININE 1.1 10/06/2016 1508      Component Value Date/Time   CALCIUM 9.3 10/06/2016 1508   ALKPHOS 76 10/06/2016 1508   AST 19 10/06/2016 1508   ALT 18 10/06/2016 1508   BILITOT 0.67 10/06/2016 1508  Impression and Plan: [name] is a 41 year old white male with chronic immune thrombocytopenia. He responded well to prednisone. He subsequently relapsed. He has responded again.  His platelet count  is going down. Again I suspect that he is slowly relapsing.  I still think that a splenectomy is the best way to go with him. I think he would respond well to a splenectomy. I would think that a splint to be would be done laparoscopically.  We talked about this. I think that the long-term complications would be minimal. He'll get his vaccinations prior to the splenectomy.  He is going get a second opinion at [facility]. I think this is a good idea. I cannot imagine that they would have any other recommendations. Any other recommendations would probably not be as effective  as a splenectomy and can certainly be more costly in actual amount and also in the time needed for therapy.   I will continue to have him come back every 2 weeks for lab work. I will see him back in March after he sees the doctors at [facility] on March 12.     Volanda Napoleon, MD 2/8/20185:11 PM

## 2016-10-08 ENCOUNTER — Other Ambulatory Visit: Payer: 59

## 2016-10-08 ENCOUNTER — Ambulatory Visit: Payer: 59 | Admitting: Hematology & Oncology

## 2016-11-08 DIAGNOSIS — D693 Immune thrombocytopenic purpura: Secondary | ICD-10-CM | POA: Diagnosis not present

## 2016-11-09 ENCOUNTER — Other Ambulatory Visit (HOSPITAL_BASED_OUTPATIENT_CLINIC_OR_DEPARTMENT_OTHER): Payer: 59

## 2016-11-09 ENCOUNTER — Ambulatory Visit (HOSPITAL_BASED_OUTPATIENT_CLINIC_OR_DEPARTMENT_OTHER): Payer: 59 | Admitting: Hematology & Oncology

## 2016-11-09 VITALS — BP 138/82 | HR 55 | Temp 97.8°F | Resp 18 | Wt 174.8 lb

## 2016-11-09 DIAGNOSIS — D693 Immune thrombocytopenic purpura: Secondary | ICD-10-CM

## 2016-11-09 LAB — CBC WITH DIFFERENTIAL (CANCER CENTER ONLY)
BASO#: 0 10*3/uL (ref 0.0–0.2)
BASO%: 0.2 % (ref 0.0–2.0)
EOS%: 9.3 % — AB (ref 0.0–7.0)
Eosinophils Absolute: 0.5 10*3/uL (ref 0.0–0.5)
HCT: 41.6 % (ref 38.7–49.9)
HGB: 14.8 g/dL (ref 13.0–17.1)
LYMPH#: 1.7 10*3/uL (ref 0.9–3.3)
LYMPH%: 31.6 % (ref 14.0–48.0)
MCH: 30.6 pg (ref 28.0–33.4)
MCHC: 35.6 g/dL (ref 32.0–35.9)
MCV: 86 fL (ref 82–98)
MONO#: 0.5 10*3/uL (ref 0.1–0.9)
MONO%: 9 % (ref 0.0–13.0)
NEUT#: 2.7 10*3/uL (ref 1.5–6.5)
NEUT%: 49.9 % (ref 40.0–80.0)
RBC: 4.83 10*6/uL (ref 4.20–5.70)
RDW: 12.4 % (ref 11.1–15.7)
WBC: 5.4 10*3/uL (ref 4.0–10.0)

## 2016-11-09 LAB — COMPREHENSIVE METABOLIC PANEL
ALT: 16 U/L (ref 0–55)
AST: 21 U/L (ref 5–34)
Albumin: 4.7 g/dL (ref 3.5–5.0)
Alkaline Phosphatase: 60 U/L (ref 40–150)
Anion Gap: 10 mEq/L (ref 3–11)
BILIRUBIN TOTAL: 1.18 mg/dL (ref 0.20–1.20)
BUN: 13.4 mg/dL (ref 7.0–26.0)
CO2: 28 meq/L (ref 22–29)
Calcium: 9.5 mg/dL (ref 8.4–10.4)
Chloride: 105 mEq/L (ref 98–109)
Creatinine: 1.2 mg/dL (ref 0.7–1.3)
EGFR: 74 mL/min/{1.73_m2} — AB (ref >90)
GLUCOSE: 107 mg/dL (ref 70–140)
Potassium: 4.3 mEq/L (ref 3.5–5.1)
SODIUM: 142 meq/L (ref 136–145)
TOTAL PROTEIN: 6.8 g/dL (ref 6.4–8.3)

## 2016-11-09 NOTE — Progress Notes (Signed)
Hematology and Oncology Follow Up Visit  Robert Young [id number] [birth year] 41 y.o. 11/09/2016   Principle Diagnosis:  Chronic immune thrombocytopenia  - Relapsed  Current Therapy:  Decadron 40 mg po q day x 4 days every 2 weeks Nplate - dose adjusted per pharmacy Famvir 500 mg po q day      Interim History:  Robert Young is here today for a follow-up. He was seen at [facility] for a second opinion. They recommended Rituxan or splenectomy. He really does not want to consider Rituxan. He prefers a splenectomy. However, he wants to hold off on a splenectomy until the fall.  He probably would do well with a pulse steroids as needed.   He feels well. He's had no problems with nausea or vomiting. He has had no fever. He has had no change in bowel or bladder habits.   He has had no rashes. He has had no bleeding. He has had no leg swelling.   He is still working in radiation oncology.   Overall, his performance status is ECOG 0. .   Medications:  Allergies as of 11/09/2016      Reactions   Zyrtec [cetirizine]    BP elevated      Medication List    as of 11/09/2016 12:44 PM   You have not been prescribed any medications.     Allergies:  Allergies  Allergen Reactions  . Zyrtec [Cetirizine]     BP elevated    Past Medical History, Surgical history, Social history, and Family History were reviewed and updated.  Review of Systems: All other 10 point review of systems is negative.   Physical Exam:  weight is 174 lb 12.8 oz (79.3 kg). His oral temperature is 97.8 F (36.6 C). His blood pressure is 138/82 and his pulse is 55 (abnormal). His respiration is 18 and oxygen saturation is 100%.   Wt Readings from Last 3 Encounters:  11/09/16 174 lb 12.8 oz (79.3 kg)  10/07/16 174 lb 12 oz (79.3 kg)  09/06/16 174 lb (78.9 kg)    Head and neck exam shows no ocular or oral lesion. He has no palatal petechia. There are no palpable cervical or supraclavicular lymph nodes.  Lungs are clear. Cardiac exam regular rate and rhythm with no murmurs, rubs or bruits. Abdomen is soft. He has good bowel sounds. There is no fluid wave. There is no palpable liver or spleen tip. Back exam shows no tenderness over the spine, ribs or hips. Extremities shows no clubbing, cyanosis or edema. Skin exam shows some scattered petechia on his upper arms. He has rare small ecchymoses. Neurological exam shows no focal neurological deficits..   Lab Results  Component Value Date   WBC 5.4 11/09/2016   HGB 14.8 11/09/2016   HCT 41.6 11/09/2016   MCV 86 11/09/2016   PLT 98 Platelet count confirmed by slide estimate (L) 11/09/2016   No results found for: FERRITIN, IRON, TIBC, UIBC, IRONPCTSAT Lab Results  Component Value Date   RBC 4.83 11/09/2016   No results found for: KPAFRELGTCHN, LAMBDASER, KAPLAMBRATIO No results found for: IGGSERUM, IGA, IGMSERUM No results found for: Odetta Pink, SPEI   Chemistry      Component Value Date/Time   NA 143 10/06/2016 1508   K 4.2 10/06/2016 1508   CL 94 (L) 09/06/2016 1502   CO2 30 (H) 10/06/2016 1508   BUN 12.8 10/06/2016 1508   CREATININE 1.1 10/06/2016 1508  Component Value Date/Time   CALCIUM 9.3 10/06/2016 1508   ALKPHOS 76 10/06/2016 1508   AST 19 10/06/2016 1508   ALT 18 10/06/2016 1508   BILITOT 0.67 10/06/2016 1508     Impression and Plan: Robert Young is a 41 year old white male with chronic immune thrombocytopenia. He responded well to prednisone. He subsequently relapsed. He has responded again.  For now, he just wants to watch and take some steroids if he needs to. He does not want to do any splenectomy until fall if he can wait that long.  I'll go ahead and plan to have him get labs every 2 weeks.  I'll see him back in 2 months.  I think we concerned give him pulse steroids if necessary if his platelet count gets below 30,000.   Volanda Napoleon, MD 3/13/201812:44  PM

## 2016-11-22 ENCOUNTER — Other Ambulatory Visit (HOSPITAL_BASED_OUTPATIENT_CLINIC_OR_DEPARTMENT_OTHER): Payer: 59

## 2016-11-22 DIAGNOSIS — D693 Immune thrombocytopenic purpura: Secondary | ICD-10-CM | POA: Diagnosis not present

## 2016-11-22 LAB — CBC WITH DIFFERENTIAL/PLATELET
BASO%: 0.1 % (ref 0.0–2.0)
BASOS ABS: 0 10*3/uL (ref 0.0–0.1)
EOS ABS: 0.5 10*3/uL (ref 0.0–0.5)
EOS%: 6.6 % (ref 0.0–7.0)
HEMATOCRIT: 41.7 % (ref 38.4–49.9)
HEMOGLOBIN: 14.9 g/dL (ref 13.0–17.1)
LYMPH#: 2.4 10*3/uL (ref 0.9–3.3)
LYMPH%: 35.2 % (ref 14.0–49.0)
MCH: 30.8 pg (ref 27.2–33.4)
MCHC: 35.7 g/dL (ref 32.0–36.0)
MCV: 86.2 fL (ref 79.3–98.0)
MONO#: 0.5 10*3/uL (ref 0.1–0.9)
MONO%: 7.4 % (ref 0.0–14.0)
NEUT#: 3.5 10*3/uL (ref 1.5–6.5)
NEUT%: 50.7 % (ref 39.0–75.0)
Platelets: 109 10*3/uL — ABNORMAL LOW (ref 140–400)
RBC: 4.84 10*6/uL (ref 4.20–5.82)
RDW: 12.3 % (ref 11.0–14.6)
WBC: 6.9 10*3/uL (ref 4.0–10.3)

## 2016-12-06 ENCOUNTER — Other Ambulatory Visit (HOSPITAL_BASED_OUTPATIENT_CLINIC_OR_DEPARTMENT_OTHER): Payer: 59

## 2016-12-06 DIAGNOSIS — D693 Immune thrombocytopenic purpura: Secondary | ICD-10-CM

## 2016-12-06 LAB — CBC WITH DIFFERENTIAL/PLATELET
BASO%: 0.2 % (ref 0.0–2.0)
BASOS ABS: 0 10*3/uL (ref 0.0–0.1)
EOS%: 6.5 % (ref 0.0–7.0)
Eosinophils Absolute: 0.4 10*3/uL (ref 0.0–0.5)
HCT: 41.4 % (ref 38.4–49.9)
HEMOGLOBIN: 14.6 g/dL (ref 13.0–17.1)
LYMPH#: 1.7 10*3/uL (ref 0.9–3.3)
LYMPH%: 28.9 % (ref 14.0–49.0)
MCH: 30.2 pg (ref 27.2–33.4)
MCHC: 35.3 g/dL (ref 32.0–36.0)
MCV: 85.5 fL (ref 79.3–98.0)
MONO#: 0.4 10*3/uL (ref 0.1–0.9)
MONO%: 7.2 % (ref 0.0–14.0)
NEUT#: 3.3 10*3/uL (ref 1.5–6.5)
NEUT%: 57.2 % (ref 39.0–75.0)
Platelets: 107 10*3/uL — ABNORMAL LOW (ref 140–400)
RBC: 4.84 10*6/uL (ref 4.20–5.82)
RDW: 12.3 % (ref 11.0–14.6)
WBC: 5.8 10*3/uL (ref 4.0–10.3)

## 2016-12-20 ENCOUNTER — Other Ambulatory Visit (HOSPITAL_BASED_OUTPATIENT_CLINIC_OR_DEPARTMENT_OTHER): Payer: 59

## 2016-12-20 DIAGNOSIS — D693 Immune thrombocytopenic purpura: Secondary | ICD-10-CM | POA: Diagnosis not present

## 2016-12-20 LAB — CBC WITH DIFFERENTIAL/PLATELET
BASO%: 0.4 % (ref 0.0–2.0)
Basophils Absolute: 0 10*3/uL (ref 0.0–0.1)
EOS%: 6.3 % (ref 0.0–7.0)
Eosinophils Absolute: 0.4 10*3/uL (ref 0.0–0.5)
HCT: 44 % (ref 38.4–49.9)
HGB: 15 g/dL (ref 13.0–17.1)
LYMPH%: 24.6 % (ref 14.0–49.0)
MCH: 29.8 pg (ref 27.2–33.4)
MCHC: 34.1 g/dL (ref 32.0–36.0)
MCV: 87.4 fL (ref 79.3–98.0)
MONO#: 0.5 10*3/uL (ref 0.1–0.9)
MONO%: 7.9 % (ref 0.0–14.0)
NEUT%: 60.8 % (ref 39.0–75.0)
NEUTROS ABS: 4 10*3/uL (ref 1.5–6.5)
PLATELETS: 106 10*3/uL — AB (ref 140–400)
RBC: 5.04 10*6/uL (ref 4.20–5.82)
RDW: 12.5 % (ref 11.0–14.6)
WBC: 6.6 10*3/uL (ref 4.0–10.3)
lymph#: 1.6 10*3/uL (ref 0.9–3.3)

## 2017-01-03 ENCOUNTER — Other Ambulatory Visit (HOSPITAL_BASED_OUTPATIENT_CLINIC_OR_DEPARTMENT_OTHER): Payer: 59

## 2017-01-03 DIAGNOSIS — D693 Immune thrombocytopenic purpura: Secondary | ICD-10-CM | POA: Diagnosis not present

## 2017-01-03 LAB — CBC WITH DIFFERENTIAL/PLATELET
BASO%: 0.2 % (ref 0.0–2.0)
Basophils Absolute: 0 10*3/uL (ref 0.0–0.1)
EOS ABS: 0.5 10*3/uL (ref 0.0–0.5)
EOS%: 7.7 % — ABNORMAL HIGH (ref 0.0–7.0)
HCT: 42.6 % (ref 38.4–49.9)
HGB: 14.8 g/dL (ref 13.0–17.1)
LYMPH%: 25.1 % (ref 14.0–49.0)
MCH: 30 pg (ref 27.2–33.4)
MCHC: 34.7 g/dL (ref 32.0–36.0)
MCV: 86.6 fL (ref 79.3–98.0)
MONO#: 0.4 10*3/uL (ref 0.1–0.9)
MONO%: 7.2 % (ref 0.0–14.0)
NEUT#: 3.6 10*3/uL (ref 1.5–6.5)
NEUT%: 59.8 % (ref 39.0–75.0)
Platelets: 103 10*3/uL — ABNORMAL LOW (ref 140–400)
RBC: 4.92 10*6/uL (ref 4.20–5.82)
RDW: 12.3 % (ref 11.0–14.6)
WBC: 5.9 10*3/uL (ref 4.0–10.3)
lymph#: 1.5 10*3/uL (ref 0.9–3.3)

## 2017-01-11 ENCOUNTER — Ambulatory Visit (HOSPITAL_BASED_OUTPATIENT_CLINIC_OR_DEPARTMENT_OTHER): Payer: 59 | Admitting: Hematology & Oncology

## 2017-01-11 ENCOUNTER — Other Ambulatory Visit (HOSPITAL_BASED_OUTPATIENT_CLINIC_OR_DEPARTMENT_OTHER): Payer: 59

## 2017-01-11 VITALS — BP 134/78 | HR 55 | Temp 98.1°F | Wt 173.4 lb

## 2017-01-11 DIAGNOSIS — D693 Immune thrombocytopenic purpura: Secondary | ICD-10-CM | POA: Diagnosis not present

## 2017-01-11 LAB — CBC WITH DIFFERENTIAL (CANCER CENTER ONLY)
BASO#: 0 10*3/uL (ref 0.0–0.2)
BASO%: 0.2 % (ref 0.0–2.0)
EOS%: 10.3 % — AB (ref 0.0–7.0)
Eosinophils Absolute: 0.6 10*3/uL — ABNORMAL HIGH (ref 0.0–0.5)
HEMATOCRIT: 41.7 % (ref 38.7–49.9)
HEMOGLOBIN: 15 g/dL (ref 13.0–17.1)
LYMPH#: 1.5 10*3/uL (ref 0.9–3.3)
LYMPH%: 28.1 % (ref 14.0–48.0)
MCH: 30.4 pg (ref 28.0–33.4)
MCHC: 36 g/dL — AB (ref 32.0–35.9)
MCV: 85 fL (ref 82–98)
MONO#: 0.6 10*3/uL (ref 0.1–0.9)
MONO%: 10.8 % (ref 0.0–13.0)
NEUT%: 50.6 % (ref 40.0–80.0)
NEUTROS ABS: 2.8 10*3/uL (ref 1.5–6.5)
Platelets: 76 10*3/uL — ABNORMAL LOW (ref 145–400)
RBC: 4.93 10*6/uL (ref 4.20–5.70)
RDW: 11.9 % (ref 11.1–15.7)
WBC: 5.4 10*3/uL (ref 4.0–10.0)

## 2017-01-11 NOTE — Progress Notes (Signed)
Hematology and Oncology Follow Up Visit  Robert Young [id number] July 03, 1976 41 y.o. 01/11/2017   Principle Diagnosis:  Chronic immune thrombocytopenia  - Relapsed  Current Therapy:  Decadron 40 mg po q day x 4 days every 2 weeks Nplate - dose adjusted per pharmacy Famvir 500 mg po q day      Interim History:  Robert Young is here today for a follow-up. He is doing quite well. He had a little bit of a viral gastroneuritis last week.  He's had no bleeding or bruising. He's had no fever. He's had no cough. He's had no rashes.  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  He's had no leg swelling. He's had no change in bowel or bladder habits. He's had no mouth sores. He's had no headache.  Overall, his performance status is ECOG 0. .   Medications:  Allergies as of 01/11/2017      Reactions   Zyrtec [cetirizine]    BP elevated      Medication List    as of 01/11/2017 11:30 AM   You have not been prescribed any medications.     Allergies:  Allergies  Allergen Reactions  . Zyrtec [Cetirizine]     BP elevated    Past Medical History, Surgical history, Social history, and Family History were reviewed and updated.  Review of Systems: All other 10 point review of systems is negative.   Physical Exam:  weight is 173 lb 6.4 oz (78.7 kg). His oral temperature is 98.1 F (36.7 C). His blood pressure is 134/78 and his pulse is 55 (abnormal). His oxygen saturation is 100%.   Wt Readings from Last 3 Encounters:  01/11/17 173 lb 6.4 oz (78.7 kg)  11/09/16 174 lb 12.8 oz (79.3 kg)  10/07/16 174 lb 12 oz (79.3 kg)    Head and neck exam shows no ocular or oral lesion. He has no palatal petechia. There are no palpable cervical or supraclavicular lymph nodes. Lungs are clear. Cardiac exam regular rate and rhythm with no murmurs, rubs or bruits. Abdomen is soft. He has good bowel sounds. There is no fluid wave. There is no palpable liver  or spleen tip. Back exam shows no tenderness over the spine, ribs or hips. Extremities shows no clubbing, cyanosis or edema. Skin exam shows some scattered petechia on his upper arms. He has rare small ecchymoses. Neurological exam shows no focal neurological deficits..   Lab Results  Component Value Date   WBC 5.4 01/11/2017   HGB 15.0 01/11/2017   HCT 41.7 01/11/2017   MCV 85 01/11/2017   PLT 76 (L) 01/11/2017   No results found for: FERRITIN, IRON, TIBC, UIBC, IRONPCTSAT Lab Results  Component Value Date   RBC 4.93 01/11/2017   No results found for: KPAFRELGTCHN, LAMBDASER, KAPLAMBRATIO No results found for: IGGSERUM, IGA, IGMSERUM No results found for: Odetta Pink, SPEI   Chemistry      Component Value Date/Time   NA 142 11/09/2016 1145   K 4.3 11/09/2016 1145   CL 94 (L) 09/06/2016 1502   CO2 28 11/09/2016 1145   BUN 13.4 11/09/2016 1145   CREATININE 1.2 11/09/2016 1145      Component Value Date/Time   CALCIUM 9.5 11/09/2016 1145   ALKPHOS 60 11/09/2016 1145   AST 21 11/09/2016 1145   ALT 16 11/09/2016 1145   BILITOT 1.18 11/09/2016 1145     Impression  and Plan: Robert Young is a 41 year old white male with chronic immune thrombocytopenia. He responded well to prednisone. He subsequently relapsed. He has responded again.  Everything looks okay from my point of view. His [name] has down a little bit. However he is totally asymptomatic.  We will continue to follow his platelet counts every 3 weeks.  I would like to see him back in 6 weeks.  Volanda Napoleon, MD 5/15/201811:30 AM

## 2017-01-17 ENCOUNTER — Other Ambulatory Visit: Payer: 59

## 2017-01-19 ENCOUNTER — Other Ambulatory Visit (HOSPITAL_BASED_OUTPATIENT_CLINIC_OR_DEPARTMENT_OTHER): Payer: 59

## 2017-01-19 DIAGNOSIS — D693 Immune thrombocytopenic purpura: Secondary | ICD-10-CM | POA: Diagnosis not present

## 2017-01-19 LAB — COMPREHENSIVE METABOLIC PANEL
ALT: 18 U/L (ref 0–55)
AST: 21 U/L (ref 5–34)
Albumin: 4.6 g/dL (ref 3.5–5.0)
Alkaline Phosphatase: 53 U/L (ref 40–150)
Anion Gap: 11 mEq/L (ref 3–11)
BUN: 15.2 mg/dL (ref 7.0–26.0)
CALCIUM: 9.2 mg/dL (ref 8.4–10.4)
CHLORIDE: 106 meq/L (ref 98–109)
CO2: 26 meq/L (ref 22–29)
CREATININE: 1.4 mg/dL — AB (ref 0.7–1.3)
EGFR: 63 mL/min/{1.73_m2} — ABNORMAL LOW (ref >90)
Glucose: 79 mg/dl (ref 70–140)
Potassium: 4.3 mEq/L (ref 3.5–5.1)
Sodium: 143 mEq/L (ref 136–145)
Total Bilirubin: 0.92 mg/dL (ref 0.20–1.20)
Total Protein: 6.5 g/dL (ref 6.4–8.3)

## 2017-01-19 LAB — CBC WITH DIFFERENTIAL/PLATELET
BASO%: 0.2 % (ref 0.0–2.0)
Basophils Absolute: 0 10*3/uL (ref 0.0–0.1)
EOS%: 6.1 % (ref 0.0–7.0)
Eosinophils Absolute: 0.3 10*3/uL (ref 0.0–0.5)
HEMATOCRIT: 39.9 % (ref 38.4–49.9)
HGB: 14.3 g/dL (ref 13.0–17.1)
LYMPH#: 1.5 10*3/uL (ref 0.9–3.3)
LYMPH%: 26.9 % (ref 14.0–49.0)
MCH: 30.6 pg (ref 27.2–33.4)
MCHC: 35.8 g/dL (ref 32.0–36.0)
MCV: 85.4 fL (ref 79.3–98.0)
MONO#: 0.4 10*3/uL (ref 0.1–0.9)
MONO%: 6.6 % (ref 0.0–14.0)
NEUT%: 60.2 % (ref 39.0–75.0)
NEUTROS ABS: 3.3 10*3/uL (ref 1.5–6.5)
Platelets: 76 10*3/uL — ABNORMAL LOW (ref 140–400)
RBC: 4.67 10*6/uL (ref 4.20–5.82)
RDW: 12.3 % (ref 11.0–14.6)
WBC: 5.4 10*3/uL (ref 4.0–10.3)
nRBC: 0 % (ref 0–0)

## 2017-01-31 ENCOUNTER — Other Ambulatory Visit (HOSPITAL_BASED_OUTPATIENT_CLINIC_OR_DEPARTMENT_OTHER): Payer: 59

## 2017-01-31 DIAGNOSIS — D693 Immune thrombocytopenic purpura: Secondary | ICD-10-CM | POA: Diagnosis not present

## 2017-01-31 LAB — CBC WITH DIFFERENTIAL/PLATELET
BASO%: 0.2 % (ref 0.0–2.0)
Basophils Absolute: 0 10*3/uL (ref 0.0–0.1)
EOS%: 6.7 % (ref 0.0–7.0)
Eosinophils Absolute: 0.4 10*3/uL (ref 0.0–0.5)
HCT: 40.5 % (ref 38.4–49.9)
HGB: 14.5 g/dL (ref 13.0–17.1)
LYMPH%: 26 % (ref 14.0–49.0)
MCH: 30.5 pg (ref 27.2–33.4)
MCHC: 35.8 g/dL (ref 32.0–36.0)
MCV: 85.1 fL (ref 79.3–98.0)
MONO#: 0.6 10*3/uL (ref 0.1–0.9)
MONO%: 9.6 % (ref 0.0–14.0)
NEUT%: 57.5 % (ref 39.0–75.0)
NEUTROS ABS: 3.4 10*3/uL (ref 1.5–6.5)
Platelets: 65 10*3/uL — ABNORMAL LOW (ref 140–400)
RBC: 4.76 10*6/uL (ref 4.20–5.82)
RDW: 12.3 % (ref 11.0–14.6)
WBC: 5.9 10*3/uL (ref 4.0–10.3)
lymph#: 1.5 10*3/uL (ref 0.9–3.3)

## 2017-02-14 ENCOUNTER — Other Ambulatory Visit (HOSPITAL_BASED_OUTPATIENT_CLINIC_OR_DEPARTMENT_OTHER): Payer: 59

## 2017-02-14 DIAGNOSIS — D693 Immune thrombocytopenic purpura: Secondary | ICD-10-CM

## 2017-02-14 LAB — CBC WITH DIFFERENTIAL/PLATELET
BASO%: 0.2 % (ref 0.0–2.0)
BASOS ABS: 0 10*3/uL (ref 0.0–0.1)
EOS ABS: 0.3 10*3/uL (ref 0.0–0.5)
EOS%: 5.2 % (ref 0.0–7.0)
HEMATOCRIT: 42.7 % (ref 38.4–49.9)
HGB: 14.7 g/dL (ref 13.0–17.1)
LYMPH%: 23.1 % (ref 14.0–49.0)
MCH: 29.9 pg (ref 27.2–33.4)
MCHC: 34.5 g/dL (ref 32.0–36.0)
MCV: 86.7 fL (ref 79.3–98.0)
MONO#: 0.5 10*3/uL (ref 0.1–0.9)
MONO%: 8.7 % (ref 0.0–14.0)
NEUT#: 3.8 10*3/uL (ref 1.5–6.5)
NEUT%: 62.8 % (ref 39.0–75.0)
PLATELETS: 67 10*3/uL — AB (ref 140–400)
RBC: 4.93 10*6/uL (ref 4.20–5.82)
RDW: 12.7 % (ref 11.0–14.6)
WBC: 6.1 10*3/uL (ref 4.0–10.3)
lymph#: 1.4 10*3/uL (ref 0.9–3.3)

## 2017-03-14 ENCOUNTER — Other Ambulatory Visit (HOSPITAL_BASED_OUTPATIENT_CLINIC_OR_DEPARTMENT_OTHER): Payer: 59

## 2017-03-14 DIAGNOSIS — D693 Immune thrombocytopenic purpura: Secondary | ICD-10-CM | POA: Diagnosis not present

## 2017-03-14 LAB — CBC WITH DIFFERENTIAL/PLATELET
BASO%: 0.2 % (ref 0.0–2.0)
Basophils Absolute: 0 10*3/uL (ref 0.0–0.1)
EOS ABS: 0.3 10*3/uL (ref 0.0–0.5)
EOS%: 5 % (ref 0.0–7.0)
HCT: 40.4 % (ref 38.4–49.9)
HGB: 14.4 g/dL (ref 13.0–17.1)
LYMPH%: 23.9 % (ref 14.0–49.0)
MCH: 31 pg (ref 27.2–33.4)
MCHC: 35.6 g/dL (ref 32.0–36.0)
MCV: 86.9 fL (ref 79.3–98.0)
MONO#: 0.5 10*3/uL (ref 0.1–0.9)
MONO%: 7.6 % (ref 0.0–14.0)
NEUT%: 63.3 % (ref 39.0–75.0)
NEUTROS ABS: 4.1 10*3/uL (ref 1.5–6.5)
PLATELETS: 64 10*3/uL — AB (ref 140–400)
RBC: 4.65 10*6/uL (ref 4.20–5.82)
RDW: 12.8 % (ref 11.0–14.6)
WBC: 6.4 10*3/uL (ref 4.0–10.3)
lymph#: 1.5 10*3/uL (ref 0.9–3.3)

## 2017-03-16 ENCOUNTER — Ambulatory Visit (HOSPITAL_BASED_OUTPATIENT_CLINIC_OR_DEPARTMENT_OTHER): Payer: 59 | Admitting: Hematology & Oncology

## 2017-03-16 VITALS — BP 134/80 | HR 58 | Temp 98.1°F | Resp 18 | Wt 178.0 lb

## 2017-03-16 DIAGNOSIS — D693 Immune thrombocytopenic purpura: Secondary | ICD-10-CM | POA: Diagnosis not present

## 2017-03-16 NOTE — Progress Notes (Signed)
Hematology and Oncology Follow Up Visit  CLEDIS SOHN [id number] 14-Dec-1975 41 y.o. 03/16/2017   Principle Diagnosis:  Chronic immune thrombocytopenia  - Relapsed  Current Therapy:  Decadron 40 mg po q day x 4 days every 2 weeks Nplate - dose adjusted per pharmacy Famvir 500 mg po q day      Interim History:  Mr. Gains is here today for a follow-up. He is doing quite well. He's had a good summer. He went to [place] and [place]. His younger sister got married.  His daughter's birthday is today. He has to get home for the birthday party.  He's had no problems with bleeding or bruising. He is still working full-time. 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  He's had no problems with exercising. He's had no fatigue or weakness.  He's had no change in bowel or bladder habits.  He has had no rashes. He does not have any leg swelling.  Overall, his performance status is ECOG 0. .   Medications:  Allergies as of 03/16/2017      Reactions   Zyrtec [cetirizine]    BP elevated      Medication List    as of 03/16/2017  4:32 PM   You have not been prescribed any medications.     Allergies:  Allergies  Allergen Reactions  . Zyrtec [Cetirizine]     BP elevated    Past Medical History, Surgical history, Social history, and Family History were reviewed and updated.  Review of Systems: All other 10 point review of systems is negative.   Physical Exam:  weight is 178 lb (80.7 kg). His oral temperature is 98.1 F (36.7 C). His blood pressure is 134/80 and his pulse is 58 (abnormal). His respiration is 18 and oxygen saturation is 100%.   Wt Readings from Last 3 Encounters:  03/16/17 178 lb (80.7 kg)  01/11/17 173 lb 6.4 oz (78.7 kg)  11/09/16 174 lb 12.8 oz (79.3 kg)    Head and neck exam shows no ocular or oral lesion. He has no palatal petechia. There are no palpable cervical or supraclavicular lymph nodes. Lungs are clear. Cardiac exam  regular rate and rhythm with no murmurs, rubs or bruits. Abdomen is soft. He has good bowel sounds. There is no fluid wave. There is no palpable liver or spleen tip. Back exam shows no tenderness over the spine, ribs or hips. Extremities shows no clubbing, cyanosis or edema. Skin exam shows some scattered petechia on his upper arms. He has rare small ecchymoses. Neurological exam shows no focal neurological deficits..   Lab Results  Component Value Date   WBC 6.4 03/14/2017   HGB 14.4 03/14/2017   HCT 40.4 03/14/2017   MCV 86.9 03/14/2017   PLT 64 (L) 03/14/2017   No results found for: FERRITIN, IRON, TIBC, UIBC, IRONPCTSAT Lab Results  Component Value Date   RBC 4.65 03/14/2017   No results found for: KPAFRELGTCHN, LAMBDASER, KAPLAMBRATIO No results found for: IGGSERUM, IGA, IGMSERUM No results found for: TOTALPROTELP, ALBUMINELP, A1GS, A2GS, Violet Baldy, MSPIKE, SPEI   Chemistry      Component Value Date/Time   NA 143 01/19/2017 1520   K 4.3 01/19/2017 1520   CL 94 (L) 09/06/2016 1502   CO2 26 01/19/2017 1520   BUN 15.2 01/19/2017 1520   CREATININE 1.4 (H) 01/19/2017 1520      Component Value Date/Time   CALCIUM 9.2 01/19/2017 1520   ALKPHOS  53 01/19/2017 1520   AST 21 01/19/2017 1520   ALT 18 01/19/2017 1520   BILITOT 0.92 01/19/2017 1520     Impression and Plan: Mr. Linsey is a 41 year old white male with chronic immune thrombocytopenia.   His plan to count is slowly dropping. He is still totally asymptomatic.  He is certainly well educated and knows when there is a problem.  I we can probably check his blood and 6 weeks. If he does have a drop in his platelet count, we can was get up on hold Decadron which always works.  I'll see him back in 3 months.  Volanda Napoleon, MD 7/18/20184:32 PM

## 2017-03-28 ENCOUNTER — Other Ambulatory Visit: Payer: 59

## 2017-04-11 ENCOUNTER — Other Ambulatory Visit (HOSPITAL_BASED_OUTPATIENT_CLINIC_OR_DEPARTMENT_OTHER): Payer: 59

## 2017-04-11 DIAGNOSIS — D693 Immune thrombocytopenic purpura: Secondary | ICD-10-CM | POA: Diagnosis not present

## 2017-04-11 LAB — CBC WITH DIFFERENTIAL/PLATELET
BASO%: 0.2 % (ref 0.0–2.0)
Basophils Absolute: 0 10*3/uL (ref 0.0–0.1)
EOS ABS: 0.3 10*3/uL (ref 0.0–0.5)
EOS%: 4.7 % (ref 0.0–7.0)
HCT: 42.9 % (ref 38.4–49.9)
HEMOGLOBIN: 14.7 g/dL (ref 13.0–17.1)
LYMPH%: 22.8 % (ref 14.0–49.0)
MCH: 30.4 pg (ref 27.2–33.4)
MCHC: 34.2 g/dL (ref 32.0–36.0)
MCV: 89 fL (ref 79.3–98.0)
MONO#: 0.6 10*3/uL (ref 0.1–0.9)
MONO%: 8.1 % (ref 0.0–14.0)
NEUT#: 4.5 10*3/uL (ref 1.5–6.5)
NEUT%: 64.2 % (ref 39.0–75.0)
Platelets: 60 10*3/uL — ABNORMAL LOW (ref 140–400)
RBC: 4.81 10*6/uL (ref 4.20–5.82)
RDW: 12.6 % (ref 11.0–14.6)
WBC: 7 10*3/uL (ref 4.0–10.3)
lymph#: 1.6 10*3/uL (ref 0.9–3.3)

## 2017-04-25 ENCOUNTER — Other Ambulatory Visit (HOSPITAL_BASED_OUTPATIENT_CLINIC_OR_DEPARTMENT_OTHER): Payer: 59

## 2017-04-25 DIAGNOSIS — D693 Immune thrombocytopenic purpura: Secondary | ICD-10-CM

## 2017-04-25 LAB — CBC WITH DIFFERENTIAL/PLATELET
BASO%: 0.2 % (ref 0.0–2.0)
BASOS ABS: 0 10*3/uL (ref 0.0–0.1)
EOS ABS: 0.4 10*3/uL (ref 0.0–0.5)
EOS%: 5.7 % (ref 0.0–7.0)
HEMATOCRIT: 40.4 % (ref 38.4–49.9)
HEMOGLOBIN: 14.4 g/dL (ref 13.0–17.1)
LYMPH%: 26 % (ref 14.0–49.0)
MCH: 31 pg (ref 27.2–33.4)
MCHC: 35.6 g/dL (ref 32.0–36.0)
MCV: 87.1 fL (ref 79.3–98.0)
MONO#: 0.5 10*3/uL (ref 0.1–0.9)
MONO%: 7.9 % (ref 0.0–14.0)
NEUT#: 3.8 10*3/uL (ref 1.5–6.5)
NEUT%: 60.2 % (ref 39.0–75.0)
Platelets: 70 10*3/uL — ABNORMAL LOW (ref 140–400)
RBC: 4.64 10*6/uL (ref 4.20–5.82)
RDW: 12.2 % (ref 11.0–14.6)
WBC: 6.3 10*3/uL (ref 4.0–10.3)
lymph#: 1.7 10*3/uL (ref 0.9–3.3)

## 2017-05-13 ENCOUNTER — Encounter: Payer: Self-pay | Admitting: Hematology & Oncology

## 2017-05-19 ENCOUNTER — Other Ambulatory Visit (HOSPITAL_BASED_OUTPATIENT_CLINIC_OR_DEPARTMENT_OTHER): Payer: 59

## 2017-05-19 ENCOUNTER — Encounter: Payer: Self-pay | Admitting: *Deleted

## 2017-05-19 DIAGNOSIS — D693 Immune thrombocytopenic purpura: Secondary | ICD-10-CM | POA: Diagnosis not present

## 2017-05-19 LAB — CBC WITH DIFFERENTIAL/PLATELET
BASO%: 0.4 % (ref 0.0–2.0)
BASOS ABS: 0 10*3/uL (ref 0.0–0.1)
EOS%: 9.3 % — AB (ref 0.0–7.0)
Eosinophils Absolute: 0.5 10*3/uL (ref 0.0–0.5)
HEMATOCRIT: 42.9 % (ref 38.4–49.9)
HGB: 15.1 g/dL (ref 13.0–17.1)
LYMPH#: 1.6 10*3/uL (ref 0.9–3.3)
LYMPH%: 29.5 % (ref 14.0–49.0)
MCH: 30.9 pg (ref 27.2–33.4)
MCHC: 35.3 g/dL (ref 32.0–36.0)
MCV: 87.5 fL (ref 79.3–98.0)
MONO#: 0.4 10*3/uL (ref 0.1–0.9)
MONO%: 7.7 % (ref 0.0–14.0)
NEUT#: 2.9 10*3/uL (ref 1.5–6.5)
NEUT%: 53.1 % (ref 39.0–75.0)
PLATELETS: 77 10*3/uL — AB (ref 140–400)
RBC: 4.9 10*6/uL (ref 4.20–5.82)
RDW: 12.4 % (ref 11.0–14.6)
WBC: 5.5 10*3/uL (ref 4.0–10.3)

## 2017-05-20 ENCOUNTER — Ambulatory Visit (HOSPITAL_BASED_OUTPATIENT_CLINIC_OR_DEPARTMENT_OTHER): Payer: 59 | Admitting: Hematology & Oncology

## 2017-05-20 VITALS — BP 143/82 | HR 57 | Temp 98.1°F | Resp 16 | Wt 174.0 lb

## 2017-05-20 DIAGNOSIS — D693 Immune thrombocytopenic purpura: Secondary | ICD-10-CM

## 2017-05-20 NOTE — Progress Notes (Signed)
Hematology and Oncology Follow Up Visit  TORRIN CRIHFIELD [id number] 1975/12/26 41 y.o. 05/20/2017   Principle Diagnosis:  Chronic immune thrombocytopenia  - Relapsed  Current Therapy:  Decadron 40 mg po q day x 4 days every 2 weeks - as needed Nplate - dose adjusted per pharmacy - as indicated     Interim History:  Mr. Badolato is here today for a follow-up. He is doing quite well. He's had no complaints. He's had no problems with bleeding or bruising.  He has had a good summer. He is family have been on vacation.  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  He has had no problems with cough. There's no fever. He's had no nausea or vomiting. He's had no leg swelling.  He actually is going to the [facility] versus [employer] football game tomorrow. Hopefully I will see him there.  Currently, his performance status is ECOG 0.,   Medications:  Allergies as of 05/20/2017      Reactions   Zyrtec [cetirizine]    BP elevated      Medication List    as of 05/20/2017  4:52 PM   You have not been prescribed any medications.          Discharge Care Instructions        Start     Ordered   05/20/17 0000  CBC with Differential ([facility])     05/20/17 1528   05/20/17 0000  CMP STAT ([facility] only)     05/20/17 1528   05/20/17 0000  Platelet by Citrate     05/20/17 1528   05/20/17 0000  Lactate dehydrogenase     05/20/17 1528   05/20/17 0000  [facility] - Smear     05/20/17 1528   05/20/17 0000  CBC with Differential ([facility])     05/20/17 1529   05/20/17 0000  Platelet by Citrate     05/20/17 1529      Allergies:  Allergies  Allergen Reactions  . Zyrtec [Cetirizine]     BP elevated    Past Medical History, Surgical history, Social history, and Family History were reviewed and updated.  Review of Systems: As stated in the interim history  Physical Exam:  weight is 174 lb (78.9 kg). His oral temperature is  98.1 F (36.7 C). His blood pressure is 143/82 (abnormal) and his pulse is 57 (abnormal). His respiration is 16 and oxygen saturation is 100%.   Wt Readings from Last 3 Encounters:  05/20/17 174 lb (78.9 kg)  03/16/17 178 lb (80.7 kg)  01/11/17 173 lb 6.4 oz (78.7 kg)    Head and neck exam shows no ocular or oral lesions. There is no palatal petechia. Neck is supple with no adenopathy. Lungs are clear bilaterally. Cardiac exam regular rate and rhythm with no murmurs, rubs or bruits. Abdomen is soft. He has good bowel sounds. There is no fluid wave. There is no guarding or rebound tenderness. There is no palpable liver or spleen tip. Back exam shows no tenderness over the spine, ribs or hips. Extremities shows no clubbing, cyanosis or edema. Skin exam shows no rashes, ecchymoses or petechia. Neurological exam shows no focal neurological deficits.  Lab Results  Component Value Date   WBC 5.5 05/19/2017   HGB 15.1 05/19/2017   HCT 42.9 05/19/2017   MCV 87.5 05/19/2017   PLT 77 (L) 05/19/2017   No results found for: FERRITIN, IRON,  TIBC, UIBC, IRONPCTSAT Lab Results  Component Value Date   RBC 4.90 05/19/2017   No results found for: KPAFRELGTCHN, LAMBDASER, KAPLAMBRATIO No results found for: IGGSERUM, IGA, IGMSERUM No results found for: Odetta Pink, SPEI   Chemistry      Component Value Date/Time   NA 143 01/19/2017 1520   K 4.3 01/19/2017 1520   CL 94 (L) 09/06/2016 1502   CO2 26 01/19/2017 1520   BUN 15.2 01/19/2017 1520   CREATININE 1.4 (H) 01/19/2017 1520      Component Value Date/Time   CALCIUM 9.2 01/19/2017 1520   ALKPHOS 53 01/19/2017 1520   AST 21 01/19/2017 1520   ALT 18 01/19/2017 1520   BILITOT 0.92 01/19/2017 1520     Impression and Plan: Mr. Sakuma is a 41 year old white male. He has relapsed immune thrombocytopenia. His platelet count actually is doing quite well for him.  I really do not see any need to  intervene with any type of surgical intervention (i.e. splenectomy).   I think we can move his follow-ups out a little bit. I think we can have his blood checked in 6 weeks. I will see him back in 3 months.  Since he works at the [employer], he knows that he Mamie Nick is get labs done there if he starts having issues with bruising or bleeding or rashes. Volanda Napoleon, MD 9/21/20184:52 PM

## 2017-05-23 DIAGNOSIS — H52222 Regular astigmatism, left eye: Secondary | ICD-10-CM | POA: Diagnosis not present

## 2017-05-23 DIAGNOSIS — H5211 Myopia, right eye: Secondary | ICD-10-CM | POA: Diagnosis not present

## 2017-05-23 DIAGNOSIS — H5202 Hypermetropia, left eye: Secondary | ICD-10-CM | POA: Diagnosis not present

## 2017-05-23 DIAGNOSIS — H524 Presbyopia: Secondary | ICD-10-CM | POA: Diagnosis not present

## 2017-07-01 ENCOUNTER — Other Ambulatory Visit (HOSPITAL_BASED_OUTPATIENT_CLINIC_OR_DEPARTMENT_OTHER): Payer: 59

## 2017-07-01 DIAGNOSIS — D693 Immune thrombocytopenic purpura: Secondary | ICD-10-CM | POA: Diagnosis not present

## 2017-07-01 LAB — CBC WITH DIFFERENTIAL/PLATELET
BASO%: 0.2 % (ref 0.0–2.0)
BASOS ABS: 0 10*3/uL (ref 0.0–0.1)
EOS%: 4.5 % (ref 0.0–7.0)
Eosinophils Absolute: 0.2 10*3/uL (ref 0.0–0.5)
HEMATOCRIT: 39.7 % (ref 38.4–49.9)
HGB: 14 g/dL (ref 13.0–17.1)
LYMPH#: 1.4 10*3/uL (ref 0.9–3.3)
LYMPH%: 27.5 % (ref 14.0–49.0)
MCH: 30.9 pg (ref 27.2–33.4)
MCHC: 35.3 g/dL (ref 32.0–36.0)
MCV: 87.6 fL (ref 79.3–98.0)
MONO#: 0.3 10*3/uL (ref 0.1–0.9)
MONO%: 5.9 % (ref 0.0–14.0)
NEUT#: 3 10*3/uL (ref 1.5–6.5)
NEUT%: 61.9 % (ref 39.0–75.0)
PLATELETS: 95 10*3/uL — AB (ref 140–400)
RBC: 4.53 10*6/uL (ref 4.20–5.82)
RDW: 12 % (ref 11.0–14.6)
WBC: 4.9 10*3/uL (ref 4.0–10.3)

## 2017-07-01 LAB — COMPREHENSIVE METABOLIC PANEL
ALK PHOS: 61 U/L (ref 40–150)
ALT: 13 U/L (ref 0–55)
ANION GAP: 9 meq/L (ref 3–11)
AST: 19 U/L (ref 5–34)
Albumin: 4.2 g/dL (ref 3.5–5.0)
BILIRUBIN TOTAL: 0.66 mg/dL (ref 0.20–1.20)
BUN: 12.4 mg/dL (ref 7.0–26.0)
CALCIUM: 9.1 mg/dL (ref 8.4–10.4)
CHLORIDE: 103 meq/L (ref 98–109)
CO2: 30 mEq/L — ABNORMAL HIGH (ref 22–29)
CREATININE: 1.1 mg/dL (ref 0.7–1.3)
EGFR: >60 mL/min/{1.73_m2} (ref >60)
Glucose: 82 mg/dl (ref 70–140)
Potassium: 4.1 mEq/L (ref 3.5–5.1)
Sodium: 142 mEq/L (ref 136–145)
Total Protein: 6.8 g/dL (ref 6.4–8.3)

## 2017-07-01 LAB — LACTATE DEHYDROGENASE: LDH: 168 U/L (ref 125–245)

## 2017-08-13 ENCOUNTER — Encounter: Payer: Self-pay | Admitting: Emergency Medicine

## 2017-08-13 ENCOUNTER — Emergency Department
Admission: EM | Admit: 2017-08-13 | Discharge: 2017-08-13 | Disposition: A | Discharge status: Home / Self Care | Payer: 59 | Source: Home / Self Care | Attending: Family Medicine | Admitting: Family Medicine

## 2017-08-13 DIAGNOSIS — B349 Viral infection, unspecified: Secondary | ICD-10-CM

## 2017-08-13 HISTORY — DX: Essential (hemorrhagic) thrombocythemia: D47.3

## 2017-08-13 LAB — POCT INFLUENZA A/B
INFLUENZA A, POC: NEGATIVE
INFLUENZA B, POC: NEGATIVE

## 2017-08-13 MED ORDER — ACETAMINOPHEN 325 MG PO TABS
650.0000 mg | ORAL_TABLET | Freq: Once | ORAL | Status: AC
  Administered 2017-08-13: 650 mg via ORAL

## 2017-08-13 NOTE — Discharge Instructions (Signed)
°  You may take 500mg acetaminophen every 4-6 hours or in combination with ibuprofen 400-600mg every 6-8 hours as needed for pain, inflammation, and fever. ° °Be sure to drink at least eight 8oz glasses of water to stay well hydrated and get at least 8 hours of sleep at night, preferably more while sick.  ° °

## 2017-08-13 NOTE — ED Provider Notes (Signed)
[name]    CSN: [id number] Arrival date & time: 08/13/17  1603     History   Chief Complaint Chief Complaint  Patient presents with  . Fever    HPI Robert Young is a 41 y.o. male.   HPI  Robert Young is a 41 y.o. male presenting to UC with c/o sudden onset fever Tmax 101*F associated body aches, HA, fatigue and non-productive cough. No known sick contacts. Denies n/v/d. No medication taken PTA. He did get the flu vaccine this season.    Past Medical History:  Diagnosis Date  . Allergy   . Idiopathic thrombocythemia [facility])     Patient Active Problem List   Diagnosis Date Noted  . Idiopathic thrombocytopenic purpura ([place]) 07/28/2015  . Allergic rhinitis 03/30/2010    Past Surgical History:  Procedure Laterality Date  . TONSILLECTOMY    . TONSILLECTOMY AND ADENOIDECTOMY         Home Medications    Prior to Admission medications   Not on File    Family History Family History  Problem Relation Age of Onset  . Heart disease Father   . Hyperlipidemia Father   . Hypertension Father   . Stroke Father   . Heart disease Paternal Uncle   . Cancer Paternal Grandmother        breast    Social History Social History   Tobacco Use  . Smoking status: Never Smoker  . Smokeless tobacco: Former Systems developer    Types: Chew  . Tobacco comment: NEVER USED TOBACCO  Substance Use Topics  . Alcohol use: Yes    Alcohol/week: 0.0 oz  . Drug use: No     Allergies   Zyrtec [cetirizine]   Review of Systems Review of Systems  Constitutional: Positive for appetite change, fatigue and fever. Negative for chills.  HENT: Positive for congestion. Negative for ear pain, sore throat, trouble swallowing and voice change.   Respiratory: Positive for cough. Negative for shortness of breath.   Cardiovascular: Negative for chest pain and palpitations.  Gastrointestinal: Negative for abdominal pain, diarrhea, nausea and vomiting.  Musculoskeletal: Positive for  arthralgias, back pain and myalgias.  Skin: Negative for rash.  Neurological: Positive for headaches. Negative for dizziness and light-headedness.     Physical Exam Triage Vital Signs ED Triage Vitals  Enc Vitals Group     BP 08/13/17 1624 116/69     Pulse Rate 08/13/17 1624 81     Resp --      Temp 08/13/17 1624 (!) 100.9 F (38.3 C)     Temp Source 08/13/17 1624 Oral     SpO2 08/13/17 1624 100 %     Weight 08/13/17 1625 179 lb 8 oz (81.4 kg)     Height 08/13/17 1625 5\' 9"  (1.753 m)     Head Circumference --      Peak Flow --      Pain Score 08/13/17 1625 0     Pain Loc --      Pain Edu? --      Excl. in [place]? --    No data found.  Updated Vital Signs BP 116/69 (BP Location: Right Arm)   Pulse 81   Temp (!) 100.9 F (38.3 C) (Oral)   Ht 5\' 9"  (1.753 m)   Wt 179 lb 8 oz (81.4 kg)   SpO2 100%   BMI 26.51 kg/m   Visual Acuity Right Eye Distance:   Left Eye Distance:   Bilateral Distance:  Right Eye Near:   Left Eye Near:    Bilateral Near:     Physical Exam  Constitutional: He is oriented to person, place, and time. He appears well-developed and well-nourished. No distress.  HENT:  Head: Normocephalic and atraumatic.  Right Ear: Tympanic membrane normal.  Left Ear: Tympanic membrane normal.  Nose: Nose normal. Right sinus exhibits no maxillary sinus tenderness and no frontal sinus tenderness. Left sinus exhibits no maxillary sinus tenderness and no frontal sinus tenderness.  Mouth/Throat: Uvula is midline, oropharynx is clear and moist and mucous membranes are normal.  Eyes: EOM are normal.  Neck: Normal range of motion. Neck supple.  Cardiovascular: Normal rate and regular rhythm.  Pulmonary/Chest: Effort normal and breath sounds normal. No stridor. No respiratory distress. He has no wheezes. He has no rales.  Musculoskeletal: Normal range of motion.  Lymphadenopathy:    He has no cervical adenopathy.  Neurological: He is alert and oriented to person,  place, and time.  Skin: Skin is warm and dry. He is not diaphoretic.  Psychiatric: He has a normal mood and affect. His behavior is normal.  Nursing note and vitals reviewed.    UC Treatments / Results  Labs (all labs ordered are listed, but only abnormal results are displayed) Labs Reviewed  POCT INFLUENZA A/B    EKG  EKG Interpretation None       Radiology No results found.  Procedures Procedures (including critical care time)  Medications Ordered in UC Medications  acetaminophen (TYLENOL) tablet 650 mg (650 mg Oral Given 08/13/17 1629)     Initial Impression / Assessment and Plan / UC Course  I have reviewed the triage vital signs and the nursing notes.  Pertinent labs & imaging results that were available during my care of the patient were reviewed by me and considered in my medical decision making (see chart for details).     Rapid flu: NEGATIVE  Symptoms likely viral in nature Home care instructions provided F/u with PCP in 1 week if not improving, sooner if significantly worsening.   Final Clinical Impressions(s) / UC Diagnoses   Final diagnoses:  Viral illness    ED Discharge Orders    None       Controlled Substance Prescriptions [place] Controlled Substance Registry consulted? Not Applicable   Noe Gens, PA-C 08/14/17 1344

## 2017-08-13 NOTE — ED Triage Notes (Signed)
Patient complaining of fever today, headache, body aches, fatigue, non-productive cough.

## 2017-08-19 ENCOUNTER — Encounter: Payer: Self-pay | Admitting: Physician Assistant

## 2017-08-19 ENCOUNTER — Other Ambulatory Visit: Payer: 59

## 2017-08-19 ENCOUNTER — Ambulatory Visit (INDEPENDENT_AMBULATORY_CARE_PROVIDER_SITE_OTHER): Payer: 59 | Admitting: Physician Assistant

## 2017-08-19 ENCOUNTER — Ambulatory Visit: Payer: 59 | Admitting: Hematology & Oncology

## 2017-08-19 ENCOUNTER — Ambulatory Visit (INDEPENDENT_AMBULATORY_CARE_PROVIDER_SITE_OTHER): Payer: 59

## 2017-08-19 VITALS — BP 112/74 | HR 66 | Temp 97.8°F | Resp 16 | Ht 69.0 in | Wt 177.6 lb

## 2017-08-19 DIAGNOSIS — J181 Lobar pneumonia, unspecified organism: Secondary | ICD-10-CM

## 2017-08-19 DIAGNOSIS — J310 Chronic rhinitis: Secondary | ICD-10-CM | POA: Diagnosis not present

## 2017-08-19 DIAGNOSIS — R05 Cough: Secondary | ICD-10-CM

## 2017-08-19 DIAGNOSIS — R918 Other nonspecific abnormal finding of lung field: Secondary | ICD-10-CM | POA: Diagnosis not present

## 2017-08-19 DIAGNOSIS — R053 Chronic cough: Secondary | ICD-10-CM | POA: Insufficient documentation

## 2017-08-19 MED ORDER — HYDROCOD POLST-CPM POLST ER 10-8 MG/5ML PO SUER: 5.0000 mL | Freq: Every evening | ORAL | 0 refills | Status: DC | PRN

## 2017-08-19 MED ORDER — BENZONATATE 200 MG PO CAPS: 200.0000 mg | ORAL_CAPSULE | Freq: Three times a day (TID) | ORAL | 0 refills | Status: DC | PRN

## 2017-08-19 MED ORDER — AZITHROMYCIN 250 MG PO TABS: ORAL_TABLET | ORAL | 0 refills | Status: DC

## 2017-08-19 MED FILL — AZITHROMYCIN 250 MG TAB: 250 | 5 days supply | Qty: 6 | Fill #0

## 2017-08-19 MED FILL — HYDROCODONE-CHLORPHENIRAM S: 10-8 | 23 days supply | Qty: 115 | Fill #0

## 2017-08-19 MED FILL — BENZONATATE 200 MG CAP: 200 | 15 days supply | Qty: 45 | Fill #0

## 2017-08-19 NOTE — Progress Notes (Signed)
HPI:                                                                Robert Young is a 41 y.o. male who presents to [facility] today for persistent cough  Patient reports cough beginning with cold symptoms during Thanksgiving. States cough never resolved. Approximately 1 week ago developed fever and flu-like symptoms and states cough has been gradually worsening since that time. He was seen in urgent care on 08/13/17, influenza testing was negative and he was provided supportive care for viral illness.   Cough  This is a new problem. The current episode started 1 to 4 weeks ago. The problem has been gradually worsening. The problem occurs hourly. The cough is non-productive. Associated symptoms include rhinorrhea. Pertinent negatives include no fever, nasal congestion, sore throat, shortness of breath or wheezing. The symptoms are aggravated by cold air and lying down. Treatments tried: anithistamine, nasal steroid, mucinex. The treatment provided mild relief. His past medical history is significant for asthma (childhood). There is no history of COPD.     Past Medical History:  Diagnosis Date  . Allergy   . Idiopathic thrombocythemia ([place])    Past Surgical History:  Procedure Laterality Date  . TONSILLECTOMY    . TONSILLECTOMY AND ADENOIDECTOMY     Social History   Tobacco Use  . Smoking status: Never Smoker  . Smokeless tobacco: Former Systems developer    Types: Chew  . Tobacco comment: NEVER USED TOBACCO  Substance Use Topics  . Alcohol use: Yes    Alcohol/week: 0.0 oz   family history includes Cancer in his paternal grandmother; Heart disease in his father and paternal uncle; Hyperlipidemia in his father; Hypertension in his father; Stroke in his father.  ROS: negative except as noted in the HPI  Medications: Current Outpatient Medications  Medication Sig Dispense Refill  . fluticasone (FLONASE) 50 MCG/ACT nasal spray Place into both  nostrils daily.    [name] loratadine (CLARITIN) 10 MG tablet Take 10 mg by mouth daily.    [name] azithromycin (ZITHROMAX Z-PAK) 250 MG tablet Take 2 tablets (500 mg) on  Day 1,  followed by 1 tablet (250 mg) once daily on Days 2 through 5. 6 tablet 0  . benzonatate (TESSALON) 200 MG capsule Take 1 capsule (200 mg total) by mouth 3 (three) times daily as needed for cough. 45 capsule 0  . chlorpheniramine-HYDROcodone (TUSSIONEX) 10-8 MG/5ML SUER Take 5 mLs by mouth at bedtime as needed for cough. 115 mL 0   No current facility-administered medications for this visit.    Allergies  Allergen Reactions  . Zyrtec [Cetirizine]     BP elevated       Objective:  BP 112/74 (BP Location: Left Arm, Cuff Size: Normal)   Pulse 66   Temp 97.8 F (36.6 C) (Oral)   Resp 16   Ht 5\' 9"  (1.753 m)   Wt 177 lb 9.6 oz (80.6 kg)   SpO2 98%   BMI 26.23 kg/m  Gen:  alert, not ill-appearing, no distress, appropriate for age HEENT: head normocephalic without obvious abnormality, conjunctiva and cornea clear, TM's clear bilaterally, nasal mucosa edematous, septum deviated to the left, rhinorrhea present, oropharynx clear, tonsils absent, neck supple,  no adenopathy, trachea midline Pulm: Normal work of breathing, normal phonation, clear to auscultation bilaterally, no wheezes, rales or rhonchi CV: Normal rate, regular rhythm, s1 and s2 distinct, no murmurs, clicks or rubs  Neuro: alert and oriented x 3, no tremor MSK: extremities atraumatic, normal gait and station Skin: intact, no rashes on exposed skin, no jaundice, no cyanosis Psych: well-groomed, cooperative, good eye contact, euthymic mood, affect mood-congruent, speech is articulate, and thought processes clear and goal-directed  Depression screen [facility] 2/9 09/19/2013  Decreased Interest 0  Down, Depressed, Hopeless 0  PHQ - 2 Score 0     No results found for this or any previous visit (from the past 72 hour(s)). No results found.    Assessment and  Plan: 41 y.o. male with   1. Persistent cough for 3 weeks or longer - differential includes upper airway cough syndrome given symptoms preceded by viral illness and there are nasal secretions. Recommend continuing antihistamine and nasal spray. Covering for CAP/pertussis with Macrolide due to duration of symptoms. Chest x-ray today to assess for infilrate. Symptomatic management with cough suppressants - chlorpheniramine-HYDROcodone (TUSSIONEX) 10-8 MG/5ML SUER; Take 5 mLs by mouth at bedtime as needed for cough.  Dispense: 115 mL; Refill: 0 - benzonatate (TESSALON) 200 MG capsule; Take 1 capsule (200 mg total) by mouth 3 (three) times daily as needed for cough.  Dispense: 45 capsule; Refill: 0 - azithromycin (ZITHROMAX Z-PAK) 250 MG tablet; Take 2 tablets (500 mg) on  Day 1,  followed by 1 tablet (250 mg) once daily on Days 2 through 5.  Dispense: 6 tablet; Refill: 0 - DG Chest 2 View  2. Rhinitis - continue Flonase and Claritin nightly   Patient education and anticipatory guidance given Patient agrees with treatment plan Follow-up as needed if symptoms worsen or fail to improve  Darlyne Russian PA-C

## 2017-08-24 ENCOUNTER — Encounter: Payer: Self-pay | Admitting: Physician Assistant

## 2017-08-24 DIAGNOSIS — J189 Pneumonia, unspecified organism: Secondary | ICD-10-CM | POA: Insufficient documentation

## 2017-08-24 DIAGNOSIS — J181 Lobar pneumonia, unspecified organism: Secondary | ICD-10-CM

## 2017-08-24 HISTORY — DX: Pneumonia, unspecified organism: J18.9

## 2017-08-24 NOTE — Progress Notes (Signed)
Good morning Robert Young,  Your chest x-ray did show pneumonia in your left lower lung. You received the treatment for this (Azithromycin). Please follow-up with Dr. Sheppard Coil in 4 weeks for a repeat chest x-ray or sooner if you are not feeling better.  Best, Evlyn Clines

## 2017-08-24 NOTE — Addendum Note (Signed)
Addended by: Nelson Chimes E on: 08/24/2017 09:04 AM   Modules accepted: Orders

## 2017-09-06 ENCOUNTER — Encounter: Payer: Self-pay | Admitting: Physician Assistant

## 2017-09-07 ENCOUNTER — Ambulatory Visit (INDEPENDENT_AMBULATORY_CARE_PROVIDER_SITE_OTHER): Payer: 59 | Admitting: Physician Assistant

## 2017-09-07 ENCOUNTER — Encounter: Payer: Self-pay | Admitting: Physician Assistant

## 2017-09-07 VITALS — BP 137/87 | HR 60 | Temp 97.9°F | Wt 180.0 lb

## 2017-09-07 DIAGNOSIS — J189 Pneumonia, unspecified organism: Secondary | ICD-10-CM

## 2017-09-07 DIAGNOSIS — J181 Lobar pneumonia, unspecified organism: Secondary | ICD-10-CM

## 2017-09-07 DIAGNOSIS — H938X1 Other specified disorders of right ear: Secondary | ICD-10-CM

## 2017-09-07 NOTE — Patient Instructions (Signed)
Eustachian Tube Dysfunction The eustachian tube connects the middle ear to the back of the nose. It regulates air pressure in the middle ear by allowing air to move between the ear and nose. It also helps to drain fluid from the middle ear space. When the eustachian tube does not function properly, air pressure, fluid, or both can build up in the middle ear. Eustachian tube dysfunction can affect one or both ears. What are the causes? This condition happens when the eustachian tube becomes blocked or cannot open normally. This may result from:  Ear infections.  Colds and other upper respiratory infections.  Allergies.  Irritation, such as from cigarette smoke or acid from the stomach coming up into the esophagus (gastroesophageal reflux).  Sudden changes in air pressure, such as from descending in an airplane.  Abnormal growths in the nose or throat, such as nasal polyps, tumors, or enlarged tissue at the back of the throat (adenoids).  What increases the risk? This condition may be more likely to develop in people who smoke and people who are overweight. Eustachian tube dysfunction may also be more likely to develop in children, especially children who have:  Certain birth defects of the mouth, such as cleft palate.  Large tonsils and adenoids.  What are the signs or symptoms? Symptoms of this condition may include:  A feeling of fullness in the ear.  Ear pain.  Clicking or popping noises in the ear.  Ringing in the ear.  Hearing loss.  Loss of balance.  Symptoms may get worse when the air pressure around you changes, such as when you travel to an area of high elevation or fly on an airplane. How is this diagnosed? This condition may be diagnosed based on:  Your symptoms.  A physical exam of your ear, nose, and throat.  Tests, such as those that measure: ? The movement of your eardrum (tympanogram). ? Your hearing (audiometry).  How is this treated? Treatment  depends on the cause and severity of your condition. If your symptoms are mild, you may be able to relieve your symptoms by moving air into ("popping") your ears. If you have symptoms of fluid in your ears, treatment may include:  Decongestants.  Antihistamines.  Nasal sprays or ear drops that contain medicines that reduce swelling (steroids).  In some cases, you may need to have a procedure to drain the fluid in your eardrum (myringotomy). In this procedure, a small tube is placed in the eardrum to:  Drain the fluid.  Restore the air in the middle ear space.  Follow these instructions at home:  Take over-the-counter and prescription medicines only as told by your health care provider.  Use techniques to help pop your ears as recommended by your health care provider. These may include: ? Chewing gum. ? Yawning. ? Frequent, forceful swallowing. ? Closing your mouth, holding your nose closed, and gently blowing as if you are trying to blow air out of your nose.  Do not do any of the following until your health care provider approves: ? Travel to high altitudes. ? Fly in airplanes. ? Work in a pressurized cabin or room. ? Scuba dive.  Keep your ears dry. Dry your ears completely after showering or bathing.  Do not smoke.  Keep all follow-up visits as told by your health care provider. This is important. Contact a health care provider if:  Your symptoms do not go away after treatment.  Your symptoms come back after treatment.  You are   unable to pop your ears.  You have: ? A fever. ? Pain in your ear. ? Pain in your head or neck. ? Fluid draining from your ear.  Your hearing suddenly changes.  You become very dizzy.  You lose your balance. This information is not intended to replace advice given to you by your health care provider. Make sure you discuss any questions you have with your health care provider. Document Released: 09/12/2015 Document Revised: 01/22/2016  Document Reviewed: 09/04/2014 Elsevier Interactive Patient Education  2018 [employer] Inc[employer]  

## 2017-09-07 NOTE — Progress Notes (Signed)
HPI:                                                                Robert Young is a 42 y.o. male who presents to [facility]: Robert Young today for pneumonia follow-up  Pleasant 42 yo M recently diagnosed with CAP of the left lower lobe on 08/19/17 presents for follow-up. Completed Azithromcyin. Reports he is feeling much better. Endorses occasional non-productive cough and right ear fullness. Denies fever, chills, malaise, hemoptysis, dyspnea.  Past Medical History:  Diagnosis Date  . Allergy   . Idiopathic thrombocythemia ([place])   . Left lower lobe pneumonia ([place]) 08/24/2017   Past Surgical History:  Procedure Laterality Date  . TONSILLECTOMY    . TONSILLECTOMY AND ADENOIDECTOMY     Social History   Tobacco Use  . Smoking status: Never Smoker  . Smokeless tobacco: Former Systems developer    Types: Chew  . Tobacco comment: NEVER USED TOBACCO  Substance Use Topics  . Alcohol use: Yes    Alcohol/week: 0.0 oz   family history includes Cancer in his paternal grandmother; Heart disease in his father and paternal uncle; Hyperlipidemia in his father; Hypertension in his father; Stroke in his father.  ROS: negative except as noted in the HPI  Medications: Current Outpatient Medications  Medication Sig Dispense Refill  . fluticasone (FLONASE) 50 MCG/ACT nasal spray Place into both nostrils daily.    [name] loratadine (CLARITIN) 10 MG tablet Take 10 mg by mouth daily.     No current facility-administered medications for this visit.    Allergies  Allergen Reactions  . Zyrtec [Cetirizine]     BP elevated       Objective:  BP 137/87   Pulse 60   Temp 97.9 F (36.6 C) (Oral)   Wt 180 lb (81.6 kg)   SpO2 100%   BMI 26.58 kg/m  Gen:  alert, not ill-appearing, no distress, appropriate for age 23: head normocephalic without obvious abnormality, conjunctiva and cornea clear, wearing glasses, right TM clear, normal external ear canals bilaterally,  trachea midline Pulm: Normal work of breathing, normal phonation, clear to auscultation bilaterally, no wheezes, rales or rhonchi CV: Normal rate, regular rhythm, s1 and s2 distinct, no murmurs, clicks or rubs  Neuro: alert and oriented x 3, no tremor MSK: extremities atraumatic, normal gait and station Skin: intact, no rashes on exposed skin, no jaundice, no cyanosis   Chest x-ray 08/19/17  CLINICAL DATA:  Cough and congestion for 3 weeks  EXAM: CHEST  2 VIEW  COMPARISON:  None.  FINDINGS: There is left lower lobe airspace disease most concerning for pneumonia. There is no pleural effusion or pneumothorax. The heart and mediastinal contours are unremarkable.  The osseous structures are unremarkable.  IMPRESSION: Left lower lobe pneumonia. Followup PA and lateral chest X-ray is recommended in 3-4 weeks following trial of antibiotic therapy to ensure resolution and exclude underlying malignancy.   Electronically Signed   By: Kathreen Devoid   On: 08/19/2017 16:16   No results found for this or any previous visit (from the past 66 hour(s)). No results found.    Assessment and Plan: 41 y.o. male with   1. Pneumonia of left lower lobe due to infectious organism [facility]) -  clinically resolved - repeat CXR in 2 weeks to assess for resolution - DG Chest 2 View; Future  2. Sensation of fullness in ear, right - tympanogram error - this is likely a serous effusion / eustachian tube dysfunction in the setting of recent URI - active surveillance for signs of otitis media, including pain and hearing loss   Patient education and anticipatory guidance given Patient agrees with treatment plan Follow-up as needed if symptoms worsen or fail to improve  Darlyne Russian PA-C

## 2017-09-13 ENCOUNTER — Ambulatory Visit (INDEPENDENT_AMBULATORY_CARE_PROVIDER_SITE_OTHER): Payer: 59 | Admitting: Osteopathic Medicine

## 2017-09-13 ENCOUNTER — Encounter: Payer: Self-pay | Admitting: Hematology & Oncology

## 2017-09-13 ENCOUNTER — Ambulatory Visit: Payer: 59 | Admitting: Osteopathic Medicine

## 2017-09-13 ENCOUNTER — Encounter: Payer: Self-pay | Admitting: Osteopathic Medicine

## 2017-09-13 VITALS — BP 125/80 | HR 51 | Temp 97.5°F | Ht 69.0 in | Wt 178.0 lb

## 2017-09-13 DIAGNOSIS — D693 Immune thrombocytopenic purpura: Secondary | ICD-10-CM | POA: Diagnosis not present

## 2017-09-13 DIAGNOSIS — Z0000 Encounter for general adult medical examination without abnormal findings: Secondary | ICD-10-CM

## 2017-09-13 MED ORDER — FLUTICASONE PROPIONATE 50 MCG/ACT NA SUSP: 1.0000 | Freq: Every day | NASAL | 3 refills | Status: DC

## 2017-09-13 NOTE — Progress Notes (Signed)
HPI: Robert Young is a 42 y.o. male who  has a past medical history of Allergy, Idiopathic thrombocythemia ([place]), and Left lower lobe pneumonia ([place]) (08/24/2017).  he presents to [facility] today, 09/13/17,  for chief complaint of:  Chief Complaint  Patient presents with  . Establish Care - due for annual physical     Previous patient of Dr. Ileene Rubens. Recently seen in the office by [facility] for pneumonia, last visit 09/07/2017. Here today to officially reestablish care with PCP.  Requests annual physical, see below for review of preventive care.  Other medical issues reviewed today:  Following with oncology for chronic immune from cytopenia. Last seen 05/20/2017 by Dr. Marin Olp. Counts were doing okay at that time. Follow-up was advised later December 2018 for labs, looks like CBC was last done 07/01/17.    Past medical, surgical, social and family history reviewed:  Patient Active Problem List   Diagnosis Date Noted  . Left lower lobe pneumonia ([place]) 08/24/2017  . Persistent cough for 3 weeks or longer 08/19/2017  . Idiopathic thrombocytopenic purpura ([place]) 07/28/2015  . Allergic rhinitis 03/30/2010    Past Surgical History:  Procedure Laterality Date  . TONSILLECTOMY    . TONSILLECTOMY AND ADENOIDECTOMY      Social History   Tobacco Use  . Smoking status: Never Smoker  . Smokeless tobacco: Former Systems developer    Types: Chew  . Tobacco comment: NEVER USED TOBACCO  Substance Use Topics  . Alcohol use: Yes    Alcohol/week: 0.0 oz    Family History  Problem Relation Age of Onset  . Heart disease Father   . Hyperlipidemia Father   . Hypertension Father   . Stroke Father   . Heart disease Paternal Uncle   . Cancer Paternal Grandmother        breast     Current medication list and allergy/intolerance information reviewed:    Current Outpatient Medications  Medication Sig Dispense Refill  . fluticasone (FLONASE) 50 MCG/ACT nasal spray  Place into both nostrils daily.    [name] loratadine (CLARITIN) 10 MG tablet Take 10 mg by mouth daily.     No current facility-administered medications for this visit.     Allergies  Allergen Reactions  . Zyrtec [Cetirizine]     BP elevated      Review of Systems:  Constitutional:  No  fever, no chills, No recent illness, No unintentional weight changes. No significant fatigue.   HEENT: No  headache, no vision change, no hearing change, No sore throat, No  sinus pressure  Cardiac: No  chest pain, No  pressure, No palpitations, No  Orthopnea  Respiratory:  No  shortness of breath. No  Cough  Gastrointestinal: No  abdominal pain, No  nausea, No  vomiting,  No  blood in stool, No  diarrhea, No  constipation   Musculoskeletal: No new myalgia/arthralgia  Genitourinary: No  incontinence, No  abnormal genital bleeding, No abnormal genital discharge  Skin: No  Rash, No other wounds/concerning lesions  Hem/Onc: No  easy bruising/bleeding, No  abnormal lymph node  Endocrine: No cold intolerance,  No heat intolerance. No polyuria/polydipsia/polyphagia   Neurologic: No  weakness, No  dizziness, No  slurred speech/focal weakness/facial droop  Psychiatric: No  concerns with depression, No  concerns with anxiety, No sleep problems, No mood problems  Exam:  BP 137/79   Pulse (!) 51   Temp (!) 97.5 F (36.4 C) (Oral)   Ht 5\' 9"  (1.753  m)   Wt 178 lb 0.6 oz (80.8 kg)   BMI 26.29 kg/m   Constitutional: VS see above. General Appearance: alert, well-developed, well-nourished, NAD  Eyes: Normal lids and conjunctive, non-icteric sclera  Ears, Nose, Mouth, Throat: MMM, Normal external inspection ears/nares/mouth/lips/gums. TM normal bilaterally. Pharynx/tonsils no erythema, no exudate. Nasal mucosa normal.   Neck: No masses, trachea midline. No thyroid enlargement. No tenderness/mass appreciated. No lymphadenopathy  Respiratory: Normal respiratory effort. no wheeze, no rhonchi, no  rales  Cardiovascular: S1/S2 normal, no murmur, no rub/gallop auscultated. RRR. No lower extremity edema. Pedal pulse II/IV bilaterally DP and PT. No carotid bruit or JVD. No abdominal aortic bruit.  Gastrointestinal: Nontender, no masses. No hepatomegaly, no splenomegaly. No hernia appreciated. Bowel sounds normal. Rectal exam deferred.   Musculoskeletal: Gait normal. No clubbing/cyanosis of digits.   Neurological: Normal balance/coordination. No tremor. No cranial nerve deficit on limited exam. Motor and sensation intact and symmetric. Cerebellar reflexes intact.   Skin: warm, dry, intact. No rash/ulcer. No concerning nevi or subq nodules on limited exam.    Psychiatric: Normal judgment/insight. Normal mood and affect. Oriented x3.      ASSESSMENT/PLAN:   Annual physical exam - Plan: CBC, COMPLETE METABOLIC PANEL WITH GFR, Lipid panel, TSH  Idiopathic thrombocytopenic purpura ([place]) - Following with hematology, no concerns    MALE PREVENTIVE CARE  updated 09/13/17  ANNUAL SCREENING/COUNSELING  Any changes to health in the past year? no  Diet/Exercise - HEALTHY HABITS DISCUSSED TO DECREASE CV RISK Social History   Tobacco Use  Smoking Status Never Smoker  Smokeless Tobacco Former Systems developer  . Types: Chew  Tobacco Comment   NEVER USED TOBACCO   Social History   Substance and Sexual Activity  Alcohol Use Yes  . Alcohol/week: 0.0 oz   Comment: few drinks per week    Depression screen [facility] 2/9 09/13/2017  Decreased Interest 0  Down, Depressed, Hopeless 0  PHQ - 2 Score 0  Altered sleeping 0  Tired, decreased energy 0  Change in appetite 0  Feeling bad or failure about yourself  0  Trouble concentrating 0  Moving slowly or fidgety/restless 0  Suicidal thoughts 0  PHQ-9 Score 0    SEXUAL/REPRODUCTIVE HEALTH  Sexually active in the past year? - Yes with male.  STI testing needed/desired today? - no  Any concerns with testosterone/libido? - no  INFECTIOUS  DISEASE SCREENING  HIV - does not need  GC/CT - does not need  HepC - does not need  TB - does not need  CANCER SCREENING  Lung - USPSTF: 55-80yo w/ 30 py hx unless quit w/in 70yr - does not need  Colon - does not need  Prostate - does not need  OTHER DISEASE SCREENING  Lipid - needs  DM2 - does not need  AAA - 65-75yo ever smoked: does not need  Osteoporosis - men 40yo+ - does not need  ADULT VACCINATION  Influenza - annual vaccine recommended  Td - booster every 10 years   Zoster - Shingrix recommended 46+ years old  PCV13 - was not indicated  PPSV23 - was not indicated Immunization History  Administered Date(s) Administered  . Influenza Split 05/28/2013  . Influenza-Unspecified 11/04/2014, 06/04/2015, 06/15/2016  . Tdap 04/19/2008       Visit summary with medication list and pertinent instructions was printed for patient to review. All questions at time of visit were answered - patient instructed to contact office with any additional concerns. ER/RTC precautions were reviewed with the  patient.   Follow-up plan: Return in about 1 year (around 09/13/2018) for repeat annual physical .   Please note: voice recognition software was used to produce this document, and typos may escape review. Please contact Dr. Sheppard Coil for any needed clarifications.

## 2017-09-14 LAB — COMPLETE METABOLIC PANEL WITH GFR
AG Ratio: 2.9 (calc) — ABNORMAL HIGH (ref 1.0–2.5)
ALT: 15 U/L (ref 9–46)
AST: 20 U/L (ref 10–40)
Albumin: 4.9 g/dL (ref 3.6–5.1)
Alkaline phosphatase (APISO): 49 U/L (ref 40–115)
BUN: 15 mg/dL (ref 7–25)
CO2: 29 mmol/L (ref 20–32)
Calcium: 9.6 mg/dL (ref 8.6–10.3)
Chloride: 105 mmol/L (ref 98–110)
Creat: 1.25 mg/dL (ref 0.60–1.35)
GFR, Est African American: 84 mL/min/{1.73_m2} (ref >=60)
GFR, Est Non African American: 72 mL/min/{1.73_m2} (ref >=60)
Globulin: 1.7 g/dL (calc) — ABNORMAL LOW (ref 1.9–3.7)
Glucose, Bld: 99 mg/dL (ref 65–99)
Potassium: 4.2 mmol/L (ref 3.5–5.3)
Sodium: 141 mmol/L (ref 135–146)
Total Bilirubin: 1.1 mg/dL (ref 0.2–1.2)
Total Protein: 6.6 g/dL (ref 6.1–8.1)

## 2017-09-14 LAB — CBC
HCT: 42.2 % (ref 38.5–50.0)
Hemoglobin: 14.8 g/dL (ref 13.2–17.1)
MCH: 30.8 pg (ref 27.0–33.0)
MCHC: 35.1 g/dL (ref 32.0–36.0)
MCV: 87.9 fL (ref 80.0–100.0)
MPV: 11.6 fL (ref 7.5–12.5)
Platelets: 86 10*3/uL — ABNORMAL LOW (ref 140–400)
RBC: 4.8 10*6/uL (ref 4.20–5.80)
RDW: 12.1 % (ref 11.0–15.0)
WBC: 4 10*3/uL (ref 3.8–10.8)

## 2017-09-14 LAB — LIPID PANEL
Cholesterol: 163 mg/dL (ref <200)
HDL: 46 mg/dL (ref >40)
LDL Cholesterol (Calc): 96 mg/dL (calc)
Non-HDL Cholesterol (Calc): 117 mg/dL (calc) (ref <130)
Total CHOL/HDL Ratio: 3.5 (calc) (ref <5.0)
Triglycerides: 112 mg/dL (ref <150)

## 2017-09-14 LAB — TSH: TSH: 5.82 mIU/L — ABNORMAL HIGH (ref 0.40–4.50)

## 2017-09-15 ENCOUNTER — Ambulatory Visit: Payer: 59 | Admitting: Osteopathic Medicine

## 2017-09-16 ENCOUNTER — Inpatient Hospital Stay: Payer: 59 | Attending: Hematology & Oncology | Admitting: Hematology & Oncology

## 2017-09-16 ENCOUNTER — Encounter: Payer: Self-pay | Admitting: Hematology & Oncology

## 2017-09-16 ENCOUNTER — Other Ambulatory Visit: Payer: Self-pay

## 2017-09-16 ENCOUNTER — Other Ambulatory Visit: Payer: 59

## 2017-09-16 VITALS — BP 137/82 | HR 62 | Temp 98.2°F | Resp 16 | Wt 179.0 lb

## 2017-09-16 DIAGNOSIS — D693 Immune thrombocytopenic purpura: Secondary | ICD-10-CM | POA: Insufficient documentation

## 2017-09-16 NOTE — Progress Notes (Signed)
Hematology and Oncology Follow Up Visit  Robert Young [id number] 20-Dec-1975 42 y.o. 09/16/2017   Principle Diagnosis:  Chronic immune thrombocytopenia  - Relapsed  Current Therapy:  Decadron 40 mg po q day x 4 days every 2 weeks - as needed Nplate - dose adjusted per pharmacy - as indicated     Interim History:  Robert Young is here today for a follow-up.  He had no problems over the holidays.  He actually was up in [place] for a football game.  He took his young son.    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  He is quite busy.  He has had no problems with bleeding or bruising.  He has had no fatigue or weakness.  He is trying to exercise every day.  He has had no fever.  He said he did have a bout of pneumonia the end of December.  There is been no change in bowel or bladder habits.  His appetite is been quite good.  He has had no nausea or vomiting.  Currently, his performance status is ECOG 0.,   Medications:  Allergies as of 09/16/2017      Reactions   Zyrtec [cetirizine]    BP elevated      Medication List        Accurate as of 09/16/17  3:21 PM. Always use your most recent med list.          fluticasone 50 MCG/ACT nasal spray Commonly known as:  FLONASE Place 1-2 sprays into both nostrils daily.   loratadine 10 MG tablet Commonly known as:  CLARITIN Take 10 mg by mouth daily.       Allergies:  Allergies  Allergen Reactions  . Zyrtec [Cetirizine]     BP elevated    Past Medical History, Surgical history, Social history, and Family History were reviewed and updated.  Review of Systems: Review of Systems  Constitutional: Negative.   HENT: Negative.   Eyes: Negative.   Respiratory: Negative.   Cardiovascular: Negative.   Gastrointestinal: Negative.   Genitourinary: Negative.   Musculoskeletal: Negative.   Skin: Negative.   Neurological: Negative.   Endo/Heme/Allergies: Negative.   Psychiatric/Behavioral:  Negative.      Physical Exam:  weight is 179 lb (81.2 kg). His oral temperature is 98.2 F (36.8 C). His blood pressure is 137/82 and his pulse is 62. His respiration is 16 and oxygen saturation is 100%.   Wt Readings from Last 3 Encounters:  09/16/17 179 lb (81.2 kg)  09/13/17 178 lb 0.6 oz (80.8 kg)  09/07/17 180 lb (81.6 kg)    Physical Exam  Constitutional: He is oriented to person, place, and time.  HENT:  Head: Normocephalic and atraumatic.  Mouth/Throat: Oropharynx is clear and moist.  Eyes: EOM are normal. Pupils are equal, round, and reactive to light.  Neck: Normal range of motion.  Cardiovascular: Normal rate, regular rhythm and normal heart sounds.  Pulmonary/Chest: Effort normal and breath sounds normal.  Abdominal: Soft. Bowel sounds are normal.  Musculoskeletal: Normal range of motion. He exhibits no edema, tenderness or deformity.  Lymphadenopathy:    He has no cervical adenopathy.  Neurological: He is alert and oriented to person, place, and time.  Skin: Skin is warm and dry. No rash noted. No erythema.  Psychiatric: He has a normal mood and affect. His behavior is normal. Judgment and thought content normal.  Vitals reviewed.   Lab Results  Component Value Date  WBC 4.0 09/13/2017   HGB 14.8 09/13/2017   HCT 42.2 09/13/2017   MCV 87.9 09/13/2017   PLT 86 (L) 09/13/2017   No results found for: FERRITIN, IRON, TIBC, UIBC, IRONPCTSAT Lab Results  Component Value Date   RBC 4.80 09/13/2017   No results found for: KPAFRELGTCHN, LAMBDASER, KAPLAMBRATIO No results found for: IGGSERUM, IGA, IGMSERUM No results found for: Odetta Pink, SPEI   Chemistry      Component Value Date/Time   NA 141 09/13/2017 0757   NA 142 07/01/2017 1246   K 4.2 09/13/2017 0757   K 4.1 07/01/2017 1246   CL 105 09/13/2017 0757   CL 94 (L) 09/06/2016 1502   CO2 29 09/13/2017 0757   CO2 30 (H) 07/01/2017 1246   BUN 15  09/13/2017 0757   BUN 12.4 07/01/2017 1246   CREATININE 1.25 09/13/2017 0757   CREATININE 1.1 07/01/2017 1246      Component Value Date/Time   CALCIUM 9.6 09/13/2017 0757   CALCIUM 9.1 07/01/2017 1246   ALKPHOS 61 07/01/2017 1246   AST 20 09/13/2017 0757   AST 19 07/01/2017 1246   ALT 15 09/13/2017 0757   ALT 13 07/01/2017 1246   BILITOT 1.1 09/13/2017 0757   BILITOT 0.66 07/01/2017 1246     Impression and Plan: Robert Young is a 42 year old white male. white male. He has relapsed immune thrombocytopenia. His platelet count actually is doing quite well for him.  Everything looks great.  I was at his blood under the microscope.  I did not see anything that looked suspicious.  We will get him back in 3 months.  I do not think we need any lab work in between visits.  He certainly knows that he can always come in for lab work if he starts to have any problems with bleeding or bruising.  Volanda Napoleon, MD 1/18/20193:21 PM

## 2017-09-26 MED FILL — FLUTICASONE PROP 50 MCG SPR: 50 | 90 days supply | Qty: 48 | Fill #0

## 2017-10-04 ENCOUNTER — Ambulatory Visit (HOSPITAL_COMMUNITY)
Admission: RE | Admit: 2017-10-04 | Discharge: 2017-10-04 | Disposition: A | Discharge status: Home / Self Care | Payer: 59 | Source: Ambulatory Visit | Attending: Physician Assistant | Admitting: Physician Assistant

## 2017-10-04 DIAGNOSIS — J181 Lobar pneumonia, unspecified organism: Secondary | ICD-10-CM | POA: Diagnosis not present

## 2017-10-04 DIAGNOSIS — R05 Cough: Secondary | ICD-10-CM | POA: Diagnosis not present

## 2017-10-04 DIAGNOSIS — J189 Pneumonia, unspecified organism: Secondary | ICD-10-CM

## 2017-10-04 NOTE — Progress Notes (Signed)
Hi Bion,  Your chest x-ray was normal. No evidence of pneumonia.  Best, Evlyn Clines

## 2017-12-13 ENCOUNTER — Encounter: Payer: Self-pay | Admitting: Hematology & Oncology

## 2017-12-13 ENCOUNTER — Other Ambulatory Visit: Payer: Self-pay | Admitting: *Deleted

## 2017-12-13 DIAGNOSIS — D693 Immune thrombocytopenic purpura: Secondary | ICD-10-CM

## 2017-12-16 ENCOUNTER — Encounter: Payer: Self-pay | Admitting: Hematology & Oncology

## 2017-12-16 ENCOUNTER — Inpatient Hospital Stay: Payer: 59 | Attending: Hematology & Oncology | Admitting: Hematology & Oncology

## 2017-12-16 ENCOUNTER — Inpatient Hospital Stay: Payer: 59

## 2017-12-16 ENCOUNTER — Other Ambulatory Visit: Payer: Self-pay

## 2017-12-16 VITALS — BP 130/77 | HR 63 | Temp 98.0°F | Resp 18 | Wt 175.0 lb

## 2017-12-16 DIAGNOSIS — D693 Immune thrombocytopenic purpura: Secondary | ICD-10-CM | POA: Diagnosis not present

## 2017-12-16 DIAGNOSIS — Z79899 Other long term (current) drug therapy: Secondary | ICD-10-CM | POA: Diagnosis not present

## 2017-12-16 LAB — CBC WITH DIFFERENTIAL (CANCER CENTER ONLY)
Basophils Absolute: 0 10*3/uL (ref 0.0–0.1)
Basophils Relative: 0 %
EOS PCT: 3 %
Eosinophils Absolute: 0.2 10*3/uL (ref 0.0–0.5)
HCT: 43.9 % (ref 38.7–49.9)
Hemoglobin: 15.4 g/dL (ref 13.0–17.1)
LYMPHS ABS: 2.1 10*3/uL (ref 0.9–3.3)
LYMPHS PCT: 30 %
MCH: 30.7 pg (ref 28.0–33.4)
MCHC: 35.1 g/dL (ref 32.0–35.9)
MCV: 87.5 fL (ref 82.0–98.0)
MONOS PCT: 8 %
Monocytes Absolute: 0.6 10*3/uL (ref 0.1–0.9)
Neutro Abs: 4.1 10*3/uL (ref 1.5–6.5)
Neutrophils Relative %: 59 %
PLATELETS: 128 10*3/uL — AB (ref 145–400)
RBC: 5.02 MIL/uL (ref 4.20–5.70)
RDW: 12.3 % (ref 11.1–15.7)
WBC: 6.9 10*3/uL (ref 4.0–10.0)

## 2017-12-16 NOTE — Progress Notes (Signed)
Hematology and Oncology Follow Up Visit  Robert Young [id number] June 16, 1976 42 y.o. 12/16/2017   Principle Diagnosis:  Chronic immune thrombocytopenia  - Relapsed  Current Therapy:  Decadron 40 mg po q day x 4 days every 2 weeks - as needed Nplate - dose adjusted per pharmacy - as indicated     Interim History:  Robert Young is here today for a follow-up.  He is doing quite well.  He has had no problems with bleeding or bruising.  He did have an episode of pneumonia in January.  Thankfully, his platelets did not drop.  He is still working full-time over at radiation oncology.  He has had no problems with fever.  He has had no change in bowel or bladder habits.  He has had no nausea or vomiting.  He has had no leg swelling.  There is been no rashes.  Overall, his performance status is ECOG 0.  Medications:  Allergies as of 12/16/2017      Reactions   Zyrtec [cetirizine]    BP elevated      Medication List        Accurate as of 12/16/17  4:02 PM. Always use your most recent med list.          fluticasone 50 MCG/ACT nasal spray Commonly known as:  FLONASE Place 1-2 sprays into both nostrils daily.   loratadine 10 MG tablet Commonly known as:  CLARITIN Take 10 mg by mouth daily.       Allergies:  Allergies  Allergen Reactions  . Zyrtec [Cetirizine]     BP elevated    Past Medical History, Surgical history, Social history, and Family History were reviewed and updated.  Review of Systems: Review of Systems  Constitutional: Negative.   HENT: Negative.   Eyes: Negative.   Respiratory: Negative.   Cardiovascular: Negative.   Gastrointestinal: Negative.   Genitourinary: Negative.   Musculoskeletal: Negative.   Skin: Negative.   Neurological: Negative.   Endo/Heme/Allergies: Negative.   Psychiatric/Behavioral: Negative.      Physical Exam:  weight is 175 lb (79.4 kg). His oral temperature is 98 F (36.7 C). His blood pressure is 130/77 and his pulse is  63. His respiration is 18 and oxygen saturation is 100%.   Wt Readings from Last 3 Encounters:  12/16/17 175 lb (79.4 kg)  09/16/17 179 lb (81.2 kg)  09/13/17 178 lb 0.6 oz (80.8 kg)    Physical Exam  Constitutional: He is oriented to person, place, and time.  HENT:  Head: Normocephalic and atraumatic.  Mouth/Throat: Oropharynx is clear and moist.  Eyes: Pupils are equal, round, and reactive to light. EOM are normal.  Neck: Normal range of motion.  Cardiovascular: Normal rate, regular rhythm and normal heart sounds.  Pulmonary/Chest: Effort normal and breath sounds normal.  Abdominal: Soft. Bowel sounds are normal.  Musculoskeletal: Normal range of motion. He exhibits no edema, tenderness or deformity.  Lymphadenopathy:    He has no cervical adenopathy.  Neurological: He is alert and oriented to person, place, and time.  Skin: Skin is warm and dry. No rash noted. No erythema.  Psychiatric: He has a normal mood and affect. His behavior is normal. Judgment and thought content normal.  Vitals reviewed.   Lab Results  Component Value Date   WBC 6.9 12/16/2017   HGB 14.8 09/13/2017   HCT 43.9 12/16/2017   MCV 87.5 12/16/2017   PLT 128 (L) 12/16/2017   No results found for: FERRITIN, IRON, TIBC,  UIBC, IRONPCTSAT Lab Results  Component Value Date   RBC 5.02 12/16/2017   No results found for: KPAFRELGTCHN, LAMBDASER, KAPLAMBRATIO No results found for: IGGSERUM, IGA, IGMSERUM No results found for: Odetta Pink, SPEI   Chemistry      Component Value Date/Time   NA 141 09/13/2017 0757   NA 142 07/01/2017 1246   K 4.2 09/13/2017 0757   K 4.1 07/01/2017 1246   CL 105 09/13/2017 0757   CL 94 (L) 09/06/2016 1502   CO2 29 09/13/2017 0757   CO2 30 (H) 07/01/2017 1246   BUN 15 09/13/2017 0757   BUN 12.4 07/01/2017 1246   CREATININE 1.25 09/13/2017 0757   CREATININE 1.1 07/01/2017 1246      Component Value Date/Time    CALCIUM 9.6 09/13/2017 0757   CALCIUM 9.1 07/01/2017 1246   ALKPHOS 61 07/01/2017 1246   AST 20 09/13/2017 0757   AST 19 07/01/2017 1246   ALT 15 09/13/2017 0757   ALT 13 07/01/2017 1246   BILITOT 1.1 09/13/2017 0757   BILITOT 0.66 07/01/2017 1246     Impression and Plan: Robert Young is a 42 year old white male. He has relapsed immune thrombocytopenia. His platelet count actually is doing quite well for him.  Everything looks great.  I looked at his blood under the microscope.  I did not see anything that looked suspicious.  We will get him back in 6 months.  I do not think we need any lab work in between visits.  He certainly knows that he can always come in for lab work if he starts to have any problems with bleeding or bruising.  Volanda Napoleon, MD 4/19/20194:02 PM

## 2017-12-19 ENCOUNTER — Encounter: Payer: Self-pay | Admitting: *Deleted

## 2017-12-19 LAB — PLATELET BY CITRATE

## 2017-12-19 LAB — TSH: TSH: 4.365 u[IU]/mL — ABNORMAL HIGH (ref 0.320–4.118)

## 2018-06-16 ENCOUNTER — Ambulatory Visit: Payer: 59 | Admitting: Hematology

## 2018-06-16 ENCOUNTER — Other Ambulatory Visit: Payer: 59

## 2018-06-29 ENCOUNTER — Inpatient Hospital Stay: Payer: 59 | Attending: Hematology

## 2018-06-29 ENCOUNTER — Inpatient Hospital Stay (HOSPITAL_BASED_OUTPATIENT_CLINIC_OR_DEPARTMENT_OTHER): Payer: 59 | Admitting: Hematology

## 2018-06-29 ENCOUNTER — Encounter: Payer: Self-pay | Admitting: Hematology

## 2018-06-29 VITALS — BP 142/84 | HR 61 | Temp 98.2°F | Resp 17 | Ht 69.0 in | Wt 180.8 lb

## 2018-06-29 DIAGNOSIS — D693 Immune thrombocytopenic purpura: Secondary | ICD-10-CM | POA: Insufficient documentation

## 2018-06-29 LAB — PLATELET BY CITRATE

## 2018-06-29 LAB — CBC WITH DIFFERENTIAL (CANCER CENTER ONLY)
Abs Immature Granulocytes: 0.01 10*3/uL (ref 0.00–0.07)
Basophils Absolute: 0 10*3/uL (ref 0.0–0.1)
Basophils Relative: 0 %
EOS PCT: 7 %
Eosinophils Absolute: 0.4 10*3/uL (ref 0.0–0.5)
HEMATOCRIT: 42.5 % (ref 39.0–52.0)
Hemoglobin: 14.2 g/dL (ref 13.0–17.0)
IMMATURE GRANULOCYTES: 0 %
LYMPHS ABS: 1.5 10*3/uL (ref 0.7–4.0)
Lymphocytes Relative: 28 %
MCH: 29.8 pg (ref 26.0–34.0)
MCHC: 33.4 g/dL (ref 30.0–36.0)
MCV: 89.1 fL (ref 80.0–100.0)
MONOS PCT: 9 %
Monocytes Absolute: 0.5 10*3/uL (ref 0.1–1.0)
Neutro Abs: 2.9 10*3/uL (ref 1.7–7.7)
Neutrophils Relative %: 56 %
Platelet Count: 122 10*3/uL — ABNORMAL LOW (ref 150–400)
RBC: 4.77 MIL/uL (ref 4.22–5.81)
RDW: 11.5 % (ref 11.5–15.5)
WBC Count: 5.3 10*3/uL (ref 4.0–10.5)
nRBC: 0 % (ref 0.0–0.2)

## 2018-06-29 NOTE — Progress Notes (Signed)
[place] OFFICE PROGRESS NOTE  Patient Care Team: Emeterio Reeve, DO as PCP - General (Osteopathic Medicine)  Principle Diagnosis:  Chronic immune thrombocytopenia  - Relapsed  Current Therapy:  Decadron 40 mg po q day x 4 days every 2 weeks - as needed Nplate - dose adjusted per pharmacy - as indicated  ASSESSMENT & PLAN:  Chronic ITP -Plts 122k today, stable  -Patient denies any symptoms of abnormal bleeding or bruising; his last treatment for ITP was in January 2018 -I reviewed the peripheral blood smear, which showed normal WBC, RBC, and platelet morphology.  There was no dysplastic change.  There was no platelet clumping. -In the absence of worsening thrombocytopenia or clinically significant symptoms related to thrombocytopenia, there is no indication for treatment at this time -I counseled the patient regarding worrisome symptoms, such as unexplained fever, weight loss, night sweats, abnormal bleeding or bruising, in which case he should seek care promptly   Routine health maintenance -The patient is up-to-date with vaccinations, including influenza vaccine   Orders Placed This Encounter  Procedures  . CBC with Differential (Cancer Center Only)    Standing Status:   Future    Standing Expiration Date:   08/03/2019  . Lactate dehydrogenase    Standing Status:   Future    Standing Expiration Date:   08/03/2019  . Save Smear (SSMR)    Standing Status:   Future    Standing Expiration Date:   06/30/2019  . Platelet by Citrate    Standing Status:   Future    Standing Expiration Date:   06/29/2019   All questions were answered. The patient knows to call the clinic with any problems, questions or concerns. No barriers to learning was detected.  A total of more than 15 minutes were spent face-to-face with the patient during this encounter and over half of that time was spent on counseling and coordination of care as outlined above.   Return in 6 months for  labs and follow-up with Dr. Marin Olp.   Tish Men, MD 06/29/2018 3:48 PM  CHIEF COMPLAINT: "I am here for blood check"  INTERVAL HISTORY: Mr. Clubb returns to clinic for follow-up of chronic ITP.  He he reports doing well and denies any abnormal bleeding or bruising.  He denies any recent infection, fever, chill, weight loss, night sweats, or lymphadenopathy.  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  REVIEW OF SYSTEMS:   Constitutional: ( - ) fevers, ( - )  chills , ( - ) night sweats Eyes: ( - ) blurriness of vision, ( - ) double vision, ( - ) watery eyes Ears, nose, mouth, throat, and face: ( - ) mucositis, ( - ) sore throat Respiratory: ( - ) cough, ( - ) dyspnea, ( - ) wheezes Cardiovascular: ( - ) palpitation, ( - ) chest discomfort, ( - ) lower extremity swelling Gastrointestinal:  ( - ) nausea, ( - ) heartburn, ( - ) change in bowel habits Skin: ( - ) abnormal skin rashes Lymphatics: ( - ) new lymphadenopathy, ( - ) easy bruising Neurological: ( - ) numbness, ( - ) tingling, ( - ) new weaknesses Behavioral/Psych: ( - ) mood change, ( - ) new changes  All other systems were reviewed with the patient and are negative.  I have reviewed the past medical history, past surgical history, social history and family history with the patient and they are unchanged from  previous note.  ALLERGIES:  is allergic to zyrtec [cetirizine].  MEDICATIONS:  Current Outpatient Medications  Medication Sig Dispense Refill  . fluticasone (FLONASE) 50 MCG/ACT nasal spray Place 1-2 sprays into both nostrils daily. 48 g 3  . loratadine (CLARITIN) 10 MG tablet Take 10 mg by mouth daily.     No current facility-administered medications for this visit.     PHYSICAL EXAMINATION: ECOG PERFORMANCE STATUS: 0 - Asymptomatic  Today's Vitals   06/29/18 1531 06/29/18 1537  BP: (!) 142/84   Pulse: 61   Resp: 17   Temp: 98.2 F (36.8 C)   TempSrc: Oral    SpO2: 100%   Weight: 180 lb 12 oz (82 kg)   Height: 5\' 9"  (1.753 m)   PainSc:  0-No pain   Body mass index is 26.69 kg/m.  Filed Weights   06/29/18 1531  Weight: 180 lb 12 oz (82 kg)    GENERAL: alert, no distress and comfortable SKIN: skin color, texture, turgor are normal, no rashes or significant lesions EYES: conjunctiva are pink and non-injected, sclera clear OROPHARYNX: no exudate, no erythema; lips, buccal mucosa, and tongue normal  NECK: supple, non-tender LYMPH:  no palpable lymphadenopathy in the cervical or axillary  LUNGS: clear to auscultation and percussion with normal breathing effort HEART: regular rate & rhythm and no murmurs and no lower extremity edema ABDOMEN: soft, non-tender, non-distended, normal bowel sounds Musculoskeletal: no cyanosis of digits and no clubbing  PSYCH: alert & oriented x 3, fluent speech NEURO: no focal motor/sensory deficits  LABORATORY DATA:  I have reviewed the data as listed    Component Value Date/Time   NA 141 09/13/2017 0757   NA 142 07/01/2017 1246   K 4.2 09/13/2017 0757   K 4.1 07/01/2017 1246   CL 105 09/13/2017 0757   CL 94 (L) 09/06/2016 1502   CO2 29 09/13/2017 0757   CO2 30 (H) 07/01/2017 1246   GLUCOSE 99 09/13/2017 0757   GLUCOSE 82 07/01/2017 1246   BUN 15 09/13/2017 0757   BUN 12.4 07/01/2017 1246   CREATININE 1.25 09/13/2017 0757   CREATININE 1.1 07/01/2017 1246   CALCIUM 9.6 09/13/2017 0757   CALCIUM 9.1 07/01/2017 1246   PROT 6.6 09/13/2017 0757   PROT 6.8 07/01/2017 1246   ALBUMIN 4.2 07/01/2017 1246   AST 20 09/13/2017 0757   AST 19 07/01/2017 1246   ALT 15 09/13/2017 0757   ALT 13 07/01/2017 1246   ALKPHOS 61 07/01/2017 1246   BILITOT 1.1 09/13/2017 0757   BILITOT 0.66 07/01/2017 1246   GFRNONAA 72 09/13/2017 0757   GFRAA 84 09/13/2017 0757    No results found for: SPEP, UPEP  Lab Results  Component Value Date   WBC 5.3 06/29/2018   NEUTROABS 2.9 06/29/2018   HGB 14.2 06/29/2018    HCT 42.5 06/29/2018   MCV 89.1 06/29/2018   PLT 122 (L) 06/29/2018      Chemistry      Component Value Date/Time   NA 141 09/13/2017 0757   NA 142 07/01/2017 1246   K 4.2 09/13/2017 0757   K 4.1 07/01/2017 1246   CL 105 09/13/2017 0757   CL 94 (L) 09/06/2016 1502   CO2 29 09/13/2017 0757   CO2 30 (H) 07/01/2017 1246   BUN 15 09/13/2017 0757   BUN 12.4 07/01/2017 1246   CREATININE 1.25 09/13/2017 0757   CREATININE 1.1 07/01/2017 1246      Component Value Date/Time   CALCIUM 9.6 09/13/2017  0757   CALCIUM 9.1 07/01/2017 1246   ALKPHOS 61 07/01/2017 1246   AST 20 09/13/2017 0757   AST 19 07/01/2017 1246   ALT 15 09/13/2017 0757   ALT 13 07/01/2017 1246   BILITOT 1.1 09/13/2017 0757   BILITOT 0.66 07/01/2017 1246

## 2018-08-09 DIAGNOSIS — H524 Presbyopia: Secondary | ICD-10-CM | POA: Diagnosis not present

## 2018-09-08 ENCOUNTER — Encounter: Payer: Self-pay | Admitting: Osteopathic Medicine

## 2018-09-21 ENCOUNTER — Ambulatory Visit (INDEPENDENT_AMBULATORY_CARE_PROVIDER_SITE_OTHER): Payer: 59 | Admitting: Osteopathic Medicine

## 2018-09-21 ENCOUNTER — Encounter: Payer: Self-pay | Admitting: Osteopathic Medicine

## 2018-09-21 VITALS — BP 135/75 | HR 45 | Wt 178.4 lb

## 2018-09-21 DIAGNOSIS — Z0000 Encounter for general adult medical examination without abnormal findings: Secondary | ICD-10-CM | POA: Diagnosis not present

## 2018-09-21 DIAGNOSIS — Z23 Encounter for immunization: Secondary | ICD-10-CM | POA: Diagnosis not present

## 2018-09-21 LAB — LIPID PANEL
CHOL/HDL RATIO: 3.5 (calc) (ref <5.0)
CHOLESTEROL: 159 mg/dL (ref <200)
HDL: 45 mg/dL (ref >40)
LDL Cholesterol (Calc): 97 mg/dL (calc)
NON-HDL CHOLESTEROL (CALC): 114 mg/dL (ref <130)
Triglycerides: 82 mg/dL (ref <150)

## 2018-09-21 LAB — CBC WITH DIFFERENTIAL/PLATELET
Absolute Monocytes: 378 cells/uL (ref 200–950)
BASOS ABS: 12 {cells}/uL (ref 0–200)
Basophils Relative: 0.3 %
EOS PCT: 2.8 %
Eosinophils Absolute: 109 cells/uL (ref 15–500)
HCT: 42.2 % (ref 38.5–50.0)
Hemoglobin: 14.9 g/dL (ref 13.2–17.1)
LYMPHS ABS: 1088 {cells}/uL (ref 850–3900)
MCH: 31.5 pg (ref 27.0–33.0)
MCHC: 35.3 g/dL (ref 32.0–36.0)
MCV: 89.2 fL (ref 80.0–100.0)
MONOS PCT: 9.7 %
MPV: 11.4 fL (ref 7.5–12.5)
NEUTROS PCT: 59.3 %
Neutro Abs: 2313 cells/uL (ref 1500–7800)
Platelets: 128 10*3/uL — ABNORMAL LOW (ref 140–400)
RBC: 4.73 10*6/uL (ref 4.20–5.80)
RDW: 12 % (ref 11.0–15.0)
TOTAL LYMPHOCYTE: 27.9 %
WBC: 3.9 10*3/uL (ref 3.8–10.8)

## 2018-09-21 LAB — COMPLETE METABOLIC PANEL WITH GFR
AG Ratio: 2.8 (calc) — ABNORMAL HIGH (ref 1.0–2.5)
ALKALINE PHOSPHATASE (APISO): 43 U/L (ref 40–115)
ALT: 13 U/L (ref 9–46)
AST: 20 U/L (ref 10–40)
Albumin: 4.8 g/dL (ref 3.6–5.1)
BUN: 18 mg/dL (ref 7–25)
CALCIUM: 9.4 mg/dL (ref 8.6–10.3)
CO2: 31 mmol/L (ref 20–32)
CREATININE: 1.22 mg/dL (ref 0.60–1.35)
Chloride: 103 mmol/L (ref 98–110)
GFR, EST NON AFRICAN AMERICAN: 74 mL/min/{1.73_m2} (ref >=60)
GFR, Est African American: 85 mL/min/{1.73_m2} (ref >=60)
GLOBULIN: 1.7 g/dL — AB (ref 1.9–3.7)
GLUCOSE: 93 mg/dL (ref 65–99)
POTASSIUM: 4.2 mmol/L (ref 3.5–5.3)
SODIUM: 141 mmol/L (ref 135–146)
Total Bilirubin: 1.4 mg/dL — ABNORMAL HIGH (ref 0.2–1.2)
Total Protein: 6.5 g/dL (ref 6.1–8.1)

## 2018-09-21 MED ORDER — FLUTICASONE PROPIONATE 50 MCG/ACT NA SUSP: 1.0000 | Freq: Every day | NASAL | 3 refills | Status: DC

## 2018-09-21 MED ORDER — LORATADINE 10 MG PO TABS: 10.0000 mg | ORAL_TABLET | Freq: Every day | ORAL | 3 refills | Status: DC

## 2018-09-21 MED FILL — FLUTICASONE PROP 50 MCG SPR: 50 | 90 days supply | Qty: 48 | Fill #0

## 2018-09-21 NOTE — Progress Notes (Signed)
HPI: Robert Young is a 43 y.o. male who  has a past medical history of Allergy, Idiopathic thrombocythemia ([place]), and Left lower lobe pneumonia ([place]) (08/24/2017).  he presents to [facility] today, 09/21/18,  for chief complaint of: Annual physical      Patient here for annual physical / wellness exam.  See preventive care reviewed as below.    Additional concerns today include:  None         Past medical, surgical, social and family history reviewed:  Patient Active Problem List   Diagnosis Date Noted  . Left lower lobe pneumonia ([place]) 08/24/2017  . Persistent cough for 3 weeks or longer 08/19/2017  . Idiopathic thrombocytopenic purpura ([place]) 07/28/2015  . Allergic rhinitis 03/30/2010    Past Surgical History:  Procedure Laterality Date  . TONSILLECTOMY    . TONSILLECTOMY AND ADENOIDECTOMY      Social History   Tobacco Use  . Smoking status: Never Smoker  . Smokeless tobacco: Former Systems developer    Types: Chew  . Tobacco comment: NEVER USED TOBACCO  Substance Use Topics  . Alcohol use: Yes    Alcohol/week: 0.0 standard drinks    Comment: few drinks per week     Family History  Problem Relation Age of Onset  . Heart disease Father   . Hyperlipidemia Father   . Hypertension Father   . Stroke Father        at age 43's   . Heart disease Paternal Uncle   . Cancer Paternal Grandmother        breast     Current medication list and allergy/intolerance information reviewed:    Current Outpatient Medications  Medication Sig Dispense Refill  . fluticasone (FLONASE) 50 MCG/ACT nasal spray Place 1-2 sprays into both nostrils daily. 48 g 3  . loratadine (CLARITIN) 10 MG tablet Take 10 mg by mouth daily.     No current facility-administered medications for this visit.     Allergies  Allergen Reactions  . Zyrtec [Cetirizine]     BP elevated      Review of Systems:  Constitutional:  No  fever, no chills, No recent  illness, No unintentional weight changes. No significant fatigue.   HEENT: No  headache, no vision change, no hearing change, No sore throat, No  sinus pressure  Cardiac: No  chest pain, No  pressure, No palpitations  Respiratory:  No  shortness of breath. No  Cough  Gastrointestinal: No  abdominal pain, No  nausea, No  vomiting,  No  blood in stool, No  diarrhea, No  constipation   Musculoskeletal: No new myalgia/arthralgia  Skin: No  Rash, No other wounds/concerning lesions  Genitourinary: No  incontinence, No  abnormal genital bleeding, No abnormal genital discharge  Hem/Onc: No  easy bruising/bleeding, No  abnormal lymph node  Endocrine: No cold intolerance,  No heat intolerance. No polyuria/polydipsia/polyphagia   Neurologic: No  weakness, No  dizziness, No  slurred speech/focal weakness/facial droop  Psychiatric: No  concerns with depression, No  concerns with anxiety, No sleep problems, No mood problems  Exam:  BP 135/75 (BP Location: Left Arm, Patient Position: Sitting, Cuff Size: Normal)   Pulse (!) 45   Wt 178 lb 6.4 oz (80.9 kg)   BMI 26.35 kg/m   Constitutional: VS see above. General Appearance: alert, well-developed, well-nourished, NAD  Eyes: Normal lids and conjunctive, non-icteric sclera  Ears, Nose, Mouth, Throat: MMM, Normal external inspection ears/nares/mouth/lips/gums. TM normal bilaterally. Pharynx/tonsils  no erythema, no exudate. Nasal mucosa normal.   Neck: No masses, trachea midline. No thyroid enlargement. No tenderness/mass appreciated. No lymphadenopathy  Respiratory: Normal respiratory effort. no wheeze, no rhonchi, no rales  Cardiovascular: S1/S2 normal, no murmur, no rub/gallop auscultated. RRR. No lower extremity edema.   Gastrointestinal: Nontender, no masses. No hepatomegaly, no splenomegaly. No hernia appreciated. Bowel sounds normal. Rectal exam deferred.   Musculoskeletal: Gait normal. No clubbing/cyanosis of digits.   Neurological:  Normal balance/coordination. No tremor. No cranial nerve deficit on limited exam. Motor and sensation intact and symmetric. Cerebellar reflexes intact.   Skin: warm, dry, intact. No rash/ulcer. No concerning nevi or subq nodules on limited exam.    Psychiatric: Normal judgment/insight. Normal mood and affect. Oriented x3.        ASSESSMENT/PLAN: The primary encounter diagnosis was Annual physical exam. A diagnosis of Need for Tdap vaccination was also pertinent to this visit.   Orders Placed This Encounter  Procedures  . Tdap vaccine greater than or equal to 7yo IM  . CBC with Differential/Platelet  . COMPLETE METABOLIC PANEL WITH GFR  . Lipid panel    Meds ordered this encounter  Medications  . fluticasone (FLONASE) 50 MCG/ACT nasal spray    Sig: Place 1-2 sprays into both nostrils daily.    Dispense:  48 g    Refill:  3  . loratadine (CLARITIN) 10 MG tablet    Sig: Take 1 tablet (10 mg total) by mouth daily.    Dispense:  90 tablet    Refill:  3    Patient Instructions  General Preventive Care  Most recent routine screening lipids/other labs: ordered today.   Everyone should have blood pressure checked once per year.   Tobacco: don't!   Alcohol: responsible moderation is ok for most adults - if you ever have concerns about your alcohol intake, please talk to me!   Exercise: as tolerated to reduce risk of cardiovascular disease and diabetes. Strength training will also prevent osteoporosis.   Mental health: if need for mental health care (medicines, counseling, other), or concerns about moods, please let me know!   Sexual/Reproductive health: if ever a need for STD testing, or if concerns with libido/pain problems, please let me know!   Advanced Directive: Living Will and/or Healthcare Power of Attorney recommended for all adults, regardless of age or health.  Vaccines  Flu vaccine: recommended for almost everyone, every fall.   Shingles vaccine: Shingrix  recommended after age 48.   Pneumonia vaccines: Prevnar and Pneumovax recommended after age 50, or sooner if certain medical conditions.  Tetanus booster: Tdap recommended every 10 years. Updated today.  Cancer screenings   Colon cancer screening: recommended for everyone at age 66, but some folks need a colonoscopy sooner if risk factors   Prostate cancer screening: PSA blood test around age 40  Lung cancer screening: not needed for non-smokers Infection screenings . HIV, Gonorrhea/Chlamydia: screening as needed. . Hepatitis C: recommended for anyone born [birth year]-1965 . TB: certain at-risk populations, or depending on work requirements and/or travel history Other . Bone Density Test: recommended for men at age 65 . Abdominal Aortic Aneurysm: screening with ultrasound recommended once for men age 18-75 who have ever smoked             Visit summary with medication list and pertinent instructions was printed for patient to review. All questions at time of visit were answered - patient instructed to contact office with any additional concerns or updates. ER/RTC precautions  were reviewed with the patient.    Please note: voice recognition software was used to produce this document, and typos may escape review. Please contact Dr. Sheppard Coil for any needed clarifications.     Follow-up plan: Return in about 1 year (around 09/22/2019) for [place], sooner if needed .

## 2018-09-21 NOTE — Patient Instructions (Addendum)
General Preventive Care  Most recent routine screening lipids/other labs: ordered today.   Everyone should have blood pressure checked once per year.   Tobacco: don't!   Alcohol: responsible moderation is ok for most adults - if you ever have concerns about your alcohol intake, please talk to me!   Exercise: as tolerated to reduce risk of cardiovascular disease and diabetes. Strength training will also prevent osteoporosis.   Mental health: if need for mental health care (medicines, counseling, other), or concerns about moods, please let me know!   Sexual/Reproductive health: if ever a need for STD testing, or if concerns with libido/pain problems, please let me know!   Advanced Directive: Living Will and/or Healthcare Power of Attorney recommended for all adults, regardless of age or health.  Vaccines  Flu vaccine: recommended for almost everyone, every fall.   Shingles vaccine: Shingrix recommended after age 31.   Pneumonia vaccines: Prevnar and Pneumovax recommended after age 3, or sooner if certain medical conditions.  Tetanus booster: Tdap recommended every 10 years. Updated today.  Cancer screenings   Colon cancer screening: recommended for everyone at age 39, but some folks need a colonoscopy sooner if risk factors   Prostate cancer screening: PSA blood test around age 20  Lung cancer screening: not needed for non-smokers Infection screenings . HIV, Gonorrhea/Chlamydia: screening as needed. . Hepatitis C: recommended for anyone born 26-1963 . TB: certain at-risk populations, or depending on work requirements and/or travel history Other . Bone Density Test: recommended for men at age 24 . Abdominal Aortic Aneurysm: screening with ultrasound recommended once for men age 74-75 who have ever smoked

## 2018-12-28 ENCOUNTER — Inpatient Hospital Stay: Payer: 59 | Attending: Hematology & Oncology | Admitting: Hematology & Oncology

## 2018-12-28 ENCOUNTER — Inpatient Hospital Stay: Payer: 59

## 2018-12-28 ENCOUNTER — Encounter: Payer: Self-pay | Admitting: Hematology & Oncology

## 2018-12-28 ENCOUNTER — Other Ambulatory Visit: Payer: Self-pay

## 2018-12-28 VITALS — BP 125/80 | HR 57 | Temp 97.5°F | Resp 16 | Wt 175.0 lb

## 2018-12-28 DIAGNOSIS — D693 Immune thrombocytopenic purpura: Secondary | ICD-10-CM

## 2018-12-28 LAB — CBC WITH DIFFERENTIAL (CANCER CENTER ONLY)
Abs Immature Granulocytes: 0.01 10*3/uL (ref 0.00–0.07)
Basophils Absolute: 0 10*3/uL (ref 0.0–0.1)
Basophils Relative: 0 %
Eosinophils Absolute: 0.5 10*3/uL (ref 0.0–0.5)
Eosinophils Relative: 10 %
HCT: 43.2 % (ref 39.0–52.0)
Hemoglobin: 15 g/dL (ref 13.0–17.0)
Immature Granulocytes: 0 %
Lymphocytes Relative: 29 %
Lymphs Abs: 1.6 10*3/uL (ref 0.7–4.0)
MCH: 30.9 pg (ref 26.0–34.0)
MCHC: 34.7 g/dL (ref 30.0–36.0)
MCV: 88.9 fL (ref 80.0–100.0)
Monocytes Absolute: 0.5 10*3/uL (ref 0.1–1.0)
Monocytes Relative: 9 %
Neutro Abs: 2.8 10*3/uL (ref 1.7–7.7)
Neutrophils Relative %: 52 %
Platelet Count: 127 10*3/uL — ABNORMAL LOW (ref 150–400)
RBC: 4.86 MIL/uL (ref 4.22–5.81)
RDW: 11.7 % (ref 11.5–15.5)
WBC Count: 5.4 10*3/uL (ref 4.0–10.5)
nRBC: 0 % (ref 0.0–0.2)

## 2018-12-28 LAB — PLATELET BY CITRATE

## 2018-12-28 LAB — SAVE SMEAR(SSMR), FOR PROVIDER SLIDE REVIEW

## 2018-12-28 NOTE — Progress Notes (Signed)
Hematology and Oncology Follow Up Visit  Robert Young [id number] 1975-09-22 43 y.o. 12/28/2018   Principle Diagnosis:  Chronic immune thrombocytopenia  - Relapsed  Current Therapy:  Decadron 40 mg po q day x 4 days every 2 weeks - as needed Nplate - dose adjusted per pharmacy - as indicated     Interim History:  Robert Young is here today for a follow-up.  He is doing quite well.  He has he is doing most of work from home right now.  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  However he says he can do a lot from home now.  He is doing well with respect to the ITP.  His platelet count today is 127,000.  He has had no bleeding.  He has had no bruising.  There is been no abdominal pain.  He is working out.  He is eating without difficulty.  He is having no change in bowel or bladder habits.  Overall, his performance status is ECOG 0.  Medications:  Allergies as of 12/28/2018      Reactions   Zyrtec [cetirizine]    BP elevated      Medication List       Accurate as of December 28, 2018  4:08 PM. Always use your most recent med list.        fluticasone 50 MCG/ACT nasal spray Commonly known as:  FLONASE Place 1-2 sprays into both nostrils daily.   loratadine 10 MG tablet Commonly known as:  CLARITIN Take 1 tablet (10 mg total) by mouth daily.       Allergies:  Allergies  Allergen Reactions  . Zyrtec [Cetirizine]     BP elevated    Past Medical History, Surgical history, Social history, and Family History were reviewed and updated.  Review of Systems: Review of Systems  Constitutional: Negative.   HENT: Negative.   Eyes: Negative.   Respiratory: Negative.   Cardiovascular: Negative.   Gastrointestinal: Negative.   Genitourinary: Negative.   Musculoskeletal: Negative.   Skin: Negative.   Neurological: Negative.   Endo/Heme/Allergies: Negative.   Psychiatric/Behavioral: Negative.      Physical Exam:  weight is 175 lb (79.4 kg). His oral  temperature is 97.5 F (36.4 C) (abnormal). His blood pressure is 125/80 and his pulse is 57 (abnormal). His respiration is 16 and oxygen saturation is 100%.   Wt Readings from Last 3 Encounters:  12/28/18 175 lb (79.4 kg)  09/21/18 178 lb 6.4 oz (80.9 kg)  06/29/18 180 lb 12 oz (82 kg)    Physical Exam Vitals signs reviewed.  HENT:     Head: Normocephalic and atraumatic.  Eyes:     Pupils: Pupils are equal, round, and reactive to light.  Neck:     Musculoskeletal: Normal range of motion.  Cardiovascular:     Rate and Rhythm: Normal rate and regular rhythm.     Heart sounds: Normal heart sounds.  Pulmonary:     Effort: Pulmonary effort is normal.     Breath sounds: Normal breath sounds.  Abdominal:     General: Bowel sounds are normal.     Palpations: Abdomen is soft.  Musculoskeletal: Normal range of motion.        General: No tenderness or deformity.  Lymphadenopathy:     Cervical: No cervical adenopathy.  Skin:    General: Skin is warm and dry.     Findings: No erythema or rash.  Neurological:     Mental Status:  He is alert and oriented to person, place, and time.  Psychiatric:        Behavior: Behavior normal.        Thought Content: Thought content normal.        Judgment: Judgment normal.     Lab Results  Component Value Date   WBC 5.4 12/28/2018   HGB 15.0 12/28/2018   HCT 43.2 12/28/2018   MCV 88.9 12/28/2018   PLT 127 (L) 12/28/2018   No results found for: FERRITIN, IRON, TIBC, UIBC, IRONPCTSAT Lab Results  Component Value Date   RBC 4.86 12/28/2018   No results found for: KPAFRELGTCHN, LAMBDASER, KAPLAMBRATIO No results found for: Kandis Cocking, IGMSERUM No results found for: Odetta Pink, SPEI   Chemistry      Component Value Date/Time   NA 141 09/21/2018 0910   NA 142 07/01/2017 1246   K 4.2 09/21/2018 0910   K 4.1 07/01/2017 1246   CL 103 09/21/2018 0910   CL 94 (L) 09/06/2016 1502    CO2 31 09/21/2018 0910   CO2 30 (H) 07/01/2017 1246   BUN 18 09/21/2018 0910   BUN 12.4 07/01/2017 1246   CREATININE 1.22 09/21/2018 0910   CREATININE 1.1 07/01/2017 1246      Component Value Date/Time   CALCIUM 9.4 09/21/2018 0910   CALCIUM 9.1 07/01/2017 1246   ALKPHOS 61 07/01/2017 1246   AST 20 09/21/2018 0910   AST 19 07/01/2017 1246   ALT 13 09/21/2018 0910   ALT 13 07/01/2017 1246   BILITOT 1.4 (H) 09/21/2018 0910   BILITOT 0.66 07/01/2017 1246     Impression and Plan: Robert Young is a 43 year old white male. He has relapsed immune thrombocytopenia. His platelet count actually is doing quite well for him.  Everything looks great.  I looked at his blood under the microscope.  I did not see anything that looked suspicious.  We will get him back in 6 months.  I do not think we need any lab work in between visits.  He certainly knows that he can always come in for lab work if he starts to have any problems with bleeding or bruising.  Volanda Napoleon, MD 4/30/20204:08 PM

## 2018-12-29 LAB — LACTATE DEHYDROGENASE: LDH: 195 U/L — ABNORMAL HIGH (ref 98–192)

## 2019-04-16 IMAGING — CR DG CHEST 2V
2 series · 2 of 2 positions shown · non-contrast
Comparison: 08/19/2017

CLINICAL DATA: Cough and congestion for several months

EXAM:
CHEST  2 VIEW

[w chest pa]
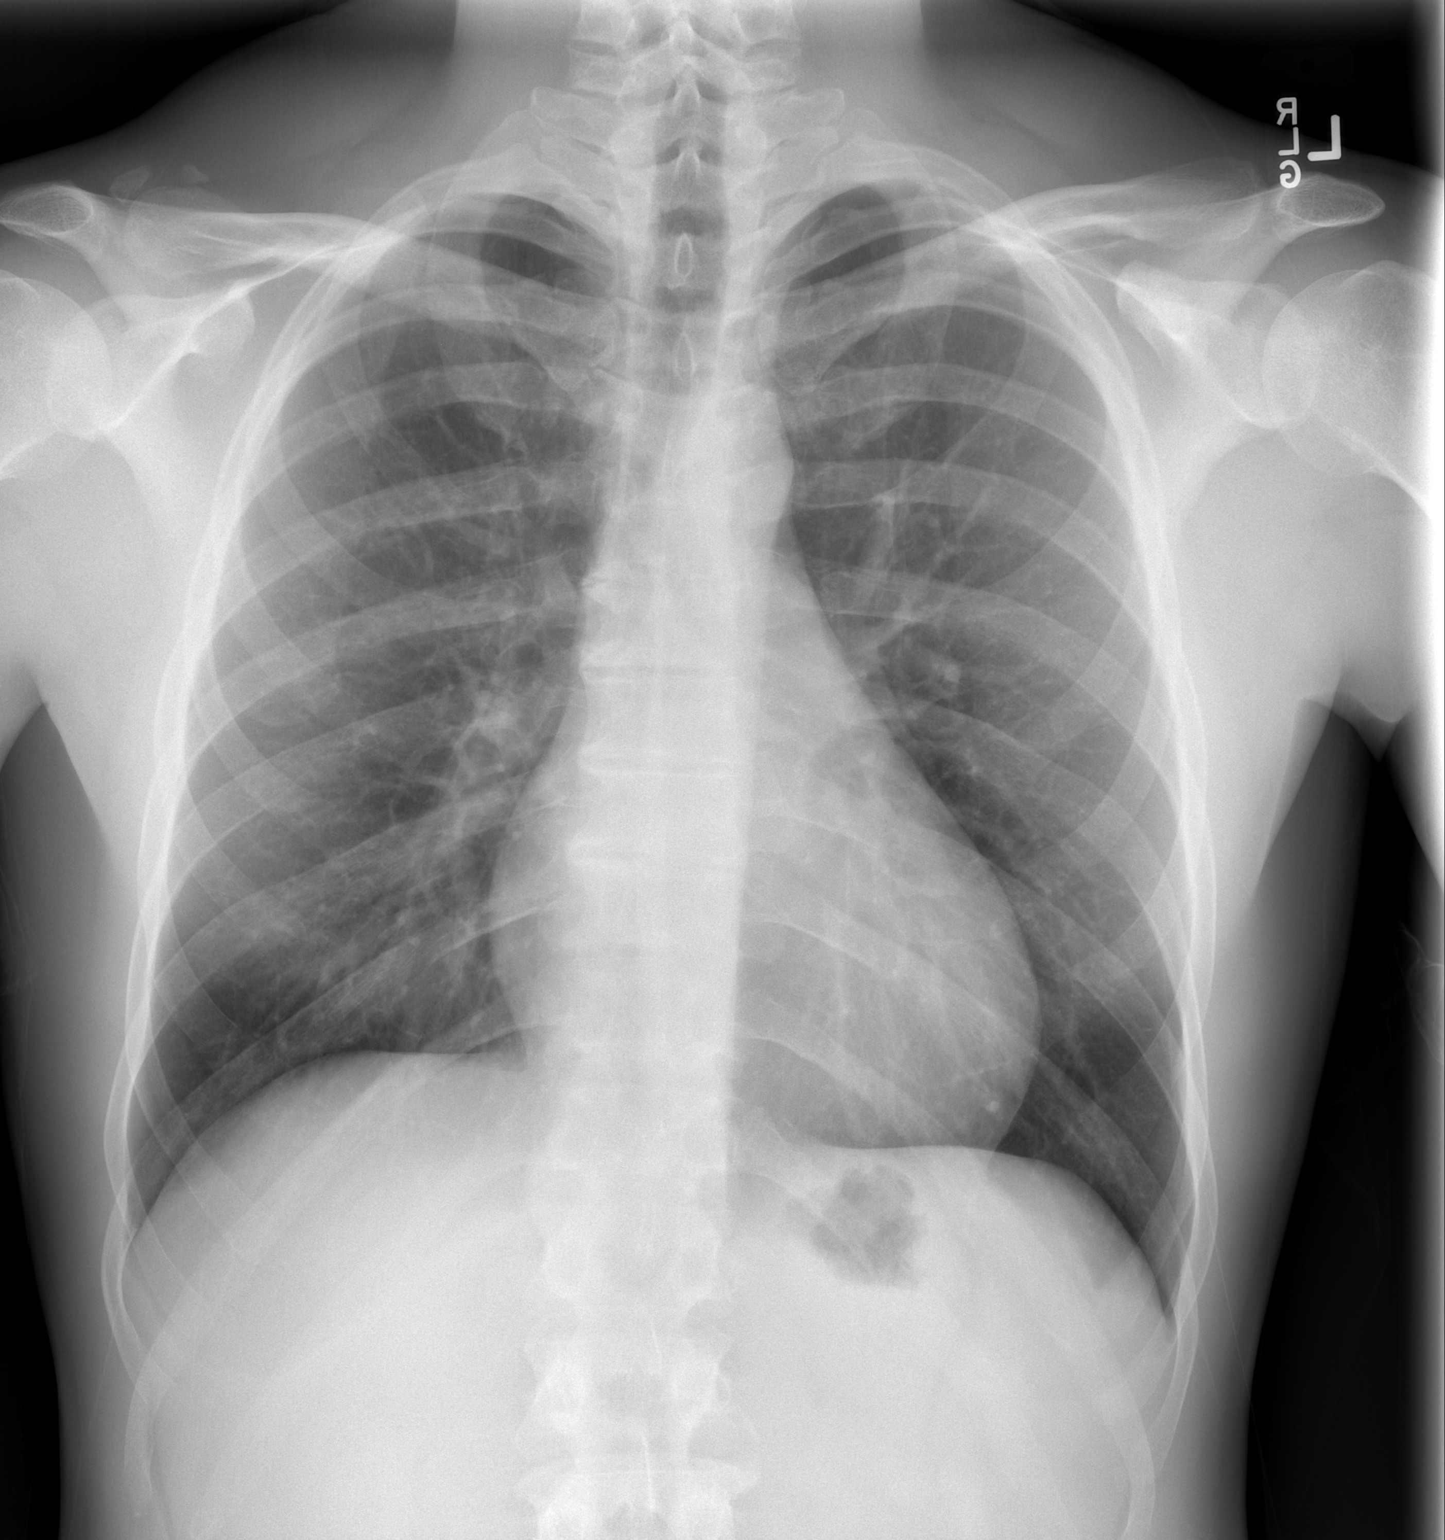

[w chest lat]
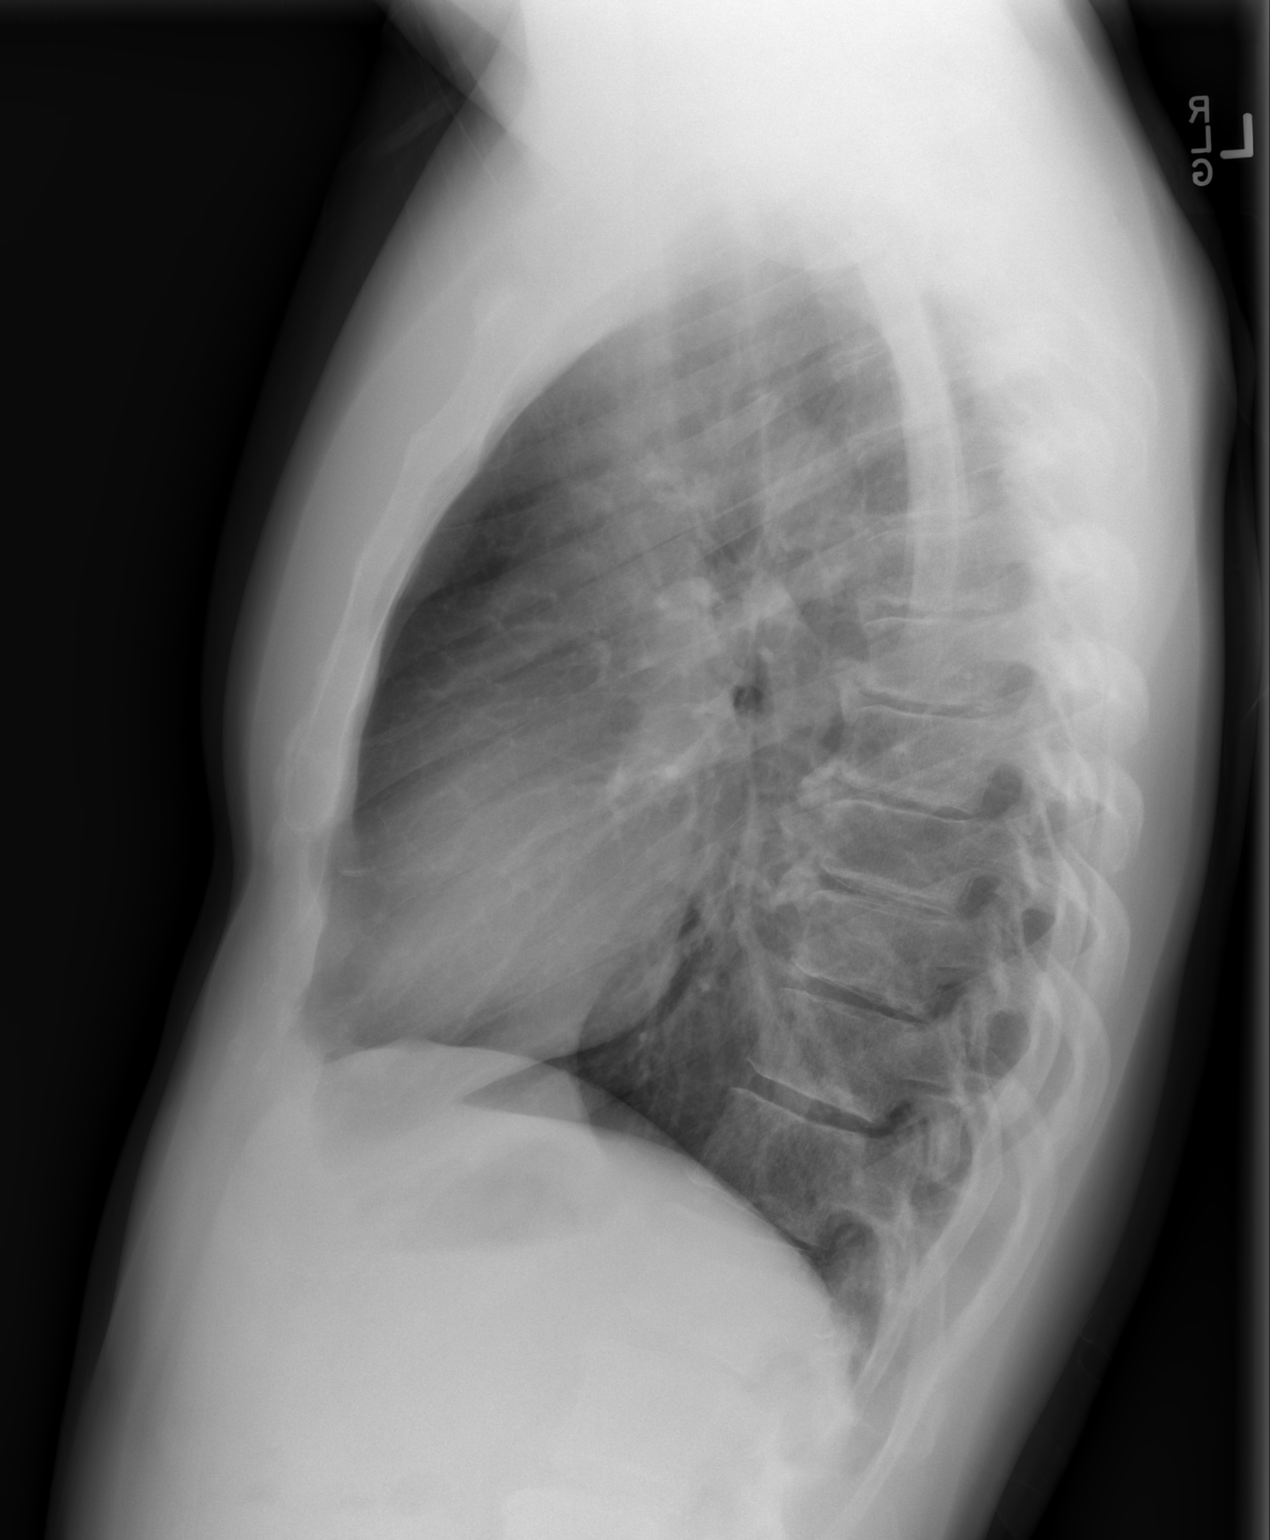

[2 of 2 positions shown; findings below may reference images not displayed]

FINDINGS: Cardiac shadow is within normal limits. The lungs are well aerated
bilaterally. Previously seen left lower lobe infiltrate has
resolved. No new focal infiltrate is seen. No bony abnormality is
noted.
IMPRESSION: No acute abnormality seen.

## 2019-04-26 ENCOUNTER — Other Ambulatory Visit: Payer: 59

## 2019-04-26 ENCOUNTER — Ambulatory Visit: Payer: 59 | Admitting: Hematology & Oncology

## 2019-06-28 ENCOUNTER — Inpatient Hospital Stay: Payer: 59 | Attending: Hematology & Oncology

## 2019-06-28 ENCOUNTER — Other Ambulatory Visit: Payer: Self-pay

## 2019-06-28 ENCOUNTER — Inpatient Hospital Stay (HOSPITAL_BASED_OUTPATIENT_CLINIC_OR_DEPARTMENT_OTHER): Payer: 59 | Admitting: Hematology & Oncology

## 2019-06-28 ENCOUNTER — Encounter: Payer: Self-pay | Admitting: Hematology & Oncology

## 2019-06-28 VITALS — BP 139/90 | HR 59 | Temp 97.7°F | Resp 17 | Wt 178.0 lb

## 2019-06-28 DIAGNOSIS — Z79899 Other long term (current) drug therapy: Secondary | ICD-10-CM | POA: Diagnosis not present

## 2019-06-28 DIAGNOSIS — D693 Immune thrombocytopenic purpura: Secondary | ICD-10-CM | POA: Diagnosis not present

## 2019-06-28 LAB — CBC WITH DIFFERENTIAL (CANCER CENTER ONLY)
Abs Immature Granulocytes: 0.02 10*3/uL (ref 0.00–0.07)
Basophils Absolute: 0 10*3/uL (ref 0.0–0.1)
Basophils Relative: 0 %
Eosinophils Absolute: 0.3 10*3/uL (ref 0.0–0.5)
Eosinophils Relative: 5 %
HCT: 44 % (ref 39.0–52.0)
Hemoglobin: 15.3 g/dL (ref 13.0–17.0)
Immature Granulocytes: 0 %
Lymphocytes Relative: 28 %
Lymphs Abs: 1.8 10*3/uL (ref 0.7–4.0)
MCH: 31 pg (ref 26.0–34.0)
MCHC: 34.8 g/dL (ref 30.0–36.0)
MCV: 89.2 fL (ref 80.0–100.0)
Monocytes Absolute: 0.5 10*3/uL (ref 0.1–1.0)
Monocytes Relative: 8 %
Neutro Abs: 3.8 10*3/uL (ref 1.7–7.7)
Neutrophils Relative %: 59 %
Platelet Count: 116 10*3/uL — ABNORMAL LOW (ref 150–400)
RBC: 4.93 MIL/uL (ref 4.22–5.81)
RDW: 11.6 % (ref 11.5–15.5)
WBC Count: 6.5 10*3/uL (ref 4.0–10.5)
nRBC: 0 % (ref 0.0–0.2)

## 2019-06-28 LAB — SAVE SMEAR(SSMR), FOR PROVIDER SLIDE REVIEW

## 2019-06-28 NOTE — Progress Notes (Signed)
Hematology and Oncology Follow Up Visit  Robert Young [id number] 02-20-1976 43 y.o. 06/28/2019   Principle Diagnosis:  Chronic immune thrombocytopenia  - Relapsed  Current Therapy:  Decadron 40 mg po q day x 4 days every 2 weeks - as needed Nplate - dose adjusted per pharmacy - as indicated     Interim History:  Robert Young is here today for a follow-up.  He is doing quite well.  Everything is going quite well.  He is working over at [employer].  He is one of the physicist.  He has had no problems with bleeding.  He has had no issues with nausea or vomiting.  There is been no bruising.  Has had no rashes.  There is been no change in bowel or bladder habits.  He has had no headache.  He has been very conscientious with respect to the coronavirus.  Overall, his performance status is ECOG 0.  Medications:  Allergies as of 06/28/2019      Reactions   Zyrtec [cetirizine]    BP elevated      Medication List       Accurate as of June 28, 2019  3:57 PM. If you have any questions, ask your nurse or doctor.        fluticasone 50 MCG/ACT nasal spray Commonly known as: FLONASE Place 1-2 sprays into both nostrils daily.   loratadine 10 MG tablet Commonly known as: CLARITIN Take 1 tablet (10 mg total) by mouth daily.       Allergies:  Allergies  Allergen Reactions  . Zyrtec [Cetirizine]     BP elevated    Past Medical History, Surgical history, Social history, and Family History were reviewed and updated.  Review of Systems: Review of Systems  Constitutional: Negative.   HENT: Negative.   Eyes: Negative.   Respiratory: Negative.   Cardiovascular: Negative.   Gastrointestinal: Negative.   Genitourinary: Negative.   Musculoskeletal: Negative.   Skin: Negative.   Neurological: Negative.   Endo/Heme/Allergies: Negative.   Psychiatric/Behavioral: Negative.      Physical Exam:  weight is 178 lb (80.7 kg). His temporal temperature is 97.7 F (36.5  C). His blood pressure is 139/90 and his pulse is 59 (abnormal). His respiration is 17 and oxygen saturation is 100%.   Wt Readings from Last 3 Encounters:  06/28/19 178 lb (80.7 kg)  12/28/18 175 lb (79.4 kg)  09/21/18 178 lb 6.4 oz (80.9 kg)    Physical Exam Vitals signs reviewed.  HENT:     Head: Normocephalic and atraumatic.  Eyes:     Pupils: Pupils are equal, round, and reactive to light.  Neck:     Musculoskeletal: Normal range of motion.  Cardiovascular:     Rate and Rhythm: Normal rate and regular rhythm.     Heart sounds: Normal heart sounds.  Pulmonary:     Effort: Pulmonary effort is normal.     Breath sounds: Normal breath sounds.  Abdominal:     General: Bowel sounds are normal.     Palpations: Abdomen is soft.  Musculoskeletal: Normal range of motion.        General: No tenderness or deformity.  Lymphadenopathy:     Cervical: No cervical adenopathy.  Skin:    General: Skin is warm and dry.     Findings: No erythema or rash.  Neurological:     Mental Status: He is alert and oriented to person, place, and time.  Psychiatric:  Behavior: Behavior normal.        Thought Content: Thought content normal.        Judgment: Judgment normal.     Lab Results  Component Value Date   WBC 6.5 06/28/2019   HGB 15.3 06/28/2019   HCT 44.0 06/28/2019   MCV 89.2 06/28/2019   PLT 116 (L) 06/28/2019   No results found for: FERRITIN, IRON, TIBC, UIBC, IRONPCTSAT Lab Results  Component Value Date   RBC 4.93 06/28/2019   No results found for: KPAFRELGTCHN, LAMBDASER, KAPLAMBRATIO No results found for: Kandis Cocking, IGMSERUM No results found for: Odetta Pink, SPEI   Chemistry      Component Value Date/Time   NA 141 09/21/2018 0910   NA 142 07/01/2017 1246   K 4.2 09/21/2018 0910   K 4.1 07/01/2017 1246   CL 103 09/21/2018 0910   CL 94 (L) 09/06/2016 1502   CO2 31 09/21/2018 0910   CO2 30 (H)  07/01/2017 1246   BUN 18 09/21/2018 0910   BUN 12.4 07/01/2017 1246   CREATININE 1.22 09/21/2018 0910   CREATININE 1.1 07/01/2017 1246      Component Value Date/Time   CALCIUM 9.4 09/21/2018 0910   CALCIUM 9.1 07/01/2017 1246   ALKPHOS 61 07/01/2017 1246   AST 20 09/21/2018 0910   AST 19 07/01/2017 1246   ALT 13 09/21/2018 0910   ALT 13 07/01/2017 1246   BILITOT 1.4 (H) 09/21/2018 0910   BILITOT 0.66 07/01/2017 1246     Impression and Plan: Robert Young is a 43 year old white male. He has relapsed immune thrombocytopenia. His platelet count actually is doing quite well for him.  Everything looks great.  I looked at his blood under the microscope.  I did not see anything that looked suspicious.  We will get him back in 6 months.  I do not think we need any lab work in between visits.  He certainly knows that he can always come in for lab work if he starts to have any problems with bleeding or bruising.  Volanda Napoleon, MD 10/29/20203:57 PM

## 2019-06-29 ENCOUNTER — Telehealth: Payer: Self-pay | Admitting: Hematology & Oncology

## 2019-06-29 NOTE — Telephone Encounter (Signed)
Spoke with patient to confirm 12/27/19 appt at 130 pm per 10/29 LOS

## 2019-08-15 DIAGNOSIS — H524 Presbyopia: Secondary | ICD-10-CM | POA: Diagnosis not present

## 2019-08-22 ENCOUNTER — Encounter: Payer: Self-pay | Admitting: Hematology & Oncology

## 2019-11-29 ENCOUNTER — Inpatient Hospital Stay (HOSPITAL_BASED_OUTPATIENT_CLINIC_OR_DEPARTMENT_OTHER): Payer: 59 | Admitting: Hematology & Oncology

## 2019-11-29 ENCOUNTER — Telehealth: Payer: Self-pay | Admitting: Hematology & Oncology

## 2019-11-29 ENCOUNTER — Encounter: Payer: Self-pay | Admitting: Hematology & Oncology

## 2019-11-29 ENCOUNTER — Inpatient Hospital Stay: Payer: 59 | Attending: Hematology & Oncology

## 2019-11-29 ENCOUNTER — Other Ambulatory Visit: Payer: Self-pay

## 2019-11-29 ENCOUNTER — Encounter: Payer: Self-pay | Admitting: *Deleted

## 2019-11-29 VITALS — BP 147/82 | HR 65 | Temp 98.7°F | Resp 16 | Wt 178.0 lb

## 2019-11-29 DIAGNOSIS — D693 Immune thrombocytopenic purpura: Secondary | ICD-10-CM | POA: Diagnosis not present

## 2019-11-29 DIAGNOSIS — R590 Localized enlarged lymph nodes: Secondary | ICD-10-CM | POA: Insufficient documentation

## 2019-11-29 DIAGNOSIS — Z9081 Acquired absence of spleen: Secondary | ICD-10-CM | POA: Diagnosis not present

## 2019-11-29 DIAGNOSIS — Z7952 Long term (current) use of systemic steroids: Secondary | ICD-10-CM | POA: Insufficient documentation

## 2019-11-29 LAB — CMP (CANCER CENTER ONLY)
ALT: 19 U/L (ref 0–44)
AST: 22 U/L (ref 15–41)
Albumin: 4.6 g/dL (ref 3.5–5.0)
Alkaline Phosphatase: 47 U/L (ref 38–126)
Anion gap: 11 (ref 5–15)
BUN: 19 mg/dL (ref 6–20)
CO2: 27 mmol/L (ref 22–32)
Calcium: 9.1 mg/dL (ref 8.9–10.3)
Chloride: 104 mmol/L (ref 98–111)
Creatinine: 1.24 mg/dL (ref 0.61–1.24)
GFR, Est AFR Am: >60 mL/min (ref >60)
GFR, Estimated: >60 mL/min (ref >60)
Glucose, Bld: 94 mg/dL (ref 70–99)
Potassium: 4.1 mmol/L (ref 3.5–5.1)
Sodium: 142 mmol/L (ref 135–145)
Total Bilirubin: 1.3 mg/dL — ABNORMAL HIGH (ref 0.3–1.2)
Total Protein: 6.9 g/dL (ref 6.5–8.1)

## 2019-11-29 LAB — CBC WITH DIFFERENTIAL (CANCER CENTER ONLY)
Abs Immature Granulocytes: 0 10*3/uL (ref 0.00–0.07)
Basophils Absolute: 0 10*3/uL (ref 0.0–0.1)
Basophils Relative: 0 %
Eosinophils Absolute: 0.3 10*3/uL (ref 0.0–0.5)
Eosinophils Relative: 5 %
HCT: 43.5 % (ref 39.0–52.0)
Hemoglobin: 14.9 g/dL (ref 13.0–17.0)
Immature Granulocytes: 0 %
Lymphocytes Relative: 28 %
Lymphs Abs: 1.5 10*3/uL (ref 0.7–4.0)
MCH: 29.9 pg (ref 26.0–34.0)
MCHC: 34.3 g/dL (ref 30.0–36.0)
MCV: 87.3 fL (ref 80.0–100.0)
Monocytes Absolute: 0.5 10*3/uL (ref 0.1–1.0)
Monocytes Relative: 10 %
Neutro Abs: 3 10*3/uL (ref 1.7–7.7)
Neutrophils Relative %: 57 %
Platelet Count: 21 10*3/uL — ABNORMAL LOW (ref 150–400)
RBC: 4.98 MIL/uL (ref 4.22–5.81)
RDW: 11.7 % (ref 11.5–15.5)
WBC Count: 5.4 10*3/uL (ref 4.0–10.5)
nRBC: 0 % (ref 0.0–0.2)

## 2019-11-29 LAB — PLATELET BY CITRATE

## 2019-11-29 MED ORDER — DEXAMETHASONE 4 MG PO TABS: 40.0000 mg | ORAL_TABLET | Freq: Two times a day (BID) | ORAL | 1 refills | Status: DC

## 2019-11-29 MED FILL — DEXAMETHASONE 4 MG TABLET: 4 | 10 days supply | Qty: 45 | Fill #0

## 2019-11-29 NOTE — Telephone Encounter (Signed)
Appointments scheduled patient has My Chart access for appt info/ per 4/1 los

## 2019-11-29 NOTE — Progress Notes (Signed)
Dr. Marin Olp notified of platelet count of 21.  Order received for pt to come in today or tomorrow to see Dr. Marin Olp.  Message sent to scheduling.

## 2019-11-29 NOTE — Progress Notes (Signed)
Hematology and Oncology Follow Up Visit  DUMONT CHARANIA [id number] 1975-10-30 44 y.o. 11/29/2019   Principle Diagnosis:  Chronic immune thrombocytopenia  - Relapsed  Current Therapy:  Decadron 40 mg po q day x 4 days every 2 weeks - as needed Nplate - dose adjusted per pharmacy - as indicated     Interim History:  Mr. Wold is here today for an early follow-up.  Unfortunately, looks like he has a relapse of the immune thrombocytopenia.  He noted that he has been having some bruising and some petechia.  He has not been on any new medication.  He did get the coronavirus vaccine back in January.  He has had no fever.  He has had no swollen lymph nodes.  He said he had some swollen lymph nodes on his neck after the coronavirus vaccine.  There is been no obvious bleeding.  There is no change in bowel or bladder habits.  He has had no nausea or vomiting.  There has been no cough.  He had lab work done today.  Unfortunately, his platelet count is now 21,000.  It has been a little over 3 years that he has had a relapse.  When he had the relapse we treated him with Nplate and Decadron.  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  Otherwise, he has been doing quite well.  He has had no travel.  He has not eaten anything unusual.  Overall, his performance status is ECOG 0.   Medications:  Allergies as of 11/29/2019      Reactions   Zyrtec [cetirizine]    BP elevated      Medication List       Accurate as of November 29, 2019  2:49 PM. If you have any questions, ask your nurse or doctor.        STOP taking these medications   fluticasone 50 MCG/ACT nasal spray Commonly known as: FLONASE Stopped by: Volanda Napoleon, MD   loratadine 10 MG tablet Commonly known as: CLARITIN Stopped by: Volanda Napoleon, MD     TAKE these medications   dexamethasone 4 MG tablet Commonly known as: DECADRON Take 10 tablets (40 mg total) by mouth 2 (two) times daily  with a meal. Started by: Volanda Napoleon, MD       Allergies:  Allergies  Allergen Reactions  . Zyrtec [Cetirizine]     BP elevated    Past Medical History, Surgical history, Social history, and Family History were reviewed and updated.  Review of Systems: Review of Systems  Constitutional: Negative.   HENT: Negative.   Eyes: Negative.   Respiratory: Negative.   Cardiovascular: Negative.   Gastrointestinal: Negative.   Genitourinary: Negative.   Musculoskeletal: Negative.   Skin: Negative.   Neurological: Negative.   Endo/Heme/Allergies: Negative.   Psychiatric/Behavioral: Negative.      Physical Exam:  weight is 178 lb (80.7 kg). His temporal temperature is 98.7 F (37.1 C). His blood pressure is 147/82 (abnormal) and his pulse is 65. His respiration is 16 and oxygen saturation is 100%.   Wt Readings from Last 3 Encounters:  11/29/19 178 lb (80.7 kg)  06/28/19 178 lb (80.7 kg)  12/28/18 175 lb (79.4 kg)    Physical Exam Vitals reviewed.  HENT:     Head: Normocephalic and atraumatic.  Eyes:     Pupils: Pupils are equal, round, and reactive to light.  Cardiovascular:     Rate  and Rhythm: Normal rate and regular rhythm.     Heart sounds: Normal heart sounds.  Pulmonary:     Effort: Pulmonary effort is normal.     Breath sounds: Normal breath sounds.  Abdominal:     General: Bowel sounds are normal.     Palpations: Abdomen is soft.  Musculoskeletal:        General: No tenderness or deformity. Normal range of motion.     Cervical back: Normal range of motion.  Lymphadenopathy:     Cervical: No cervical adenopathy.  Skin:    General: Skin is warm and dry.     Findings: No erythema or rash.  Neurological:     Mental Status: He is alert and oriented to person, place, and time.  Psychiatric:        Behavior: Behavior normal.        Thought Content: Thought content normal.        Judgment: Judgment normal.     Lab Results  Component Value Date   WBC  5.4 11/29/2019   HGB 14.9 11/29/2019   HCT 43.5 11/29/2019   MCV 87.3 11/29/2019   PLT 21 (L) 11/29/2019   No results found for: FERRITIN, IRON, TIBC, UIBC, IRONPCTSAT Lab Results  Component Value Date   RBC 4.98 11/29/2019   No results found for: KPAFRELGTCHN, LAMBDASER, KAPLAMBRATIO No results found for: IGGSERUM, IGA, IGMSERUM No results found for: Odetta Pink, SPEI   Chemistry      Component Value Date/Time   NA 142 11/29/2019 1001   NA 142 07/01/2017 1246   K 4.1 11/29/2019 1001   K 4.1 07/01/2017 1246   CL 104 11/29/2019 1001   CL 94 (L) 09/06/2016 1502   CO2 27 11/29/2019 1001   CO2 30 (H) 07/01/2017 1246   BUN 19 11/29/2019 1001   BUN 12.4 07/01/2017 1246   CREATININE 1.24 11/29/2019 1001   CREATININE 1.22 09/21/2018 0910   CREATININE 1.1 07/01/2017 1246      Component Value Date/Time   CALCIUM 9.1 11/29/2019 1001   CALCIUM 9.1 07/01/2017 1246   ALKPHOS 47 11/29/2019 1001   ALKPHOS 61 07/01/2017 1246   AST 22 11/29/2019 1001   AST 19 07/01/2017 1246   ALT 19 11/29/2019 1001   ALT 13 07/01/2017 1246   BILITOT 1.3 (H) 11/29/2019 1001   BILITOT 0.66 07/01/2017 1246     Impression and Plan: Mr. Daughdrill is a 44 year old white male. He has relapsed immune thrombocytopenia. His platelet count actually is doing quite well for him.  I just hate the fact that he is relapse.  It has been quite a while since he had a relapse.  I know that I talked to him in the past about a splenectomy.  Is still think this might be needed at some point down the road but for right now, we will have him on a Decadron taper.  I do not think we have to use Nplate.  He really is not symptomatic and his platelet count has not that low.  I wrote out the steroid taper with Decadron.  This will be a taper over 9 days so that he will not have a steroid "low."  He we will check his labs weekly while on Decadron.  If we do not see that he has a  good response, then we will give him a dose of Nplate.  I had a schedule appointment for him him in April.  We will keep that appointment.    Volanda Napoleon, MD 4/1/20212:49 PM

## 2019-12-04 ENCOUNTER — Inpatient Hospital Stay: Payer: 59

## 2019-12-04 ENCOUNTER — Other Ambulatory Visit: Payer: Self-pay

## 2019-12-04 ENCOUNTER — Encounter: Payer: Self-pay | Admitting: *Deleted

## 2019-12-04 DIAGNOSIS — R590 Localized enlarged lymph nodes: Secondary | ICD-10-CM | POA: Diagnosis not present

## 2019-12-04 DIAGNOSIS — D693 Immune thrombocytopenic purpura: Secondary | ICD-10-CM

## 2019-12-04 DIAGNOSIS — Z7952 Long term (current) use of systemic steroids: Secondary | ICD-10-CM | POA: Diagnosis not present

## 2019-12-04 DIAGNOSIS — Z9081 Acquired absence of spleen: Secondary | ICD-10-CM | POA: Diagnosis not present

## 2019-12-04 LAB — CBC WITH DIFFERENTIAL (CANCER CENTER ONLY)
Abs Immature Granulocytes: 0.05 10*3/uL (ref 0.00–0.07)
Basophils Absolute: 0 10*3/uL (ref 0.0–0.1)
Basophils Relative: 0 %
Eosinophils Absolute: 0 10*3/uL (ref 0.0–0.5)
Eosinophils Relative: 0 %
HCT: 42.9 % (ref 39.0–52.0)
Hemoglobin: 14.8 g/dL (ref 13.0–17.0)
Immature Granulocytes: 1 %
Lymphocytes Relative: 10 %
Lymphs Abs: 0.8 10*3/uL (ref 0.7–4.0)
MCH: 31 pg (ref 26.0–34.0)
MCHC: 34.5 g/dL (ref 30.0–36.0)
MCV: 89.7 fL (ref 80.0–100.0)
Monocytes Absolute: 0.5 10*3/uL (ref 0.1–1.0)
Monocytes Relative: 7 %
Neutro Abs: 6.3 10*3/uL (ref 1.7–7.7)
Neutrophils Relative %: 82 %
Platelet Count: 87 10*3/uL — ABNORMAL LOW (ref 150–400)
RBC: 4.78 MIL/uL (ref 4.22–5.81)
RDW: 11.8 % (ref 11.5–15.5)
WBC Count: 7.7 10*3/uL (ref 4.0–10.5)
nRBC: 0 % (ref 0.0–0.2)

## 2019-12-04 LAB — PLATELET BY CITRATE

## 2019-12-11 ENCOUNTER — Inpatient Hospital Stay: Payer: 59

## 2019-12-11 ENCOUNTER — Encounter: Payer: Self-pay | Admitting: *Deleted

## 2019-12-11 DIAGNOSIS — R590 Localized enlarged lymph nodes: Secondary | ICD-10-CM | POA: Diagnosis not present

## 2019-12-11 DIAGNOSIS — D693 Immune thrombocytopenic purpura: Secondary | ICD-10-CM | POA: Diagnosis not present

## 2019-12-11 DIAGNOSIS — Z9081 Acquired absence of spleen: Secondary | ICD-10-CM | POA: Diagnosis not present

## 2019-12-11 DIAGNOSIS — Z7952 Long term (current) use of systemic steroids: Secondary | ICD-10-CM | POA: Diagnosis not present

## 2019-12-11 LAB — CBC WITH DIFFERENTIAL (CANCER CENTER ONLY)
Abs Immature Granulocytes: 0.02 10*3/uL (ref 0.00–0.07)
Basophils Absolute: 0 10*3/uL (ref 0.0–0.1)
Basophils Relative: 0 %
Eosinophils Absolute: 0.1 10*3/uL (ref 0.0–0.5)
Eosinophils Relative: 1 %
HCT: 47.9 % (ref 39.0–52.0)
Hemoglobin: 16.3 g/dL (ref 13.0–17.0)
Immature Granulocytes: 0 %
Lymphocytes Relative: 26 %
Lymphs Abs: 1.6 10*3/uL (ref 0.7–4.0)
MCH: 30.5 pg (ref 26.0–34.0)
MCHC: 34 g/dL (ref 30.0–36.0)
MCV: 89.7 fL (ref 80.0–100.0)
Monocytes Absolute: 0.6 10*3/uL (ref 0.1–1.0)
Monocytes Relative: 10 %
Neutro Abs: 4 10*3/uL (ref 1.7–7.7)
Neutrophils Relative %: 63 %
Platelet Count: 124 10*3/uL — ABNORMAL LOW (ref 150–400)
RBC: 5.34 MIL/uL (ref 4.22–5.81)
RDW: 11.8 % (ref 11.5–15.5)
WBC Count: 6.4 10*3/uL (ref 4.0–10.5)
nRBC: 0 % (ref 0.0–0.2)

## 2019-12-18 ENCOUNTER — Inpatient Hospital Stay: Payer: 59

## 2019-12-18 ENCOUNTER — Other Ambulatory Visit: Payer: Self-pay | Admitting: *Deleted

## 2019-12-18 ENCOUNTER — Other Ambulatory Visit: Payer: Self-pay

## 2019-12-18 DIAGNOSIS — D693 Immune thrombocytopenic purpura: Secondary | ICD-10-CM

## 2019-12-18 DIAGNOSIS — Z7952 Long term (current) use of systemic steroids: Secondary | ICD-10-CM | POA: Diagnosis not present

## 2019-12-18 DIAGNOSIS — Z9081 Acquired absence of spleen: Secondary | ICD-10-CM | POA: Diagnosis not present

## 2019-12-18 DIAGNOSIS — R590 Localized enlarged lymph nodes: Secondary | ICD-10-CM | POA: Diagnosis not present

## 2019-12-18 LAB — CBC WITH DIFFERENTIAL (CANCER CENTER ONLY)
Abs Immature Granulocytes: 0.01 10*3/uL (ref 0.00–0.07)
Basophils Absolute: 0 10*3/uL (ref 0.0–0.1)
Basophils Relative: 0 %
Eosinophils Absolute: 0.1 10*3/uL (ref 0.0–0.5)
Eosinophils Relative: 2 %
HCT: 43.7 % (ref 39.0–52.0)
Hemoglobin: 15 g/dL (ref 13.0–17.0)
Immature Granulocytes: 0 %
Lymphocytes Relative: 25 %
Lymphs Abs: 1.5 10*3/uL (ref 0.7–4.0)
MCH: 30.7 pg (ref 26.0–34.0)
MCHC: 34.3 g/dL (ref 30.0–36.0)
MCV: 89.5 fL (ref 80.0–100.0)
Monocytes Absolute: 0.7 10*3/uL (ref 0.1–1.0)
Monocytes Relative: 11 %
Neutro Abs: 3.6 10*3/uL (ref 1.7–7.7)
Neutrophils Relative %: 62 %
Platelet Count: 72 10*3/uL — ABNORMAL LOW (ref 150–400)
RBC: 4.88 MIL/uL (ref 4.22–5.81)
RDW: 11.8 % (ref 11.5–15.5)
WBC Count: 5.9 10*3/uL (ref 4.0–10.5)
nRBC: 0 % (ref 0.0–0.2)

## 2019-12-18 MED ORDER — DEXAMETHASONE 4 MG PO TABS: ORAL_TABLET | ORAL | 0 refills | Status: DC

## 2019-12-18 MED FILL — DEXAMETHASONE 4 MG TABLET: 4 | 4 days supply | Qty: 40 | Fill #0

## 2019-12-18 NOTE — Telephone Encounter (Signed)
I called patient and told him that Dr. Marin Olp would like for him to restart Decadron for four days. I E-scribed the prescription to [facility]. His dose is 40 mg po daily. He verbalized understanding and directions.

## 2019-12-26 ENCOUNTER — Other Ambulatory Visit: Payer: Self-pay | Admitting: *Deleted

## 2019-12-26 DIAGNOSIS — D693 Immune thrombocytopenic purpura: Secondary | ICD-10-CM

## 2019-12-27 ENCOUNTER — Encounter: Payer: Self-pay | Admitting: Hematology & Oncology

## 2019-12-27 ENCOUNTER — Inpatient Hospital Stay: Payer: 59

## 2019-12-27 ENCOUNTER — Inpatient Hospital Stay (HOSPITAL_BASED_OUTPATIENT_CLINIC_OR_DEPARTMENT_OTHER): Payer: 59 | Admitting: Hematology & Oncology

## 2019-12-27 ENCOUNTER — Other Ambulatory Visit: Payer: Self-pay | Admitting: *Deleted

## 2019-12-27 ENCOUNTER — Other Ambulatory Visit: Payer: Self-pay

## 2019-12-27 VITALS — BP 123/77 | HR 68 | Temp 98.7°F | Resp 20 | Wt 179.4 lb

## 2019-12-27 DIAGNOSIS — D693 Immune thrombocytopenic purpura: Secondary | ICD-10-CM | POA: Diagnosis not present

## 2019-12-27 DIAGNOSIS — Z7952 Long term (current) use of systemic steroids: Secondary | ICD-10-CM | POA: Diagnosis not present

## 2019-12-27 DIAGNOSIS — R590 Localized enlarged lymph nodes: Secondary | ICD-10-CM | POA: Diagnosis not present

## 2019-12-27 DIAGNOSIS — Z9081 Acquired absence of spleen: Secondary | ICD-10-CM | POA: Diagnosis not present

## 2019-12-27 LAB — CBC WITH DIFFERENTIAL (CANCER CENTER ONLY)
Abs Immature Granulocytes: 0.04 10*3/uL (ref 0.00–0.07)
Basophils Absolute: 0 10*3/uL (ref 0.0–0.1)
Basophils Relative: 0 %
Eosinophils Absolute: 0.2 10*3/uL (ref 0.0–0.5)
Eosinophils Relative: 3 %
HCT: 43.8 % (ref 39.0–52.0)
Hemoglobin: 15.1 g/dL (ref 13.0–17.0)
Immature Granulocytes: 1 %
Lymphocytes Relative: 25 %
Lymphs Abs: 1.6 10*3/uL (ref 0.7–4.0)
MCH: 30.4 pg (ref 26.0–34.0)
MCHC: 34.5 g/dL (ref 30.0–36.0)
MCV: 88.3 fL (ref 80.0–100.0)
Monocytes Absolute: 0.7 10*3/uL (ref 0.1–1.0)
Monocytes Relative: 11 %
Neutro Abs: 4 10*3/uL (ref 1.7–7.7)
Neutrophils Relative %: 60 %
Platelet Count: 115 10*3/uL — ABNORMAL LOW (ref 150–400)
RBC: 4.96 MIL/uL (ref 4.22–5.81)
RDW: 11.7 % (ref 11.5–15.5)
WBC Count: 6.5 10*3/uL (ref 4.0–10.5)
nRBC: 0 % (ref 0.0–0.2)

## 2019-12-27 LAB — CMP (CANCER CENTER ONLY)
ALT: 24 U/L (ref 0–44)
AST: 16 U/L (ref 15–41)
Albumin: 4.6 g/dL (ref 3.5–5.0)
Alkaline Phosphatase: 39 U/L (ref 38–126)
Anion gap: 7 (ref 5–15)
BUN: 28 mg/dL — ABNORMAL HIGH (ref 6–20)
CO2: 33 mmol/L — ABNORMAL HIGH (ref 22–32)
Calcium: 9.4 mg/dL (ref 8.9–10.3)
Chloride: 101 mmol/L (ref 98–111)
Creatinine: 1.26 mg/dL — ABNORMAL HIGH (ref 0.61–1.24)
GFR, Est AFR Am: >60 mL/min
GFR, Estimated: >60 mL/min
Glucose, Bld: 97 mg/dL (ref 70–99)
Potassium: 4.3 mmol/L (ref 3.5–5.1)
Sodium: 141 mmol/L (ref 135–145)
Total Bilirubin: 0.7 mg/dL (ref 0.3–1.2)
Total Protein: 6.3 g/dL — ABNORMAL LOW (ref 6.5–8.1)

## 2019-12-27 LAB — PLATELET BY CITRATE

## 2019-12-27 NOTE — Progress Notes (Signed)
Hematology and Oncology Follow Up Visit  MARCAL LIPPER JO:[id number] 11-11-1975 44 y.o. 12/27/2019   Principle Diagnosis:  Chronic immune thrombocytopenia  - Relapsed  Current Therapy:  Decadron 40 mg po q day x 4 days every 2 weeks - as needed Nplate - dose adjusted per pharmacy - as indicated     Interim History:  Mr. Taverna is here today for follow-up.  He actually is doing okay.  He had a relapse of the ITP back in early April.  We got him back on steroids.  He did well with the steroid.  He was on Decadron.  I think he had 2 cycles of the high-dose Decadron.  He has had no bleeding.  He has had no problems with bruises.  He has had no fever.  There is been no headache.  He has had no rashes.  There is been no leg swelling.  Overall, his performance status is ECOG 0.    Medications:  Allergies as of 12/27/2019      Reactions   Zyrtec [cetirizine]    BP elevated      Medication List       Accurate as of December 27, 2019  3:50 PM. If you have any questions, ask your nurse or doctor.        STOP taking these medications   dexamethasone 4 MG tablet Commonly known as: DECADRON Stopped by: Volanda Napoleon, MD       Allergies:  Allergies  Allergen Reactions  . Zyrtec [Cetirizine]     BP elevated    Past Medical History, Surgical history, Social history, and Family History were reviewed and updated.  Review of Systems: Review of Systems  Constitutional: Negative.   HENT: Negative.   Eyes: Negative.   Respiratory: Negative.   Cardiovascular: Negative.   Gastrointestinal: Negative.   Genitourinary: Negative.   Musculoskeletal: Negative.   Skin: Negative.   Neurological: Negative.   Endo/Heme/Allergies: Negative.   Psychiatric/Behavioral: Negative.      Physical Exam:  weight is 179 lb 6.4 oz (81.4 kg). His temporal temperature is 98.7 F (37.1 C). His blood pressure is 123/77 and his pulse is 68. His respiration is 20 and oxygen saturation is 99%.   Wt  Readings from Last 3 Encounters:  12/27/19 179 lb 6.4 oz (81.4 kg)  11/29/19 178 lb (80.7 kg)  06/28/19 178 lb (80.7 kg)    Physical Exam Vitals reviewed.  HENT:     Head: Normocephalic and atraumatic.  Eyes:     Pupils: Pupils are equal, round, and reactive to light.  Cardiovascular:     Rate and Rhythm: Normal rate and regular rhythm.     Heart sounds: Normal heart sounds.  Pulmonary:     Effort: Pulmonary effort is normal.     Breath sounds: Normal breath sounds.  Abdominal:     General: Bowel sounds are normal.     Palpations: Abdomen is soft.  Musculoskeletal:        General: No tenderness or deformity. Normal range of motion.     Cervical back: Normal range of motion.  Lymphadenopathy:     Cervical: No cervical adenopathy.  Skin:    General: Skin is warm and dry.     Findings: No erythema or rash.  Neurological:     Mental Status: He is alert and oriented to person, place, and time.  Psychiatric:        Behavior: Behavior normal.  Thought Content: Thought content normal.        Judgment: Judgment normal.     Lab Results  Component Value Date   WBC 6.5 12/27/2019   HGB 15.1 12/27/2019   HCT 43.8 12/27/2019   MCV 88.3 12/27/2019   PLT 115 (L) 12/27/2019   No results found for: FERRITIN, IRON, TIBC, UIBC, IRONPCTSAT Lab Results  Component Value Date   RBC 4.96 12/27/2019   No results found for: KPAFRELGTCHN, LAMBDASER, KAPLAMBRATIO No results found for: IGGSERUM, IGA, IGMSERUM No results found for: Odetta Pink, SPEI   Chemistry      Component Value Date/Time   NA 141 12/27/2019 1437   NA 142 07/01/2017 1246   K 4.3 12/27/2019 1437   K 4.1 07/01/2017 1246   CL 101 12/27/2019 1437   CL 94 (L) 09/06/2016 1502   CO2 33 (H) 12/27/2019 1437   CO2 30 (H) 07/01/2017 1246   BUN 28 (H) 12/27/2019 1437   BUN 12.4 07/01/2017 1246   CREATININE 1.26 (H) 12/27/2019 1437   CREATININE 1.22 09/21/2018  0910   CREATININE 1.1 07/01/2017 1246      Component Value Date/Time   CALCIUM 9.4 12/27/2019 1437   CALCIUM 9.1 07/01/2017 1246   ALKPHOS 39 12/27/2019 1437   ALKPHOS 61 07/01/2017 1246   AST 16 12/27/2019 1437   AST 19 07/01/2017 1246   ALT 24 12/27/2019 1437   ALT 13 07/01/2017 1246   BILITOT 0.7 12/27/2019 1437   BILITOT 0.66 07/01/2017 1246     Impression and Plan: Mr. Lapietra is a 44 year old white male. He has relapsed immune thrombocytopenia.  He was on a high-dose Decadron short course protocol.  I think he has had 2 cycles.  His platelet count has responded quite nicely.  I looked at his blood smear.  Again, the platelet count has come up nicely.  His platelets are well granulated.  I do not see any immature myeloid cells.  He has no nucleated red blood cells.  For right now, we will keep him off therapy.  We have not had to give him Nplate which I am happy about.  We will change his lab follow-up to every 2 weeks for right now.  I will plan to see him back in 6 weeks.    Volanda Napoleon, MD 4/29/20213:50 PM

## 2019-12-27 NOTE — Progress Notes (Signed)
platelet

## 2020-01-10 ENCOUNTER — Inpatient Hospital Stay: Payer: 59 | Attending: Hematology & Oncology

## 2020-01-10 ENCOUNTER — Other Ambulatory Visit: Payer: Self-pay

## 2020-01-10 DIAGNOSIS — D693 Immune thrombocytopenic purpura: Secondary | ICD-10-CM | POA: Diagnosis not present

## 2020-01-10 LAB — CBC WITH DIFFERENTIAL (CANCER CENTER ONLY)
Abs Immature Granulocytes: 0.01 10*3/uL (ref 0.00–0.07)
Basophils Absolute: 0 10*3/uL (ref 0.0–0.1)
Basophils Relative: 0 %
Eosinophils Absolute: 0.1 10*3/uL (ref 0.0–0.5)
Eosinophils Relative: 2 %
HCT: 39.7 % (ref 39.0–52.0)
Hemoglobin: 13.9 g/dL (ref 13.0–17.0)
Immature Granulocytes: 0 %
Lymphocytes Relative: 23 %
Lymphs Abs: 1.5 10*3/uL (ref 0.7–4.0)
MCH: 30.8 pg (ref 26.0–34.0)
MCHC: 35 g/dL (ref 30.0–36.0)
MCV: 88 fL (ref 80.0–100.0)
Monocytes Absolute: 0.5 10*3/uL (ref 0.1–1.0)
Monocytes Relative: 8 %
Neutro Abs: 4.4 10*3/uL (ref 1.7–7.7)
Neutrophils Relative %: 67 %
Platelet Count: 72 10*3/uL — ABNORMAL LOW (ref 150–400)
RBC: 4.51 MIL/uL (ref 4.22–5.81)
RDW: 11.9 % (ref 11.5–15.5)
WBC Count: 6.5 10*3/uL (ref 4.0–10.5)
nRBC: 0 % (ref 0.0–0.2)

## 2020-01-24 ENCOUNTER — Inpatient Hospital Stay: Payer: 59

## 2020-01-24 DIAGNOSIS — D693 Immune thrombocytopenic purpura: Secondary | ICD-10-CM | POA: Diagnosis not present

## 2020-01-24 LAB — CBC WITH DIFFERENTIAL (CANCER CENTER ONLY)
Abs Immature Granulocytes: 0.02 10*3/uL (ref 0.00–0.07)
Basophils Absolute: 0 10*3/uL (ref 0.0–0.1)
Basophils Relative: 0 %
Eosinophils Absolute: 0.3 10*3/uL (ref 0.0–0.5)
Eosinophils Relative: 5 %
HCT: 41.1 % (ref 39.0–52.0)
Hemoglobin: 14.3 g/dL (ref 13.0–17.0)
Immature Granulocytes: 0 %
Lymphocytes Relative: 27 %
Lymphs Abs: 1.6 10*3/uL (ref 0.7–4.0)
MCH: 30.4 pg (ref 26.0–34.0)
MCHC: 34.8 g/dL (ref 30.0–36.0)
MCV: 87.4 fL (ref 80.0–100.0)
Monocytes Absolute: 0.6 10*3/uL (ref 0.1–1.0)
Monocytes Relative: 10 %
Neutro Abs: 3.4 10*3/uL (ref 1.7–7.7)
Neutrophils Relative %: 58 %
Platelet Count: 42 10*3/uL — ABNORMAL LOW (ref 150–400)
RBC: 4.7 MIL/uL (ref 4.22–5.81)
RDW: 11.8 % (ref 11.5–15.5)
WBC Count: 6 10*3/uL (ref 4.0–10.5)
nRBC: 0 % (ref 0.0–0.2)

## 2020-01-29 ENCOUNTER — Encounter: Payer: Self-pay | Admitting: Hematology & Oncology

## 2020-01-29 ENCOUNTER — Other Ambulatory Visit: Payer: Self-pay | Admitting: Hematology & Oncology

## 2020-01-29 MED ORDER — DEXAMETHASONE 4 MG PO TABS: 40.0000 mg | ORAL_TABLET | Freq: Every day | ORAL | 4 refills | Status: AC

## 2020-01-29 MED FILL — DEXAMETHASONE 4 MG TABLET: 4 | 10 days supply | Qty: 56 | Fill #0

## 2020-01-30 ENCOUNTER — Telehealth: Payer: Self-pay | Admitting: Hematology & Oncology

## 2020-01-30 NOTE — Telephone Encounter (Signed)
Per 6/1 staff message I have forward this to [facility] at [facility] to [facility] for scheduling

## 2020-01-31 ENCOUNTER — Inpatient Hospital Stay: Payer: 59 | Attending: Hematology & Oncology

## 2020-01-31 ENCOUNTER — Other Ambulatory Visit: Payer: Self-pay

## 2020-01-31 ENCOUNTER — Inpatient Hospital Stay: Payer: 59

## 2020-01-31 VITALS — BP 124/80 | Temp 98.2°F

## 2020-01-31 DIAGNOSIS — Z79899 Other long term (current) drug therapy: Secondary | ICD-10-CM | POA: Insufficient documentation

## 2020-01-31 DIAGNOSIS — D693 Immune thrombocytopenic purpura: Secondary | ICD-10-CM | POA: Insufficient documentation

## 2020-01-31 LAB — CBC WITH DIFFERENTIAL (CANCER CENTER ONLY)
Abs Immature Granulocytes: 0.14 10*3/uL — ABNORMAL HIGH (ref 0.00–0.07)
Basophils Absolute: 0 10*3/uL (ref 0.0–0.1)
Basophils Relative: 0 %
Eosinophils Absolute: 0 10*3/uL (ref 0.0–0.5)
Eosinophils Relative: 0 %
HCT: 41.8 % (ref 39.0–52.0)
Hemoglobin: 14.6 g/dL (ref 13.0–17.0)
Immature Granulocytes: 1 %
Lymphocytes Relative: 8 %
Lymphs Abs: 1.6 10*3/uL (ref 0.7–4.0)
MCH: 31.1 pg (ref 26.0–34.0)
MCHC: 34.9 g/dL (ref 30.0–36.0)
MCV: 88.9 fL (ref 80.0–100.0)
Monocytes Absolute: 1.4 10*3/uL — ABNORMAL HIGH (ref 0.1–1.0)
Monocytes Relative: 7 %
Neutro Abs: 16.3 10*3/uL — ABNORMAL HIGH (ref 1.7–7.7)
Neutrophils Relative %: 84 %
Platelet Count: 81 10*3/uL — ABNORMAL LOW (ref 150–400)
RBC: 4.7 MIL/uL (ref 4.22–5.81)
RDW: 11.9 % (ref 11.5–15.5)
WBC Count: 19.4 10*3/uL — ABNORMAL HIGH (ref 4.0–10.5)
nRBC: 0 % (ref 0.0–0.2)

## 2020-01-31 MED ORDER — ROMIPLOSTIM 125 MCG ~~LOC~~ SOLR
80.0000 ug | SUBCUTANEOUS | Status: DC
  Administered 2020-01-31: 80 ug via SUBCUTANEOUS
  Filled 2020-01-31: qty 0.16

## 2020-01-31 NOTE — Progress Notes (Signed)
No prior auth needed per PA team

## 2020-01-31 NOTE — Patient Instructions (Signed)
Romiplostim injection What is this medicine? ROMIPLOSTIM (roe mi PLOE stim) helps your body make more platelets. This medicine is used to treat low platelets caused by chronic idiopathic thrombocytopenic purpura (ITP). This medicine may be used for other purposes; ask your health care provider or pharmacist if you have questions. COMMON BRAND NAME(S): Nplate What should I tell my health care provider before I take this medicine? They need to know if you have any of these conditions:  bleeding disorders  bone marrow problem, like blood cancer or myelodysplastic syndrome  history of blood clots  liver disease  surgery to remove your spleen  an unusual or allergic reaction to romiplostim, mannitol, other medicines, foods, dyes, or preservatives  pregnant or trying to get pregnant  breast-feeding How should I use this medicine? This medicine is for injection under the skin. It is given by a health care professional in a hospital or clinic setting. A special MedGuide will be given to you before your injection. Read this information carefully each time. Talk to your pediatrician regarding the use of this medicine in children. While this drug may be prescribed for children as young as 1 year for selected conditions, precautions do apply. Overdosage: If you think you have taken too much of this medicine contact a poison control center or emergency room at once. NOTE: This medicine is only for you. Do not share this medicine with others. What if I miss a dose? It is important not to miss your dose. Call your doctor or health care professional if you are unable to keep an appointment. What may interact with this medicine? Interactions are not expected. This list may not describe all possible interactions. Give your health care provider a list of all the medicines, herbs, non-prescription drugs, or dietary supplements you use. Also tell them if you smoke, drink alcohol, or use illegal drugs.  Some items may interact with your medicine. What should I watch for while using this medicine? Your condition will be monitored carefully while you are receiving this medicine. Visit your prescriber or health care professional for regular checks on your progress and for the needed blood tests. It is important to keep all appointments. What side effects may I notice from receiving this medicine? Side effects that you should report to your doctor or health care professional as soon as possible:  allergic reactions like skin rash, itching or hives, swelling of the face, lips, or tongue  signs and symptoms of bleeding such as bloody or black, tarry stools; red or dark brown urine; spitting up blood or brown material that looks like coffee grounds; red spots on the skin; unusual bruising or bleeding from the eyes, gums, or nose  signs and symptoms of a blood clot such as chest pain; shortness of breath; pain, swelling, or warmth in the leg  signs and symptoms of a stroke like changes in vision; confusion; trouble speaking or understanding; severe headaches; sudden numbness or weakness of the face, arm or leg; trouble walking; dizziness; loss of balance or coordination Side effects that usually do not require medical attention (report to your doctor or health care professional if they continue or are bothersome):  headache  pain in arms and legs  pain in mouth  stomach pain This list may not describe all possible side effects. Call your doctor for medical advice about side effects. You may report side effects to FDA at 1-800-FDA-1088. Where should I keep my medicine? This drug is given in a hospital or clinic   and will not be stored at home. NOTE: This sheet is a summary. It may not cover all possible information. If you have questions about this medicine, talk to your doctor, pharmacist, or health care provider.  2020 Elsevier/Gold Standard (2017-08-15 11:10:55)  

## 2020-02-04 ENCOUNTER — Other Ambulatory Visit: Payer: Self-pay

## 2020-02-04 ENCOUNTER — Inpatient Hospital Stay (HOSPITAL_BASED_OUTPATIENT_CLINIC_OR_DEPARTMENT_OTHER): Payer: 59 | Admitting: Hematology & Oncology

## 2020-02-04 ENCOUNTER — Inpatient Hospital Stay: Payer: 59

## 2020-02-04 ENCOUNTER — Encounter: Payer: Self-pay | Admitting: Hematology & Oncology

## 2020-02-04 VITALS — BP 137/78 | HR 57 | Temp 97.3°F | Resp 18 | Ht 68.9 in | Wt 182.8 lb

## 2020-02-04 DIAGNOSIS — Z79899 Other long term (current) drug therapy: Secondary | ICD-10-CM | POA: Diagnosis not present

## 2020-02-04 DIAGNOSIS — D693 Immune thrombocytopenic purpura: Secondary | ICD-10-CM

## 2020-02-04 LAB — CBC WITH DIFFERENTIAL (CANCER CENTER ONLY)
Abs Immature Granulocytes: 0.1 10*3/uL — ABNORMAL HIGH (ref 0.00–0.07)
Basophils Absolute: 0 10*3/uL (ref 0.0–0.1)
Basophils Relative: 0 %
Eosinophils Absolute: 0 10*3/uL (ref 0.0–0.5)
Eosinophils Relative: 0 %
HCT: 43.1 % (ref 39.0–52.0)
Hemoglobin: 14.9 g/dL (ref 13.0–17.0)
Immature Granulocytes: 1 %
Lymphocytes Relative: 10 %
Lymphs Abs: 0.9 10*3/uL (ref 0.7–4.0)
MCH: 30.8 pg (ref 26.0–34.0)
MCHC: 34.6 g/dL (ref 30.0–36.0)
MCV: 89.2 fL (ref 80.0–100.0)
Monocytes Absolute: 0.4 10*3/uL (ref 0.1–1.0)
Monocytes Relative: 5 %
Neutro Abs: 7.5 10*3/uL (ref 1.7–7.7)
Neutrophils Relative %: 84 %
Platelet Count: 204 10*3/uL (ref 150–400)
RBC: 4.83 MIL/uL (ref 4.22–5.81)
RDW: 11.8 % (ref 11.5–15.5)
WBC Count: 9 10*3/uL (ref 4.0–10.5)
nRBC: 0 % (ref 0.0–0.2)

## 2020-02-04 LAB — CMP (CANCER CENTER ONLY)
ALT: 17 U/L (ref 0–44)
AST: 13 U/L — ABNORMAL LOW (ref 15–41)
Albumin: 4.6 g/dL (ref 3.5–5.0)
Alkaline Phosphatase: 38 U/L (ref 38–126)
Anion gap: 8 (ref 5–15)
BUN: 31 mg/dL — ABNORMAL HIGH (ref 6–20)
CO2: 30 mmol/L (ref 22–32)
Calcium: 9.2 mg/dL (ref 8.9–10.3)
Chloride: 100 mmol/L (ref 98–111)
Creatinine: 1.31 mg/dL — ABNORMAL HIGH (ref 0.61–1.24)
GFR, Est AFR Am: >60 mL/min (ref >60)
GFR, Estimated: >60 mL/min (ref >60)
Glucose, Bld: 119 mg/dL — ABNORMAL HIGH (ref 70–99)
Potassium: 4 mmol/L (ref 3.5–5.1)
Sodium: 138 mmol/L (ref 135–145)
Total Bilirubin: 0.7 mg/dL (ref 0.3–1.2)
Total Protein: 6.3 g/dL — ABNORMAL LOW (ref 6.5–8.1)

## 2020-02-04 LAB — PLATELET BY CITRATE

## 2020-02-04 LAB — SAVE SMEAR(SSMR), FOR PROVIDER SLIDE REVIEW

## 2020-02-04 NOTE — Progress Notes (Signed)
Hematology and Oncology Follow Up Visit  Robert Young [id number] [birth year] 43 y.o. 02/04/2020   Principle Diagnosis:  Chronic immune thrombocytopenia  - Relapsed  Current Therapy:  Decadron 40 mg po q day x 4 days every 2 weeks - as needed Nplate - dose adjusted per pharmacy - as indicated     Interim History:  Robert Young is here today for follow-up.  We had to start him back on Decadron and Nplate.  His platelet count was down to 42,000.  As expected, he is responding quite nicely.  His platelet count today is 204,000.  I think he is only had 1 dose of the Nplate.  He feels okay.  The Decadron did not bother him all that much.  He is on a Decadron taper and this helps with him from having a "crash" after the Decadron.  He is still working.  He has had no problems with bleeding.  He has had no problems with nausea or vomiting.  Is been no cough.  Is had no dyspepsia.  He has had no change in bowel or bladder habits.  There is been no leg swelling.  He has had no headache.  He has had no bruising.  As always overall, his performance status is ECOG 0.    Medications:  Allergies as of 02/04/2020      Reactions   Zyrtec [cetirizine] Hypertension      Medication List    as of February 04, 2020  1:29 PM   You have not been prescribed any medications.     Allergies:  Allergies  Allergen Reactions  . Zyrtec [Cetirizine] Hypertension    Past Medical History, Surgical history, Social history, and Family History were reviewed and updated.  Review of Systems: Review of Systems  Constitutional: Negative.   HENT: Negative.   Eyes: Negative.   Respiratory: Negative.   Cardiovascular: Negative.   Gastrointestinal: Negative.   Genitourinary: Negative.   Musculoskeletal: Negative.   Skin: Negative.   Neurological: Negative.   Endo/Heme/Allergies: Negative.   Psychiatric/Behavioral: Negative.      Physical Exam:  height is 5' 8.9" (1.75 m) and weight is 182 lb 12.8 oz (82.9  kg). His temporal temperature is 97.3 F (36.3 C) (abnormal). His blood pressure is 137/78 and his pulse is 57 (abnormal). His respiration is 18 and oxygen saturation is 98%.   Wt Readings from Last 3 Encounters:  02/04/20 182 lb 12.8 oz (82.9 kg)  12/27/19 179 lb 6.4 oz (81.4 kg)  11/29/19 178 lb (80.7 kg)    Physical Exam Vitals reviewed.  HENT:     Head: Normocephalic and atraumatic.  Eyes:     Pupils: Pupils are equal, round, and reactive to light.  Cardiovascular:     Rate and Rhythm: Normal rate and regular rhythm.     Heart sounds: Normal heart sounds.  Pulmonary:     Effort: Pulmonary effort is normal.     Breath sounds: Normal breath sounds.  Abdominal:     General: Bowel sounds are normal.     Palpations: Abdomen is soft.  Musculoskeletal:        General: No tenderness or deformity. Normal range of motion.     Cervical back: Normal range of motion.  Lymphadenopathy:     Cervical: No cervical adenopathy.  Skin:    General: Skin is warm and dry.     Findings: No erythema or rash.  Neurological:     Mental Status: He is alert and  oriented to person, place, and time.  Psychiatric:        Behavior: Behavior normal.        Thought Content: Thought content normal.        Judgment: Judgment normal.     Lab Results  Component Value Date   WBC 9.0 02/04/2020   HGB 14.9 02/04/2020   HCT 43.1 02/04/2020   MCV 89.2 02/04/2020   PLT 204 02/04/2020   No results found for: FERRITIN, IRON, TIBC, UIBC, IRONPCTSAT Lab Results  Component Value Date   RBC 4.83 02/04/2020   No results found for: KPAFRELGTCHN, LAMBDASER, KAPLAMBRATIO No results found for: IGGSERUM, IGA, IGMSERUM No results found for: Odetta Pink, SPEI   Chemistry      Component Value Date/Time   NA 138 02/04/2020 1250   NA 142 07/01/2017 1246   K 4.0 02/04/2020 1250   K 4.1 07/01/2017 1246   CL 100 02/04/2020 1250   CL 94 (L) 09/06/2016 1502    CO2 30 02/04/2020 1250   CO2 30 (H) 07/01/2017 1246   BUN 31 (H) 02/04/2020 1250   BUN 12.4 07/01/2017 1246   CREATININE 1.31 (H) 02/04/2020 1250   CREATININE 1.22 09/21/2018 0910   CREATININE 1.1 07/01/2017 1246      Component Value Date/Time   CALCIUM 9.2 02/04/2020 1250   CALCIUM 9.1 07/01/2017 1246   ALKPHOS 38 02/04/2020 1250   ALKPHOS 61 07/01/2017 1246   AST 13 (L) 02/04/2020 1250   AST 19 07/01/2017 1246   ALT 17 02/04/2020 1250   ALT 13 07/01/2017 1246   BILITOT 0.7 02/04/2020 1250   BILITOT 0.66 07/01/2017 1246     Impression and Plan: Robert Young is a 44 year old white male. He has relapsed immune thrombocytopenia.  He really has not had a relapse for a year or so.  As such, I knew that he would respond to the Decadron and Nplate.  I think that we can probably try to switch him to every other week Nplate and labs.  This will make his life a little bit easier.  With the summertime coming up, he and his family will be on vacation.  We can certainly adjust his schedule to accommodate his vacation plans.  I will plan to do his next lab and Nplate on June 17.  From then, we will go every other week.  I will plan to see him back the end of July.     Volanda Napoleon, MD 6/7/20211:29 PM

## 2020-02-14 ENCOUNTER — Ambulatory Visit: Payer: 59

## 2020-02-14 ENCOUNTER — Other Ambulatory Visit: Payer: 59

## 2020-02-21 ENCOUNTER — Telehealth: Payer: Self-pay | Admitting: *Deleted

## 2020-02-21 ENCOUNTER — Inpatient Hospital Stay: Payer: 59

## 2020-02-21 ENCOUNTER — Other Ambulatory Visit: Payer: Self-pay

## 2020-02-21 VITALS — BP 132/72 | HR 70

## 2020-02-21 DIAGNOSIS — D693 Immune thrombocytopenic purpura: Secondary | ICD-10-CM | POA: Diagnosis not present

## 2020-02-21 DIAGNOSIS — Z79899 Other long term (current) drug therapy: Secondary | ICD-10-CM | POA: Diagnosis not present

## 2020-02-21 LAB — CBC WITH DIFFERENTIAL (CANCER CENTER ONLY)
Abs Immature Granulocytes: 0.01 10*3/uL (ref 0.00–0.07)
Basophils Absolute: 0 10*3/uL (ref 0.0–0.1)
Basophils Relative: 0 %
Eosinophils Absolute: 0.3 10*3/uL (ref 0.0–0.5)
Eosinophils Relative: 5 %
HCT: 41.8 % (ref 39.0–52.0)
Hemoglobin: 14.4 g/dL (ref 13.0–17.0)
Immature Granulocytes: 0 %
Lymphocytes Relative: 25 %
Lymphs Abs: 1.4 10*3/uL (ref 0.7–4.0)
MCH: 30.9 pg (ref 26.0–34.0)
MCHC: 34.4 g/dL (ref 30.0–36.0)
MCV: 89.7 fL (ref 80.0–100.0)
Monocytes Absolute: 0.5 10*3/uL (ref 0.1–1.0)
Monocytes Relative: 8 %
Neutro Abs: 3.5 10*3/uL (ref 1.7–7.7)
Neutrophils Relative %: 62 %
Platelet Count: 119 10*3/uL — ABNORMAL LOW (ref 150–400)
RBC: 4.66 MIL/uL (ref 4.22–5.81)
RDW: 11.9 % (ref 11.5–15.5)
WBC Count: 5.7 10*3/uL (ref 4.0–10.5)
nRBC: 0 % (ref 0.0–0.2)

## 2020-02-21 LAB — CMP (CANCER CENTER ONLY)
ALT: 22 U/L (ref 0–44)
AST: 20 U/L (ref 15–41)
Albumin: 4.6 g/dL (ref 3.5–5.0)
Alkaline Phosphatase: 49 U/L (ref 38–126)
Anion gap: 12 (ref 5–15)
BUN: 21 mg/dL — ABNORMAL HIGH (ref 6–20)
CO2: 24 mmol/L (ref 22–32)
Calcium: 9.2 mg/dL (ref 8.9–10.3)
Chloride: 106 mmol/L (ref 98–111)
Creatinine: 1.3 mg/dL — ABNORMAL HIGH (ref 0.61–1.24)
GFR, Est AFR Am: >60 mL/min (ref >60)
GFR, Estimated: >60 mL/min (ref >60)
Glucose, Bld: 89 mg/dL (ref 70–99)
Potassium: 3.9 mmol/L (ref 3.5–5.1)
Sodium: 142 mmol/L (ref 135–145)
Total Bilirubin: 1.4 mg/dL — ABNORMAL HIGH (ref 0.3–1.2)
Total Protein: 6.5 g/dL (ref 6.5–8.1)

## 2020-02-21 LAB — PLATELET BY CITRATE

## 2020-02-21 LAB — SAVE SMEAR(SSMR), FOR PROVIDER SLIDE REVIEW

## 2020-02-21 MED ORDER — ROMIPLOSTIM 125 MCG ~~LOC~~ SOLR
80.0000 ug | SUBCUTANEOUS | Status: DC
  Administered 2020-02-21: 80 ug via SUBCUTANEOUS
  Filled 2020-02-21: qty 0.16

## 2020-02-21 NOTE — Patient Instructions (Signed)
Romiplostim injection What is this medicine? ROMIPLOSTIM (roe mi PLOE stim) helps your body make more platelets. This medicine is used to treat low platelets caused by chronic idiopathic thrombocytopenic purpura (ITP). This medicine may be used for other purposes; ask your health care provider or pharmacist if you have questions. COMMON BRAND NAME(S): Nplate What should I tell my health care provider before I take this medicine? They need to know if you have any of these conditions:  bleeding disorders  bone marrow problem, like blood cancer or myelodysplastic syndrome  history of blood clots  liver disease  surgery to remove your spleen  an unusual or allergic reaction to romiplostim, mannitol, other medicines, foods, dyes, or preservatives  pregnant or trying to get pregnant  breast-feeding How should I use this medicine? This medicine is for injection under the skin. It is given by a health care professional in a hospital or clinic setting. A special MedGuide will be given to you before your injection. Read this information carefully each time. Talk to your pediatrician regarding the use of this medicine in children. While this drug may be prescribed for children as young as 1 year for selected conditions, precautions do apply. Overdosage: If you think you have taken too much of this medicine contact a poison control center or emergency room at once. NOTE: This medicine is only for you. Do not share this medicine with others. What if I miss a dose? It is important not to miss your dose. Call your doctor or health care professional if you are unable to keep an appointment. What may interact with this medicine? Interactions are not expected. This list may not describe all possible interactions. Give your health care provider a list of all the medicines, herbs, non-prescription drugs, or dietary supplements you use. Also tell them if you smoke, drink alcohol, or use illegal drugs.  Some items may interact with your medicine. What should I watch for while using this medicine? Your condition will be monitored carefully while you are receiving this medicine. Visit your prescriber or health care professional for regular checks on your progress and for the needed blood tests. It is important to keep all appointments. What side effects may I notice from receiving this medicine? Side effects that you should report to your doctor or health care professional as soon as possible:  allergic reactions like skin rash, itching or hives, swelling of the face, lips, or tongue  signs and symptoms of bleeding such as bloody or black, tarry stools; red or dark brown urine; spitting up blood or brown material that looks like coffee grounds; red spots on the skin; unusual bruising or bleeding from the eyes, gums, or nose  signs and symptoms of a blood clot such as chest pain; shortness of breath; pain, swelling, or warmth in the leg  signs and symptoms of a stroke like changes in vision; confusion; trouble speaking or understanding; severe headaches; sudden numbness or weakness of the face, arm or leg; trouble walking; dizziness; loss of balance or coordination Side effects that usually do not require medical attention (report to your doctor or health care professional if they continue or are bothersome):  headache  pain in arms and legs  pain in mouth  stomach pain This list may not describe all possible side effects. Call your doctor for medical advice about side effects. You may report side effects to FDA at 1-800-FDA-1088. Where should I keep my medicine? This drug is given in a hospital or clinic   and will not be stored at home. NOTE: This sheet is a summary. It may not cover all possible information. If you have questions about this medicine, talk to your doctor, pharmacist, or health care provider.  2020 Elsevier/Gold Standard (2017-08-15 11:10:55)  

## 2020-02-21 NOTE — Telephone Encounter (Signed)
-----   Message from Robert Napoleon, MD sent at 02/21/2020  2:08 PM EDT ----- Call - platelet count is down a little.  Still ok, but will need to watch carefully.  Robert Young

## 2020-02-21 NOTE — Telephone Encounter (Signed)
Pt notified per order of Dr. Marin Olp that the "platelet count is down a little.  Still ok, but will need to watch carefuly.  Pete"  Pt appreciative of call and has no questions or concerns at this time.

## 2020-02-28 ENCOUNTER — Ambulatory Visit: Payer: 59

## 2020-02-28 ENCOUNTER — Other Ambulatory Visit: Payer: 59

## 2020-03-06 ENCOUNTER — Inpatient Hospital Stay: Payer: 59

## 2020-03-06 ENCOUNTER — Other Ambulatory Visit: Payer: Self-pay

## 2020-03-06 ENCOUNTER — Inpatient Hospital Stay: Payer: 59 | Attending: Hematology & Oncology

## 2020-03-06 VITALS — BP 137/78 | HR 70 | Temp 97.3°F | Resp 18

## 2020-03-06 DIAGNOSIS — D693 Immune thrombocytopenic purpura: Secondary | ICD-10-CM

## 2020-03-06 DIAGNOSIS — Z79899 Other long term (current) drug therapy: Secondary | ICD-10-CM | POA: Diagnosis not present

## 2020-03-06 LAB — CBC WITH DIFFERENTIAL (CANCER CENTER ONLY)
Abs Immature Granulocytes: 0.02 10*3/uL (ref 0.00–0.07)
Basophils Absolute: 0 10*3/uL (ref 0.0–0.1)
Basophils Relative: 0 %
Eosinophils Absolute: 0.2 10*3/uL (ref 0.0–0.5)
Eosinophils Relative: 3 %
HCT: 43.5 % (ref 39.0–52.0)
Hemoglobin: 14.9 g/dL (ref 13.0–17.0)
Immature Granulocytes: 0 %
Lymphocytes Relative: 18 %
Lymphs Abs: 1.3 10*3/uL (ref 0.7–4.0)
MCH: 31 pg (ref 26.0–34.0)
MCHC: 34.3 g/dL (ref 30.0–36.0)
MCV: 90.4 fL (ref 80.0–100.0)
Monocytes Absolute: 0.6 10*3/uL (ref 0.1–1.0)
Monocytes Relative: 8 %
Neutro Abs: 5.4 10*3/uL (ref 1.7–7.7)
Neutrophils Relative %: 71 %
Platelet Count: 285 10*3/uL (ref 150–400)
RBC: 4.81 MIL/uL (ref 4.22–5.81)
RDW: 11.9 % (ref 11.5–15.5)
WBC Count: 7.6 10*3/uL (ref 4.0–10.5)
nRBC: 0 % (ref 0.0–0.2)

## 2020-03-06 MED ORDER — ROMIPLOSTIM 125 MCG ~~LOC~~ SOLR: 1.0000 ug/kg | SUBCUTANEOUS | Status: DC

## 2020-03-06 NOTE — Progress Notes (Signed)
Per MD Ennever, no Nplate today

## 2020-03-06 NOTE — Progress Notes (Signed)
Pltc = 285 today No Nplate today per Dr. Marin Olp.  Kennith Center, Pharm.D., CPP 03/06/2020@12 :11 PM

## 2020-03-13 ENCOUNTER — Ambulatory Visit: Payer: 59

## 2020-03-13 ENCOUNTER — Other Ambulatory Visit: Payer: 59

## 2020-03-20 ENCOUNTER — Inpatient Hospital Stay: Payer: 59

## 2020-03-20 ENCOUNTER — Other Ambulatory Visit: Payer: Self-pay

## 2020-03-20 VITALS — BP 135/84 | HR 57 | Temp 98.4°F | Resp 18

## 2020-03-20 DIAGNOSIS — D693 Immune thrombocytopenic purpura: Secondary | ICD-10-CM

## 2020-03-20 DIAGNOSIS — Z79899 Other long term (current) drug therapy: Secondary | ICD-10-CM | POA: Diagnosis not present

## 2020-03-20 LAB — CBC WITH DIFFERENTIAL (CANCER CENTER ONLY)
Abs Immature Granulocytes: 0.01 10*3/uL (ref 0.00–0.07)
Basophils Absolute: 0 10*3/uL (ref 0.0–0.1)
Basophils Relative: 0 %
Eosinophils Absolute: 0.6 10*3/uL — ABNORMAL HIGH (ref 0.0–0.5)
Eosinophils Relative: 8 %
HCT: 42.1 % (ref 39.0–52.0)
Hemoglobin: 14.6 g/dL (ref 13.0–17.0)
Immature Granulocytes: 0 %
Lymphocytes Relative: 21 %
Lymphs Abs: 1.6 10*3/uL (ref 0.7–4.0)
MCH: 31.1 pg (ref 26.0–34.0)
MCHC: 34.7 g/dL (ref 30.0–36.0)
MCV: 89.6 fL (ref 80.0–100.0)
Monocytes Absolute: 0.6 10*3/uL (ref 0.1–1.0)
Monocytes Relative: 8 %
Neutro Abs: 4.7 10*3/uL (ref 1.7–7.7)
Neutrophils Relative %: 63 %
Platelet Count: 141 10*3/uL — ABNORMAL LOW (ref 150–400)
RBC: 4.7 MIL/uL (ref 4.22–5.81)
RDW: 11.7 % (ref 11.5–15.5)
WBC Count: 7.6 10*3/uL (ref 4.0–10.5)
nRBC: 0 % (ref 0.0–0.2)

## 2020-03-20 MED ORDER — ROMIPLOSTIM 125 MCG ~~LOC~~ SOLR
80.0000 ug | SUBCUTANEOUS | Status: DC
  Administered 2020-03-20: 80 ug via SUBCUTANEOUS
  Filled 2020-03-20: qty 0.16

## 2020-03-20 NOTE — Patient Instructions (Signed)
Romiplostim injection What is this medicine? ROMIPLOSTIM (roe mi PLOE stim) helps your body make more platelets. This medicine is used to treat low platelets caused by chronic idiopathic thrombocytopenic purpura (ITP). This medicine may be used for other purposes; ask your health care provider or pharmacist if you have questions. COMMON BRAND NAME(S): Nplate What should I tell my health care provider before I take this medicine? They need to know if you have any of these conditions:  bleeding disorders  bone marrow problem, like blood cancer or myelodysplastic syndrome  history of blood clots  liver disease  surgery to remove your spleen  an unusual or allergic reaction to romiplostim, mannitol, other medicines, foods, dyes, or preservatives  pregnant or trying to get pregnant  breast-feeding How should I use this medicine? This medicine is for injection under the skin. It is given by a health care professional in a hospital or clinic setting. A special MedGuide will be given to you before your injection. Read this information carefully each time. Talk to your pediatrician regarding the use of this medicine in children. While this drug may be prescribed for children as young as 1 year for selected conditions, precautions do apply. Overdosage: If you think you have taken too much of this medicine contact a poison control center or emergency room at once. NOTE: This medicine is only for you. Do not share this medicine with others. What if I miss a dose? It is important not to miss your dose. Call your doctor or health care professional if you are unable to keep an appointment. What may interact with this medicine? Interactions are not expected. This list may not describe all possible interactions. Give your health care provider a list of all the medicines, herbs, non-prescription drugs, or dietary supplements you use. Also tell them if you smoke, drink alcohol, or use illegal drugs.  Some items may interact with your medicine. What should I watch for while using this medicine? Your condition will be monitored carefully while you are receiving this medicine. Visit your prescriber or health care professional for regular checks on your progress and for the needed blood tests. It is important to keep all appointments. What side effects may I notice from receiving this medicine? Side effects that you should report to your doctor or health care professional as soon as possible:  allergic reactions like skin rash, itching or hives, swelling of the face, lips, or tongue  signs and symptoms of bleeding such as bloody or black, tarry stools; red or dark brown urine; spitting up blood or brown material that looks like coffee grounds; red spots on the skin; unusual bruising or bleeding from the eyes, gums, or nose  signs and symptoms of a blood clot such as chest pain; shortness of breath; pain, swelling, or warmth in the leg  signs and symptoms of a stroke like changes in vision; confusion; trouble speaking or understanding; severe headaches; sudden numbness or weakness of the face, arm or leg; trouble walking; dizziness; loss of balance or coordination Side effects that usually do not require medical attention (report to your doctor or health care professional if they continue or are bothersome):  headache  pain in arms and legs  pain in mouth  stomach pain This list may not describe all possible side effects. Call your doctor for medical advice about side effects. You may report side effects to FDA at 1-800-FDA-1088. Where should I keep my medicine? This drug is given in a hospital or clinic   and will not be stored at home. NOTE: This sheet is a summary. It may not cover all possible information. If you have questions about this medicine, talk to your doctor, pharmacist, or health care provider.  2020 Elsevier/Gold Standard (2017-08-15 11:10:55)  

## 2020-03-26 ENCOUNTER — Other Ambulatory Visit: Payer: Self-pay | Admitting: *Deleted

## 2020-03-26 DIAGNOSIS — D693 Immune thrombocytopenic purpura: Secondary | ICD-10-CM

## 2020-03-27 ENCOUNTER — Other Ambulatory Visit: Payer: 59

## 2020-03-27 ENCOUNTER — Inpatient Hospital Stay (HOSPITAL_BASED_OUTPATIENT_CLINIC_OR_DEPARTMENT_OTHER): Payer: 59 | Admitting: Hematology & Oncology

## 2020-03-27 ENCOUNTER — Other Ambulatory Visit: Payer: Self-pay

## 2020-03-27 ENCOUNTER — Encounter: Payer: Self-pay | Admitting: Hematology & Oncology

## 2020-03-27 ENCOUNTER — Ambulatory Visit: Payer: 59

## 2020-03-27 ENCOUNTER — Inpatient Hospital Stay: Payer: 59

## 2020-03-27 VITALS — BP 125/77 | HR 53 | Temp 98.9°F | Resp 16 | Wt 182.0 lb

## 2020-03-27 DIAGNOSIS — D693 Immune thrombocytopenic purpura: Secondary | ICD-10-CM | POA: Diagnosis not present

## 2020-03-27 DIAGNOSIS — Z79899 Other long term (current) drug therapy: Secondary | ICD-10-CM | POA: Diagnosis not present

## 2020-03-27 LAB — CBC WITH DIFFERENTIAL (CANCER CENTER ONLY)
Abs Immature Granulocytes: 0.01 10*3/uL (ref 0.00–0.07)
Basophils Absolute: 0 10*3/uL (ref 0.0–0.1)
Basophils Relative: 0 %
Eosinophils Absolute: 0.6 10*3/uL — ABNORMAL HIGH (ref 0.0–0.5)
Eosinophils Relative: 10 %
HCT: 42 % (ref 39.0–52.0)
Hemoglobin: 14.5 g/dL (ref 13.0–17.0)
Immature Granulocytes: 0 %
Lymphocytes Relative: 26 %
Lymphs Abs: 1.6 10*3/uL (ref 0.7–4.0)
MCH: 30.8 pg (ref 26.0–34.0)
MCHC: 34.5 g/dL (ref 30.0–36.0)
MCV: 89.2 fL (ref 80.0–100.0)
Monocytes Absolute: 0.5 10*3/uL (ref 0.1–1.0)
Monocytes Relative: 8 %
Neutro Abs: 3.3 10*3/uL (ref 1.7–7.7)
Neutrophils Relative %: 56 %
Platelet Count: 164 10*3/uL (ref 150–400)
RBC: 4.71 MIL/uL (ref 4.22–5.81)
RDW: 11.7 % (ref 11.5–15.5)
WBC Count: 6 10*3/uL (ref 4.0–10.5)
nRBC: 0 % (ref 0.0–0.2)

## 2020-03-27 NOTE — Progress Notes (Signed)
Hematology and Oncology Follow Up Visit  Robert Young [id number] 1975/12/21 44 y.o. 03/27/2020   Principle Diagnosis:  Chronic immune thrombocytopenia  - Relapsed  Current Therapy:  Decadron 40 mg po q day x 4 days every 2 weeks - as needed Nplate - dose adjusted per pharmacy - as indicated     Interim History:  Robert Young is here today for follow-up.  We had to start him back on Decadron and Nplate.  His platelet count was down to 42,000.  He is doing quite well with the Nplate.  He is responding.  His platelet count has been over 150,000.  He has had no problems with bleeding.  He has had no bruising.  He has been working.  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  He has been very active.  He is going go do a Physicist, medical competition this weekend.  He and his family went on vacation this summer.  They went to the [facility] to see the family.  He has had no issues with fever.  He has had no cough or shortness of breath.  He has had no change in bowel or bladder habits.    Overall, his performance status is ECOG 0.    Medications:  Allergies as of 03/27/2020      Reactions   Zyrtec [cetirizine] Hypertension      Medication List    as of March 27, 2020  3:24 PM   You have not been prescribed any medications.     Allergies:  Allergies  Allergen Reactions  . Zyrtec [Cetirizine] Hypertension    Past Medical History, Surgical history, Social history, and Family History were reviewed and updated.  Review of Systems: Review of Systems  Constitutional: Negative.   HENT: Negative.   Eyes: Negative.   Respiratory: Negative.   Cardiovascular: Negative.   Gastrointestinal: Negative.   Genitourinary: Negative.   Musculoskeletal: Negative.   Skin: Negative.   Neurological: Negative.   Endo/Heme/Allergies: Negative.   Psychiatric/Behavioral: Negative.      Physical Exam:  weight is 182 lb (82.6 kg). His oral temperature is 98.9 F (37.2 C). His blood pressure is  125/77 and his pulse is 53. His respiration is 16 and oxygen saturation is 100%.   Wt Readings from Last 3 Encounters:  03/27/20 182 lb (82.6 kg)  02/04/20 182 lb 12.8 oz (82.9 kg)  12/27/19 179 lb 6.4 oz (81.4 kg)    Physical Exam Vitals reviewed.  HENT:     Head: Normocephalic and atraumatic.  Eyes:     Pupils: Pupils are equal, round, and reactive to light.  Cardiovascular:     Rate and Rhythm: Normal rate and regular rhythm.     Heart sounds: Normal heart sounds.  Pulmonary:     Effort: Pulmonary effort is normal.     Breath sounds: Normal breath sounds.  Abdominal:     General: Bowel sounds are normal.     Palpations: Abdomen is soft.  Musculoskeletal:        General: No tenderness or deformity. Normal range of motion.     Cervical back: Normal range of motion.  Lymphadenopathy:     Cervical: No cervical adenopathy.  Skin:    General: Skin is warm and dry.     Findings: No erythema or rash.  Neurological:     Mental Status: He is alert and oriented to person, place, and time.  Psychiatric:        Behavior: Behavior normal.  Thought Content: Thought content normal.        Judgment: Judgment normal.     Lab Results  Component Value Date   WBC 6.0 03/27/2020   HGB 14.5 03/27/2020   HCT 42.0 03/27/2020   MCV 89.2 03/27/2020   PLT 164 03/27/2020   No results found for: FERRITIN, IRON, TIBC, UIBC, IRONPCTSAT Lab Results  Component Value Date   RBC 4.71 03/27/2020   No results found for: KPAFRELGTCHN, LAMBDASER, KAPLAMBRATIO No results found for: IGGSERUM, IGA, IGMSERUM No results found for: Odetta Pink, SPEI   Chemistry      Component Value Date/Time   NA 142 02/21/2020 1150   NA 142 07/01/2017 1246   K 3.9 02/21/2020 1150   K 4.1 07/01/2017 1246   CL 106 02/21/2020 1150   CL 94 (L) 09/06/2016 1502   CO2 24 02/21/2020 1150   CO2 30 (H) 07/01/2017 1246   BUN 21 (H) 02/21/2020 1150   BUN 12.4  07/01/2017 1246   CREATININE 1.30 (H) 02/21/2020 1150   CREATININE 1.22 09/21/2018 0910   CREATININE 1.1 07/01/2017 1246      Component Value Date/Time   CALCIUM 9.2 02/21/2020 1150   CALCIUM 9.1 07/01/2017 1246   ALKPHOS 49 02/21/2020 1150   ALKPHOS 61 07/01/2017 1246   AST 20 02/21/2020 1150   AST 19 07/01/2017 1246   ALT 22 02/21/2020 1150   ALT 13 07/01/2017 1246   BILITOT 1.4 (H) 02/21/2020 1150   BILITOT 0.66 07/01/2017 1246     Impression and Plan: Robert Young is a 44 year old white male. He has relapsed immune thrombocytopenia.  He really has not had a relapse for a year or so.  As such, I knew that he would respond to the Decadron and Nplate.  I would keep him on every 2-week Nplate.  I think this really is okay for right now.  If we find that his platelet count stabilizes, then we will move to every 3-week.  Hopefully, we might be able to get him off the Nplate at some point.  He understands that the ITP will relapse at some point in the future.  He just wants to hold off on doing anything more aggressive and invasive.  I will plan to see him back in 6 weeks.      Volanda Napoleon, MD 7/29/20213:24 PM

## 2020-03-31 ENCOUNTER — Encounter: Payer: Self-pay | Admitting: Osteopathic Medicine

## 2020-04-01 ENCOUNTER — Other Ambulatory Visit: Payer: Self-pay

## 2020-04-01 ENCOUNTER — Ambulatory Visit: Payer: 59 | Admitting: Family Medicine

## 2020-04-01 ENCOUNTER — Encounter: Payer: Self-pay | Admitting: Family Medicine

## 2020-04-01 DIAGNOSIS — L03011 Cellulitis of right finger: Secondary | ICD-10-CM

## 2020-04-01 DIAGNOSIS — L0390 Cellulitis, unspecified: Secondary | ICD-10-CM | POA: Insufficient documentation

## 2020-04-01 MED ORDER — CEPHALEXIN 500 MG PO CAPS
500.0000 mg | ORAL_CAPSULE | Freq: Four times a day (QID) | ORAL | 0 refills | Status: DC
Start: 2020-04-01 — End: 2020-04-01

## 2020-04-01 MED ORDER — CEPHALEXIN 500 MG PO CAPS: 500.0000 mg | ORAL_CAPSULE | Freq: Four times a day (QID) | ORAL | 0 refills | Status: AC

## 2020-04-01 MED FILL — CEPHALEXIN 500 MG CAPSULE: 500 | 10 days supply | Qty: 40 | Fill #0

## 2020-04-01 NOTE — Telephone Encounter (Signed)
[facility] has failed to receive the medication. Sent the prescription to [facility] instead.

## 2020-04-01 NOTE — Progress Notes (Signed)
Robert Young - 44 y.o. male MRN [id number]  Date of birth: [birth year]  Subjective Chief Complaint  Patient presents with  . Hand Injury    HPI Robert Young is a 44 y.o. male here today with complaint of hand swelling.  He helped a friend move a piano last week.  The following day he did a [employer] race.  Noticed swelling in his R hand the day after.  Now with erythema, some mild pain and continued swelling.  He denies fever, chills, or spreading of redness.  He has not really tried anything for this so far.    ROS:  A comprehensive ROS was completed and negative except as noted per HPI  Allergies  Allergen Reactions  . Zyrtec [Cetirizine] Hypertension    Past Medical History:  Diagnosis Date  . Allergy   . Idiopathic thrombocythemia ([place])   . Left lower lobe pneumonia 08/24/2017    Past Surgical History:  Procedure Laterality Date  . TONSILLECTOMY    . TONSILLECTOMY AND ADENOIDECTOMY      Social History   Socioeconomic History  . Marital status: Married    Spouse name: Not on file  . Number of children: Not on file  . Years of education: Not on file  . Highest education level: Not on file  Occupational History  . Occupation: Marketing executive   Tobacco Use  . Smoking status: Never Smoker  . Smokeless tobacco: Former Systems developer    Types: Chew  . Tobacco comment: NEVER USED TOBACCO  Vaping Use  . Vaping Use: Never used  Substance and Sexual Activity  . Alcohol use: Yes    Alcohol/week: 0.0 standard drinks    Comment: few drinks per week   . Drug use: No  . Sexual activity: Yes    Birth control/protection: None  Other Topics Concern  . Not on file  Social History Narrative  . Not on file   Social Determinants of Health   Financial Resource Strain:   . Difficulty of Paying Living Expenses:   Food Insecurity:   . Worried About Charity fundraiser in the Last Year:   . Arboriculturist in the Last Year:   Transportation Needs:   . Film/video editor (Medical):   [name]  Lack of Transportation (Non-Medical):   Physical Activity:   . Days of Exercise per Week:   . Minutes of Exercise per Session:   Stress:   . Feeling of Stress :   Social Connections:   . Frequency of Communication with Friends and Family:   . Frequency of Social Gatherings with Friends and Family:   . Attends Religious Services:   . Active Member of Clubs or Organizations:   . Attends Archivist Meetings:   [name] Marital Status:     Family History  Problem Relation Age of Onset  . Heart disease Father   . Hyperlipidemia Father   . Hypertension Father   . Stroke Father        at age 63's   . Heart disease Paternal Uncle   . Cancer Paternal Grandmother        breast    Health Maintenance  Topic Date Due  . INFLUENZA VACCINE  03/30/2020  . TETANUS/TDAP  09/21/2028  . Hepatitis C Screening  Completed  . HIV Screening  Completed     ----------------------------------------------------------------------------------------------------------------------------------------------------------------------------------------------------------------- Physical Exam BP (!) 144/82 (BP Location: Left Arm, Patient Position: Sitting, Cuff Size: Normal)   Pulse 61  Temp 98.3 F (36.8 C) (Temporal)   Ht 5' 8.9" (1.75 m)   Wt 183 lb 1.6 oz (83.1 kg)   SpO2 100%   BMI 27.12 kg/m   Physical Exam Constitutional:      Appearance: Normal appearance.  Cardiovascular:     Rate and Rhythm: Normal rate and regular rhythm.  Skin:    Comments: Erythema and swelling with mild ttp at distal palm of 4th and 5th digit of R hand  No drainage or fluctuance.  No crepitus noted.   Neurological:     Mental Status: He is alert.    Media Information    ------------------------------------------------------------------------------------------------------------------------------------------------------------------------------------------------------------------- Assessment and  Plan  Cellulitis Will treat with cephalexin 500mg  QID x10 days.  Follow up in 2 days to be sure this is not worsening.  Instructed to return sooner if having worsening symptoms, fever, or chills.    Meds ordered this encounter  Medications  . DISCONTD: cephALEXin (KEFLEX) 500 MG capsule    Sig: Take 1 capsule (500 mg total) by mouth 4 (four) times daily for 10 days.    Dispense:  40 capsule    Refill:  0  . cephALEXin (KEFLEX) 500 MG capsule    Sig: Take 1 capsule (500 mg total) by mouth 4 (four) times daily for 10 days.    Dispense:  40 capsule    Refill:  0    Return in about 2 days (around 04/03/2020) for cellulitis.    This visit occurred during the SARS-CoV-2 public health emergency.  Safety protocols were in place, including screening questions prior to the visit, additional usage of staff PPE, and extensive cleaning of exam room while observing appropriate contact time as indicated for disinfecting solutions.

## 2020-04-01 NOTE — Patient Instructions (Signed)

## 2020-04-01 NOTE — Assessment & Plan Note (Signed)
Will treat with cephalexin 500mg  QID x10 days.  Follow up in 2 days to be sure this is not worsening.  Instructed to return sooner if having worsening symptoms, fever, or chills.

## 2020-04-03 ENCOUNTER — Encounter: Payer: Self-pay | Admitting: Family Medicine

## 2020-04-03 ENCOUNTER — Other Ambulatory Visit: Payer: Self-pay

## 2020-04-03 ENCOUNTER — Other Ambulatory Visit: Payer: 59

## 2020-04-03 ENCOUNTER — Ambulatory Visit: Payer: 59

## 2020-04-03 ENCOUNTER — Ambulatory Visit (INDEPENDENT_AMBULATORY_CARE_PROVIDER_SITE_OTHER): Payer: 59 | Admitting: Family Medicine

## 2020-04-03 ENCOUNTER — Ambulatory Visit: Payer: 59 | Admitting: Family Medicine

## 2020-04-03 DIAGNOSIS — L03011 Cellulitis of right finger: Secondary | ICD-10-CM | POA: Diagnosis not present

## 2020-04-03 NOTE — Assessment & Plan Note (Signed)
Erythema and swelling improved.  He will complete course of cephalexin.  Instructed to follow up for new or worsening symptoms.

## 2020-04-03 NOTE — Progress Notes (Signed)
Robert Young - 44 y.o. male MRN [id number]  Date of birth: Jun 30, 1976  Subjective Chief Complaint  Patient presents with  . Follow-up    HPI Robert Young is a 44 y.o. male here today for follow up of cellulitis of the hand.  Started on cephalexin 2 days ago.  Reports improvement in swelling and redness today.  Able to flex fingers more easily.  Denies fever or chills.   ROS:  A comprehensive ROS was completed and negative except as noted per HPI  Allergies  Allergen Reactions  . Zyrtec [Cetirizine] Hypertension    Past Medical History:  Diagnosis Date  . Allergy   . Idiopathic thrombocythemia ([place])   . Left lower lobe pneumonia 08/24/2017    Past Surgical History:  Procedure Laterality Date  . TONSILLECTOMY    . TONSILLECTOMY AND ADENOIDECTOMY      Social History   Socioeconomic History  . Marital status: Married    Spouse name: Not on file  . Number of children: Not on file  . Years of education: Not on file  . Highest education level: Not on file  Occupational History  . Occupation: Marketing executive   Tobacco Use  . Smoking status: Never Smoker  . Smokeless tobacco: Former Systems developer    Types: Chew  . Tobacco comment: NEVER USED TOBACCO  Vaping Use  . Vaping Use: Never used  Substance and Sexual Activity  . Alcohol use: Yes    Alcohol/week: 0.0 standard drinks    Comment: few drinks per week   . Drug use: No  . Sexual activity: Yes    Birth control/protection: None  Other Topics Concern  . Not on file  Social History Narrative  . Not on file   Social Determinants of Health   Financial Resource Strain:   . Difficulty of Paying Living Expenses:   Food Insecurity:   . Worried About Charity fundraiser in the Last Year:   . Arboriculturist in the Last Year:   Transportation Needs:   . Film/video editor (Medical):   [name] Lack of Transportation (Non-Medical):   Physical Activity:   . Days of Exercise per Week:   . Minutes of Exercise per Session:   Stress:    . Feeling of Stress :   Social Connections:   . Frequency of Communication with Friends and Family:   . Frequency of Social Gatherings with Friends and Family:   . Attends Religious Services:   . Active Member of Clubs or Organizations:   . Attends Archivist Meetings:   [name] Marital Status:     Family History  Problem Relation Age of Onset  . Heart disease Father   . Hyperlipidemia Father   . Hypertension Father   . Stroke Father        at age 92's   . Heart disease Paternal Uncle   . Cancer Paternal Grandmother        breast    Health Maintenance  Topic Date Due  . INFLUENZA VACCINE  03/30/2020  . TETANUS/TDAP  09/21/2028  . Hepatitis C Screening  Completed  . HIV Screening  Completed     ----------------------------------------------------------------------------------------------------------------------------------------------------------------------------------------------------------------- Physical Exam BP 133/85 (BP Location: Left Arm, Patient Position: Sitting, Cuff Size: Normal)   Pulse (!) 52   Ht 5' 8.9" (1.75 m)   Wt 184 lb 6.4 oz (83.6 kg)   SpO2 100%   BMI 27.31 kg/m   Physical Exam Constitutional:  Appearance: Normal appearance.  Skin:    General: Skin is warm and dry.  Neurological:     General: No focal deficit present.     Mental Status: He is alert.     ------------------------------------------------------------------------------------------------------------------------------------------------------------------------------------------------------------------- Assessment and Plan  Cellulitis Erythema and swelling improved.  He will complete course of cephalexin.  Instructed to follow up for new or worsening symptoms.     No orders of the defined types were placed in this encounter.   No follow-ups on file.    This visit occurred during the SARS-CoV-2 public health emergency.  Safety protocols were in place,  including screening questions prior to the visit, additional usage of staff PPE, and extensive cleaning of exam room while observing appropriate contact time as indicated for disinfecting solutions.

## 2020-04-10 ENCOUNTER — Inpatient Hospital Stay: Payer: 59 | Attending: Hematology & Oncology

## 2020-04-10 ENCOUNTER — Inpatient Hospital Stay: Payer: 59

## 2020-04-10 ENCOUNTER — Other Ambulatory Visit: Payer: Self-pay

## 2020-04-10 VITALS — BP 132/72 | HR 64 | Temp 98.2°F | Resp 18

## 2020-04-10 DIAGNOSIS — D693 Immune thrombocytopenic purpura: Secondary | ICD-10-CM | POA: Insufficient documentation

## 2020-04-10 DIAGNOSIS — Z79899 Other long term (current) drug therapy: Secondary | ICD-10-CM | POA: Diagnosis not present

## 2020-04-10 LAB — CBC WITH DIFFERENTIAL (CANCER CENTER ONLY)
Abs Immature Granulocytes: 0.01 10*3/uL (ref 0.00–0.07)
Basophils Absolute: 0 10*3/uL (ref 0.0–0.1)
Basophils Relative: 0 %
Eosinophils Absolute: 0.4 10*3/uL (ref 0.0–0.5)
Eosinophils Relative: 7 %
HCT: 42.5 % (ref 39.0–52.0)
Hemoglobin: 14.5 g/dL (ref 13.0–17.0)
Immature Granulocytes: 0 %
Lymphocytes Relative: 27 %
Lymphs Abs: 1.6 10*3/uL (ref 0.7–4.0)
MCH: 30.7 pg (ref 26.0–34.0)
MCHC: 34.1 g/dL (ref 30.0–36.0)
MCV: 89.9 fL (ref 80.0–100.0)
Monocytes Absolute: 0.5 10*3/uL (ref 0.1–1.0)
Monocytes Relative: 8 %
Neutro Abs: 3.6 10*3/uL (ref 1.7–7.7)
Neutrophils Relative %: 58 %
Platelet Count: 175 10*3/uL (ref 150–400)
RBC: 4.73 MIL/uL (ref 4.22–5.81)
RDW: 11.8 % (ref 11.5–15.5)
WBC Count: 6.1 10*3/uL (ref 4.0–10.5)
nRBC: 0 % (ref 0.0–0.2)

## 2020-04-10 MED ORDER — ROMIPLOSTIM 125 MCG ~~LOC~~ SOLR: 1.0000 ug/kg | SUBCUTANEOUS | Status: DC

## 2020-04-10 NOTE — Progress Notes (Addendum)
Reviewed pltc with Dr. Marin Olp today. Pltc = 175. Hold Nplate today and return for lab/Nplate in 2 weeks as scheduled on 04/24/20.  Hardie Pulley, PharmD, BCPS, BCOP

## 2020-04-24 ENCOUNTER — Inpatient Hospital Stay: Payer: 59

## 2020-04-24 ENCOUNTER — Other Ambulatory Visit: Payer: Self-pay

## 2020-04-24 VITALS — BP 131/81 | HR 54 | Resp 18

## 2020-04-24 DIAGNOSIS — D693 Immune thrombocytopenic purpura: Secondary | ICD-10-CM | POA: Diagnosis not present

## 2020-04-24 DIAGNOSIS — Z79899 Other long term (current) drug therapy: Secondary | ICD-10-CM | POA: Diagnosis not present

## 2020-04-24 LAB — CBC WITH DIFFERENTIAL (CANCER CENTER ONLY)
Abs Immature Granulocytes: 0.01 10*3/uL (ref 0.00–0.07)
Basophils Absolute: 0 10*3/uL (ref 0.0–0.1)
Basophils Relative: 0 %
Eosinophils Absolute: 0.4 10*3/uL (ref 0.0–0.5)
Eosinophils Relative: 8 %
HCT: 42.1 % (ref 39.0–52.0)
Hemoglobin: 14.5 g/dL (ref 13.0–17.0)
Immature Granulocytes: 0 %
Lymphocytes Relative: 25 %
Lymphs Abs: 1.3 10*3/uL (ref 0.7–4.0)
MCH: 30.7 pg (ref 26.0–34.0)
MCHC: 34.4 g/dL (ref 30.0–36.0)
MCV: 89 fL (ref 80.0–100.0)
Monocytes Absolute: 0.4 10*3/uL (ref 0.1–1.0)
Monocytes Relative: 7 %
Neutro Abs: 2.9 10*3/uL (ref 1.7–7.7)
Neutrophils Relative %: 60 %
Platelet Count: 121 10*3/uL — ABNORMAL LOW (ref 150–400)
RBC: 4.73 MIL/uL (ref 4.22–5.81)
RDW: 11.6 % (ref 11.5–15.5)
WBC Count: 5 10*3/uL (ref 4.0–10.5)
nRBC: 0 % (ref 0.0–0.2)

## 2020-04-24 LAB — CMP (CANCER CENTER ONLY)
ALT: 9 U/L (ref 0–44)
AST: 16 U/L (ref 15–41)
Albumin: 4.6 g/dL (ref 3.5–5.0)
Alkaline Phosphatase: 50 U/L (ref 38–126)
Anion gap: 3 — ABNORMAL LOW (ref 5–15)
BUN: 19 mg/dL (ref 6–20)
CO2: 33 mmol/L — ABNORMAL HIGH (ref 22–32)
Calcium: 9.4 mg/dL (ref 8.9–10.3)
Chloride: 103 mmol/L (ref 98–111)
Creatinine: 1.44 mg/dL — ABNORMAL HIGH (ref 0.61–1.24)
GFR, Est AFR Am: >60 mL/min (ref >60)
GFR, Estimated: 59 mL/min — ABNORMAL LOW (ref >60)
Glucose, Bld: 123 mg/dL — ABNORMAL HIGH (ref 70–99)
Potassium: 3.7 mmol/L (ref 3.5–5.1)
Sodium: 139 mmol/L (ref 135–145)
Total Bilirubin: 1.2 mg/dL (ref 0.3–1.2)
Total Protein: 6.6 g/dL (ref 6.5–8.1)

## 2020-04-24 LAB — LACTATE DEHYDROGENASE: LDH: 189 U/L (ref 98–192)

## 2020-04-24 LAB — PLATELET BY CITRATE

## 2020-04-24 LAB — SAVE SMEAR(SSMR), FOR PROVIDER SLIDE REVIEW

## 2020-04-24 MED ORDER — ROMIPLOSTIM 125 MCG ~~LOC~~ SOLR
80.0000 ug | SUBCUTANEOUS | Status: DC
  Administered 2020-04-24: 80 ug via SUBCUTANEOUS
  Filled 2020-04-24: qty 0.16

## 2020-04-24 NOTE — Progress Notes (Signed)
Platelets trending down, today 121. Per Dr. Marin Olp will give NPlate. Will dose at same previous dose (~1 mcg/kg) for 80 mcg today.   Eddie Candle, PharmD PGY-2 Hematology/Oncology Pharmacy Resident

## 2020-04-24 NOTE — Patient Instructions (Signed)
Romiplostim injection What is this medicine? ROMIPLOSTIM (roe mi PLOE stim) helps your body make more platelets. This medicine is used to treat low platelets caused by chronic idiopathic thrombocytopenic purpura (ITP). This medicine may be used for other purposes; ask your health care provider or pharmacist if you have questions. COMMON BRAND NAME(S): Nplate What should I tell my health care provider before I take this medicine? They need to know if you have any of these conditions:  bleeding disorders  bone marrow problem, like blood cancer or myelodysplastic syndrome  history of blood clots  liver disease  surgery to remove your spleen  an unusual or allergic reaction to romiplostim, mannitol, other medicines, foods, dyes, or preservatives  pregnant or trying to get pregnant  breast-feeding How should I use this medicine? This medicine is for injection under the skin. It is given by a health care professional in a hospital or clinic setting. A special MedGuide will be given to you before your injection. Read this information carefully each time. Talk to your pediatrician regarding the use of this medicine in children. While this drug may be prescribed for children as young as 1 year for selected conditions, precautions do apply. Overdosage: If you think you have taken too much of this medicine contact a poison control center or emergency room at once. NOTE: This medicine is only for you. Do not share this medicine with others. What if I miss a dose? It is important not to miss your dose. Call your doctor or health care professional if you are unable to keep an appointment. What may interact with this medicine? Interactions are not expected. This list may not describe all possible interactions. Give your health care provider a list of all the medicines, herbs, non-prescription drugs, or dietary supplements you use. Also tell them if you smoke, drink alcohol, or use illegal drugs.  Some items may interact with your medicine. What should I watch for while using this medicine? Your condition will be monitored carefully while you are receiving this medicine. Visit your prescriber or health care professional for regular checks on your progress and for the needed blood tests. It is important to keep all appointments. What side effects may I notice from receiving this medicine? Side effects that you should report to your doctor or health care professional as soon as possible:  allergic reactions like skin rash, itching or hives, swelling of the face, lips, or tongue  signs and symptoms of bleeding such as bloody or black, tarry stools; red or dark brown urine; spitting up blood or brown material that looks like coffee grounds; red spots on the skin; unusual bruising or bleeding from the eyes, gums, or nose  signs and symptoms of a blood clot such as chest pain; shortness of breath; pain, swelling, or warmth in the leg  signs and symptoms of a stroke like changes in vision; confusion; trouble speaking or understanding; severe headaches; sudden numbness or weakness of the face, arm or leg; trouble walking; dizziness; loss of balance or coordination Side effects that usually do not require medical attention (report to your doctor or health care professional if they continue or are bothersome):  headache  pain in arms and legs  pain in mouth  stomach pain This list may not describe all possible side effects. Call your doctor for medical advice about side effects. You may report side effects to FDA at 1-800-FDA-1088. Where should I keep my medicine? This drug is given in a hospital or clinic   and will not be stored at home. NOTE: This sheet is a summary. It may not cover all possible information. If you have questions about this medicine, talk to your doctor, pharmacist, or health care provider.  2020 Elsevier/Gold Standard (2017-08-15 11:10:55)  

## 2020-05-03 ENCOUNTER — Encounter: Payer: Self-pay | Admitting: Hematology & Oncology

## 2020-05-08 ENCOUNTER — Inpatient Hospital Stay: Payer: 59 | Admitting: Hematology & Oncology

## 2020-05-08 ENCOUNTER — Inpatient Hospital Stay: Payer: 59

## 2020-05-16 ENCOUNTER — Inpatient Hospital Stay: Payer: 59

## 2020-05-16 ENCOUNTER — Encounter: Payer: Self-pay | Admitting: Hematology & Oncology

## 2020-05-16 ENCOUNTER — Inpatient Hospital Stay (HOSPITAL_BASED_OUTPATIENT_CLINIC_OR_DEPARTMENT_OTHER): Payer: 59 | Admitting: Hematology & Oncology

## 2020-05-16 ENCOUNTER — Other Ambulatory Visit: Payer: Self-pay

## 2020-05-16 ENCOUNTER — Inpatient Hospital Stay: Payer: 59 | Attending: Hematology & Oncology

## 2020-05-16 VITALS — BP 121/79 | HR 67 | Temp 97.5°F | Resp 18 | Ht 69.0 in | Wt 176.8 lb

## 2020-05-16 DIAGNOSIS — D693 Immune thrombocytopenic purpura: Secondary | ICD-10-CM

## 2020-05-16 DIAGNOSIS — Z79899 Other long term (current) drug therapy: Secondary | ICD-10-CM | POA: Insufficient documentation

## 2020-05-16 LAB — CBC WITH DIFFERENTIAL (CANCER CENTER ONLY)
Abs Immature Granulocytes: 0.01 10*3/uL (ref 0.00–0.07)
Basophils Absolute: 0 10*3/uL (ref 0.0–0.1)
Basophils Relative: 0 %
Eosinophils Absolute: 0.3 10*3/uL (ref 0.0–0.5)
Eosinophils Relative: 6 %
HCT: 42.6 % (ref 39.0–52.0)
Hemoglobin: 14.8 g/dL (ref 13.0–17.0)
Immature Granulocytes: 0 %
Lymphocytes Relative: 23 %
Lymphs Abs: 1 10*3/uL (ref 0.7–4.0)
MCH: 30.7 pg (ref 26.0–34.0)
MCHC: 34.7 g/dL (ref 30.0–36.0)
MCV: 88.4 fL (ref 80.0–100.0)
Monocytes Absolute: 0.4 10*3/uL (ref 0.1–1.0)
Monocytes Relative: 9 %
Neutro Abs: 2.7 10*3/uL (ref 1.7–7.7)
Neutrophils Relative %: 62 %
Platelet Count: 123 10*3/uL — ABNORMAL LOW (ref 150–400)
RBC: 4.82 MIL/uL (ref 4.22–5.81)
RDW: 11.4 % — ABNORMAL LOW (ref 11.5–15.5)
WBC Count: 4.4 10*3/uL (ref 4.0–10.5)
nRBC: 0 % (ref 0.0–0.2)

## 2020-05-16 MED ORDER — ROMIPLOSTIM 125 MCG ~~LOC~~ SOLR
1.0000 ug/kg | SUBCUTANEOUS | Status: DC
  Administered 2020-05-16: 80 ug via SUBCUTANEOUS
  Filled 2020-05-16: qty 0.16

## 2020-05-16 NOTE — Patient Instructions (Signed)
Romiplostim injection What is this medicine? ROMIPLOSTIM (roe mi PLOE stim) helps your body make more platelets. This medicine is used to treat low platelets caused by chronic idiopathic thrombocytopenic purpura (ITP). This medicine may be used for other purposes; ask your health care provider or pharmacist if you have questions. COMMON BRAND NAME(S): Nplate What should I tell my health care provider before I take this medicine? They need to know if you have any of these conditions:  bleeding disorders  bone marrow problem, like blood cancer or myelodysplastic syndrome  history of blood clots  liver disease  surgery to remove your spleen  an unusual or allergic reaction to romiplostim, mannitol, other medicines, foods, dyes, or preservatives  pregnant or trying to get pregnant  breast-feeding How should I use this medicine? This medicine is for injection under the skin. It is given by a health care professional in a hospital or clinic setting. A special MedGuide will be given to you before your injection. Read this information carefully each time. Talk to your pediatrician regarding the use of this medicine in children. While this drug may be prescribed for children as young as 1 year for selected conditions, precautions do apply. Overdosage: If you think you have taken too much of this medicine contact a poison control center or emergency room at once. NOTE: This medicine is only for you. Do not share this medicine with others. What if I miss a dose? It is important not to miss your dose. Call your doctor or health care professional if you are unable to keep an appointment. What may interact with this medicine? Interactions are not expected. This list may not describe all possible interactions. Give your health care provider a list of all the medicines, herbs, non-prescription drugs, or dietary supplements you use. Also tell them if you smoke, drink alcohol, or use illegal drugs.  Some items may interact with your medicine. What should I watch for while using this medicine? Your condition will be monitored carefully while you are receiving this medicine. Visit your prescriber or health care professional for regular checks on your progress and for the needed blood tests. It is important to keep all appointments. What side effects may I notice from receiving this medicine? Side effects that you should report to your doctor or health care professional as soon as possible:  allergic reactions like skin rash, itching or hives, swelling of the face, lips, or tongue  signs and symptoms of bleeding such as bloody or black, tarry stools; red or dark brown urine; spitting up blood or brown material that looks like coffee grounds; red spots on the skin; unusual bruising or bleeding from the eyes, gums, or nose  signs and symptoms of a blood clot such as chest pain; shortness of breath; pain, swelling, or warmth in the leg  signs and symptoms of a stroke like changes in vision; confusion; trouble speaking or understanding; severe headaches; sudden numbness or weakness of the face, arm or leg; trouble walking; dizziness; loss of balance or coordination Side effects that usually do not require medical attention (report to your doctor or health care professional if they continue or are bothersome):  headache  pain in arms and legs  pain in mouth  stomach pain This list may not describe all possible side effects. Call your doctor for medical advice about side effects. You may report side effects to FDA at 1-800-FDA-1088. Where should I keep my medicine? This drug is given in a hospital or clinic   and will not be stored at home. NOTE: This sheet is a summary. It may not cover all possible information. If you have questions about this medicine, talk to your doctor, pharmacist, or health care provider.  2020 Elsevier/Gold Standard (2017-08-15 11:10:55)  

## 2020-05-16 NOTE — Progress Notes (Signed)
Hematology and Oncology Follow Up Visit  Robert Young [id number] Feb 10, 1976 44 y.o. 05/16/2020   Principle Diagnosis:  Chronic immune thrombocytopenia  - Relapsed  Current Therapy:  Decadron 40 mg po q day x 4 days every 2 weeks - as needed Nplate - dose adjusted per pharmacy - as indicated     Interim History:  Robert Young is here today for follow-up.  He looks very casual today.  He is taking some time off from work.  He has had no problems with bleeding or bruising.  He is been doing a lot of things around the house.  Has been exercising.  He has had no problems with nausea or vomiting.  Has had no problems with fever.  He has had no change in bowel or bladder habits.  There has been no rashes.  He has had no headache.  Overall, his performance status is ECOG 0.      Medications:  Allergies as of 05/16/2020      Reactions   Zyrtec [cetirizine] Hypertension      Medication List    as of May 16, 2020  8:02 AM   You have not been prescribed any medications.     Allergies:  Allergies  Allergen Reactions  . Zyrtec [Cetirizine] Hypertension    Past Medical History, Surgical history, Social history, and Family History were reviewed and updated.  Review of Systems: Review of Systems  Constitutional: Negative.   HENT: Negative.   Eyes: Negative.   Respiratory: Negative.   Cardiovascular: Negative.   Gastrointestinal: Negative.   Genitourinary: Negative.   Musculoskeletal: Negative.   Skin: Negative.   Neurological: Negative.   Endo/Heme/Allergies: Negative.   Psychiatric/Behavioral: Negative.      Physical Exam:  vitals were not taken for this visit.   Wt Readings from Last 3 Encounters:  04/03/20 184 lb 6.4 oz (83.6 kg)  04/01/20 183 lb 1.6 oz (83.1 kg)  03/27/20 182 lb (82.6 kg)    Physical Exam Vitals reviewed.  HENT:     Head: Normocephalic and atraumatic.  Eyes:     Pupils: Pupils are equal, round, and reactive to light.    Cardiovascular:     Rate and Rhythm: Normal rate and regular rhythm.     Heart sounds: Normal heart sounds.  Pulmonary:     Effort: Pulmonary effort is normal.     Breath sounds: Normal breath sounds.  Abdominal:     General: Bowel sounds are normal.     Palpations: Abdomen is soft.  Musculoskeletal:        General: No tenderness or deformity. Normal range of motion.     Cervical back: Normal range of motion.  Lymphadenopathy:     Cervical: No cervical adenopathy.  Skin:    General: Skin is warm and dry.     Findings: No erythema or rash.  Neurological:     Mental Status: He is alert and oriented to person, place, and time.  Psychiatric:        Behavior: Behavior normal.        Thought Content: Thought content normal.        Judgment: Judgment normal.     Lab Results  Component Value Date   WBC 4.4 05/16/2020   HGB 14.8 05/16/2020   HCT 42.6 05/16/2020   MCV 88.4 05/16/2020   PLT 123 (L) 05/16/2020   No results found for: FERRITIN, IRON, TIBC, UIBC, IRONPCTSAT Lab Results  Component Value Date   RBC 4.82  05/16/2020   No results found for: KPAFRELGTCHN, LAMBDASER, KAPLAMBRATIO No results found for: IGGSERUM, IGA, IGMSERUM No results found for: Odetta Pink, SPEI   Chemistry      Component Value Date/Time   NA 139 04/24/2020 1302   NA 142 07/01/2017 1246   K 3.7 04/24/2020 1302   K 4.1 07/01/2017 1246   CL 103 04/24/2020 1302   CL 94 (L) 09/06/2016 1502   CO2 33 (H) 04/24/2020 1302   CO2 30 (H) 07/01/2017 1246   BUN 19 04/24/2020 1302   BUN 12.4 07/01/2017 1246   CREATININE 1.44 (H) 04/24/2020 1302   CREATININE 1.22 09/21/2018 0910   CREATININE 1.1 07/01/2017 1246      Component Value Date/Time   CALCIUM 9.4 04/24/2020 1302   CALCIUM 9.1 07/01/2017 1246   ALKPHOS 50 04/24/2020 1302   ALKPHOS 61 07/01/2017 1246   AST 16 04/24/2020 1302   AST 19 07/01/2017 1246   ALT 9 04/24/2020 1302   ALT 13  07/01/2017 1246   BILITOT 1.2 04/24/2020 1302   BILITOT 0.66 07/01/2017 1246     Impression and Plan: Robert Young is a 44 year old white male. He has relapsed immune thrombocytopenia.  He really has not had a relapse for a year or so.  As such, I knew that he would respond to the Decadron and Nplate.  I we will now try to move his Nplate out to monthly.  His platelet count is holding quite steady.  He is doing well.  I think if we can move his appointments out a little bit longer, he will be doing well.  We will plan to get him back in a month.    Volanda Napoleon, MD 9/17/20218:02 AM

## 2020-06-13 ENCOUNTER — Inpatient Hospital Stay: Payer: 59 | Attending: Hematology & Oncology

## 2020-06-13 ENCOUNTER — Inpatient Hospital Stay: Payer: 59

## 2020-06-13 ENCOUNTER — Other Ambulatory Visit: Payer: Self-pay

## 2020-06-13 ENCOUNTER — Inpatient Hospital Stay (HOSPITAL_BASED_OUTPATIENT_CLINIC_OR_DEPARTMENT_OTHER): Payer: 59 | Admitting: Hematology & Oncology

## 2020-06-13 ENCOUNTER — Encounter: Payer: Self-pay | Admitting: Hematology & Oncology

## 2020-06-13 VITALS — BP 148/92 | HR 63 | Temp 98.1°F | Resp 16 | Wt 182.0 lb

## 2020-06-13 DIAGNOSIS — D693 Immune thrombocytopenic purpura: Secondary | ICD-10-CM | POA: Insufficient documentation

## 2020-06-13 LAB — CBC WITH DIFFERENTIAL (CANCER CENTER ONLY)
Abs Immature Granulocytes: 0.01 10*3/uL (ref 0.00–0.07)
Basophils Absolute: 0 10*3/uL (ref 0.0–0.1)
Basophils Relative: 0 %
Eosinophils Absolute: 0.4 10*3/uL (ref 0.0–0.5)
Eosinophils Relative: 7 %
HCT: 42.8 % (ref 39.0–52.0)
Hemoglobin: 14.9 g/dL (ref 13.0–17.0)
Immature Granulocytes: 0 %
Lymphocytes Relative: 24 %
Lymphs Abs: 1.3 10*3/uL (ref 0.7–4.0)
MCH: 30.6 pg (ref 26.0–34.0)
MCHC: 34.8 g/dL (ref 30.0–36.0)
MCV: 87.9 fL (ref 80.0–100.0)
Monocytes Absolute: 0.4 10*3/uL (ref 0.1–1.0)
Monocytes Relative: 8 %
Neutro Abs: 3.1 10*3/uL (ref 1.7–7.7)
Neutrophils Relative %: 61 %
Platelet Count: 103 10*3/uL — ABNORMAL LOW (ref 150–400)
RBC: 4.87 MIL/uL (ref 4.22–5.81)
RDW: 11.5 % (ref 11.5–15.5)
WBC Count: 5.2 10*3/uL (ref 4.0–10.5)
nRBC: 0 % (ref 0.0–0.2)

## 2020-06-13 LAB — CMP (CANCER CENTER ONLY)
ALT: 13 U/L (ref 0–44)
AST: 19 U/L (ref 15–41)
Albumin: 4.8 g/dL (ref 3.5–5.0)
Alkaline Phosphatase: 46 U/L (ref 38–126)
Anion gap: 7 (ref 5–15)
BUN: 19 mg/dL (ref 6–20)
CO2: 32 mmol/L (ref 22–32)
Calcium: 9.7 mg/dL (ref 8.9–10.3)
Chloride: 102 mmol/L (ref 98–111)
Creatinine: 1.28 mg/dL — ABNORMAL HIGH (ref 0.61–1.24)
GFR, Estimated: >60 mL/min (ref >60)
Glucose, Bld: 70 mg/dL (ref 70–99)
Potassium: 3.9 mmol/L (ref 3.5–5.1)
Sodium: 141 mmol/L (ref 135–145)
Total Bilirubin: 1.2 mg/dL (ref 0.3–1.2)
Total Protein: 6.2 g/dL — ABNORMAL LOW (ref 6.5–8.1)

## 2020-06-13 LAB — PLATELET BY CITRATE

## 2020-06-13 LAB — SAVE SMEAR(SSMR), FOR PROVIDER SLIDE REVIEW

## 2020-06-13 MED ORDER — ROMIPLOSTIM 125 MCG ~~LOC~~ SOLR
80.0000 ug | SUBCUTANEOUS | Status: DC
  Administered 2020-06-13: 80 ug via SUBCUTANEOUS
  Filled 2020-06-13: qty 0.16

## 2020-06-13 NOTE — Progress Notes (Signed)
Hematology and Oncology Follow Up Visit  Robert Young [id number] [birth year] 44 y.o. 06/13/2020   Principle Diagnosis:  Chronic immune thrombocytopenia  - Relapsed  Current Therapy:  Decadron 40 mg po q day x 4 days every 2 weeks - as needed Nplate - dose adjusted per pharmacy - as indicated     Interim History:  Robert Young is here today for follow-up. As always, he is doing quite well. He exercises quite a bit.  He has been working quite hard at the [facility]. 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  He has had no problems with bleeding. There is been no bruising.  He has had no problems with fever, sweats or chills. He has had no swollen lymph nodes. He has had no change in bowel or bladder habits. He has had no nausea or vomiting.  There is been no leg swelling.  He was involved with a outdoor [employer]. He sustained an infection on his right hand. Thankfully, he did not need surgery for this.  Overall, his performance status is ECOG 0.     Medications:  Allergies as of 06/13/2020      Reactions   Zyrtec [cetirizine] Hypertension      Medication List    as of June 13, 2020  8:34 AM   You have not been prescribed any medications.     Allergies:  Allergies  Allergen Reactions  . Zyrtec [Cetirizine] Hypertension    Past Medical History, Surgical history, Social history, and Family History were reviewed and updated.  Review of Systems: Review of Systems  Constitutional: Negative.   HENT: Negative.   Eyes: Negative.   Respiratory: Negative.   Cardiovascular: Negative.   Gastrointestinal: Negative.   Genitourinary: Negative.   Musculoskeletal: Negative.   Skin: Negative.   Neurological: Negative.   Endo/Heme/Allergies: Negative.   Psychiatric/Behavioral: Negative.      Physical Exam:  weight is 182 lb (82.6 kg). His oral temperature is 98.1 F (36.7 C). His  blood pressure is 148/92 (abnormal) and his pulse is 63. His respiration is 16 and oxygen saturation is 100%.   Wt Readings from Last 3 Encounters:  06/13/20 182 lb (82.6 kg)  05/16/20 176 lb 12.8 oz (80.2 kg)  04/03/20 184 lb 6.4 oz (83.6 kg)    Physical Exam Vitals reviewed.  HENT:     Head: Normocephalic and atraumatic.  Eyes:     Pupils: Pupils are equal, round, and reactive to light.  Cardiovascular:     Rate and Rhythm: Normal rate and regular rhythm.     Heart sounds: Normal heart sounds.  Pulmonary:     Effort: Pulmonary effort is normal.     Breath sounds: Normal breath sounds.  Abdominal:     General: Bowel sounds are normal.     Palpations: Abdomen is soft.  Musculoskeletal:        General: No tenderness or deformity. Normal range of motion.     Cervical back: Normal range of motion.  Lymphadenopathy:     Cervical: No cervical adenopathy.  Skin:    General: Skin is warm and dry.     Findings: No erythema or rash.  Neurological:     Mental Status: He is alert and oriented to person, place, and time.  Psychiatric:        Behavior: Behavior normal.        Thought Content: Thought content normal.  Judgment: Judgment normal.     Lab Results  Component Value Date   WBC 5.2 06/13/2020   HGB 14.9 06/13/2020   HCT 42.8 06/13/2020   MCV 87.9 06/13/2020   PLT 103 (L) 06/13/2020   No results found for: FERRITIN, IRON, TIBC, UIBC, IRONPCTSAT Lab Results  Component Value Date   RBC 4.87 06/13/2020   No results found for: KPAFRELGTCHN, LAMBDASER, KAPLAMBRATIO No results found for: IGGSERUM, IGA, IGMSERUM No results found for: Odetta Pink, SPEI   Chemistry      Component Value Date/Time   NA 141 06/13/2020 0746   NA 142 07/01/2017 1246   K 3.9 06/13/2020 0746   K 4.1 07/01/2017 1246   CL 102 06/13/2020 0746   CL 94 (L) 09/06/2016 1502   CO2 32 06/13/2020 0746   CO2 30 (H) 07/01/2017 1246   BUN  19 06/13/2020 0746   BUN 12.4 07/01/2017 1246   CREATININE 1.28 (H) 06/13/2020 0746   CREATININE 1.22 09/21/2018 0910   CREATININE 1.1 07/01/2017 1246      Component Value Date/Time   CALCIUM 9.7 06/13/2020 0746   CALCIUM 9.1 07/01/2017 1246   ALKPHOS 46 06/13/2020 0746   ALKPHOS 61 07/01/2017 1246   AST 19 06/13/2020 0746   AST 19 07/01/2017 1246   ALT 13 06/13/2020 0746   ALT 13 07/01/2017 1246   BILITOT 1.2 06/13/2020 0746   BILITOT 0.66 07/01/2017 1246     Impression and Plan: Robert Young is a 44 year old white male. He has relapsed immune thrombocytopenia.  He really has not had a relapse for a year or so.  As such, I knew that he would respond to the Decadron and Nplate.  His platelet count is down a little bit today. We will go ahead and give him a dose of Nplate today.  We will plan to get him back before the Thanksgiving holiday.  He really has done nicely.    Volanda Napoleon, MD 10/15/20218:34 AM

## 2020-06-13 NOTE — Patient Instructions (Signed)
Romiplostim injection What is this medicine? ROMIPLOSTIM (roe mi PLOE stim) helps your body make more platelets. This medicine is used to treat low platelets caused by chronic idiopathic thrombocytopenic purpura (ITP). This medicine may be used for other purposes; ask your health care provider or pharmacist if you have questions. COMMON BRAND NAME(S): Nplate What should I tell my health care provider before I take this medicine? They need to know if you have any of these conditions:  bleeding disorders  bone marrow problem, like blood cancer or myelodysplastic syndrome  history of blood clots  liver disease  surgery to remove your spleen  an unusual or allergic reaction to romiplostim, mannitol, other medicines, foods, dyes, or preservatives  pregnant or trying to get pregnant  breast-feeding How should I use this medicine? This medicine is for injection under the skin. It is given by a health care professional in a hospital or clinic setting. A special MedGuide will be given to you before your injection. Read this information carefully each time. Talk to your pediatrician regarding the use of this medicine in children. While this drug may be prescribed for children as young as 1 year for selected conditions, precautions do apply. Overdosage: If you think you have taken too much of this medicine contact a poison control center or emergency room at once. NOTE: This medicine is only for you. Do not share this medicine with others. What if I miss a dose? It is important not to miss your dose. Call your doctor or health care professional if you are unable to keep an appointment. What may interact with this medicine? Interactions are not expected. This list may not describe all possible interactions. Give your health care provider a list of all the medicines, herbs, non-prescription drugs, or dietary supplements you use. Also tell them if you smoke, drink alcohol, or use illegal drugs.  Some items may interact with your medicine. What should I watch for while using this medicine? Your condition will be monitored carefully while you are receiving this medicine. Visit your prescriber or health care professional for regular checks on your progress and for the needed blood tests. It is important to keep all appointments. What side effects may I notice from receiving this medicine? Side effects that you should report to your doctor or health care professional as soon as possible:  allergic reactions like skin rash, itching or hives, swelling of the face, lips, or tongue  signs and symptoms of bleeding such as bloody or black, tarry stools; red or dark brown urine; spitting up blood or brown material that looks like coffee grounds; red spots on the skin; unusual bruising or bleeding from the eyes, gums, or nose  signs and symptoms of a blood clot such as chest pain; shortness of breath; pain, swelling, or warmth in the leg  signs and symptoms of a stroke like changes in vision; confusion; trouble speaking or understanding; severe headaches; sudden numbness or weakness of the face, arm or leg; trouble walking; dizziness; loss of balance or coordination Side effects that usually do not require medical attention (report to your doctor or health care professional if they continue or are bothersome):  headache  pain in arms and legs  pain in mouth  stomach pain This list may not describe all possible side effects. Call your doctor for medical advice about side effects. You may report side effects to FDA at 1-800-FDA-1088. Where should I keep my medicine? This drug is given in a hospital or clinic   and will not be stored at home. NOTE: This sheet is a summary. It may not cover all possible information. If you have questions about this medicine, talk to your doctor, pharmacist, or health care provider.  2020 Elsevier/Gold Standard (2017-08-15 11:10:55)  

## 2020-06-13 NOTE — Progress Notes (Signed)
Pt discharged in no apparent distress. Pt left ambulatory without assistance. Pt aware of discharge instructions and verbalized understanding and had no further questions.  

## 2020-07-17 ENCOUNTER — Inpatient Hospital Stay: Payer: 59 | Attending: Hematology & Oncology

## 2020-07-17 ENCOUNTER — Inpatient Hospital Stay: Payer: 59

## 2020-07-17 ENCOUNTER — Encounter: Payer: Self-pay | Admitting: Hematology & Oncology

## 2020-07-17 ENCOUNTER — Other Ambulatory Visit: Payer: Self-pay

## 2020-07-17 ENCOUNTER — Telehealth: Payer: Self-pay

## 2020-07-17 ENCOUNTER — Inpatient Hospital Stay (HOSPITAL_BASED_OUTPATIENT_CLINIC_OR_DEPARTMENT_OTHER): Payer: 59 | Admitting: Hematology & Oncology

## 2020-07-17 VITALS — BP 132/80 | HR 59 | Temp 98.2°F | Resp 16 | Wt 179.0 lb

## 2020-07-17 DIAGNOSIS — D693 Immune thrombocytopenic purpura: Secondary | ICD-10-CM | POA: Insufficient documentation

## 2020-07-17 LAB — CMP (CANCER CENTER ONLY)
ALT: 16 U/L (ref 0–44)
AST: 19 U/L (ref 15–41)
Albumin: 4.7 g/dL (ref 3.5–5.0)
Alkaline Phosphatase: 45 U/L (ref 38–126)
Anion gap: 6 (ref 5–15)
BUN: 18 mg/dL (ref 6–20)
CO2: 33 mmol/L — ABNORMAL HIGH (ref 22–32)
Calcium: 9.8 mg/dL (ref 8.9–10.3)
Chloride: 101 mmol/L (ref 98–111)
Creatinine: 1.25 mg/dL — ABNORMAL HIGH (ref 0.61–1.24)
GFR, Estimated: >60 mL/min (ref >60)
Glucose, Bld: 152 mg/dL — ABNORMAL HIGH (ref 70–99)
Potassium: 4.2 mmol/L (ref 3.5–5.1)
Sodium: 140 mmol/L (ref 135–145)
Total Bilirubin: 1.1 mg/dL (ref 0.3–1.2)
Total Protein: 6.8 g/dL (ref 6.5–8.1)

## 2020-07-17 LAB — CBC WITH DIFFERENTIAL (CANCER CENTER ONLY)
Abs Immature Granulocytes: 0 10*3/uL (ref 0.00–0.07)
Basophils Absolute: 0 10*3/uL (ref 0.0–0.1)
Basophils Relative: 0 %
Eosinophils Absolute: 0.4 10*3/uL (ref 0.0–0.5)
Eosinophils Relative: 8 %
HCT: 44.6 % (ref 39.0–52.0)
Hemoglobin: 15.1 g/dL (ref 13.0–17.0)
Immature Granulocytes: 0 %
Lymphocytes Relative: 29 %
Lymphs Abs: 1.4 10*3/uL (ref 0.7–4.0)
MCH: 29.7 pg (ref 26.0–34.0)
MCHC: 33.9 g/dL (ref 30.0–36.0)
MCV: 87.8 fL (ref 80.0–100.0)
Monocytes Absolute: 0.4 10*3/uL (ref 0.1–1.0)
Monocytes Relative: 8 %
Neutro Abs: 2.7 10*3/uL (ref 1.7–7.7)
Neutrophils Relative %: 55 %
Platelet Count: 116 10*3/uL — ABNORMAL LOW (ref 150–400)
RBC: 5.08 MIL/uL (ref 4.22–5.81)
RDW: 11.5 % (ref 11.5–15.5)
WBC Count: 4.8 10*3/uL (ref 4.0–10.5)
nRBC: 0 % (ref 0.0–0.2)

## 2020-07-17 LAB — PLATELET BY CITRATE

## 2020-07-17 MED ORDER — ROMIPLOSTIM 125 MCG ~~LOC~~ SOLR
1.0000 ug/kg | SUBCUTANEOUS | Status: AC
  Filled 2020-07-17: qty 0.16

## 2020-07-17 NOTE — Telephone Encounter (Signed)
Appts made per 11.18.21 and pt req to view appts on mychart and not to be printed... AOM

## 2020-07-17 NOTE — Progress Notes (Signed)
Hematology and Oncology Follow Up Visit  Robert Young [id number] 11-Oct-1975 44 y.o. 07/17/2020   Principle Diagnosis:  Chronic immune thrombocytopenia  - Relapsed  Current Therapy:  Decadron 40 mg po q day x 4 days every 2 weeks - as needed Nplate - dose adjusted per pharmacy - as indicated     Interim History:  Robert Young is here today for follow-up.  So far, everything is going quite well for him.  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  He has had no problem with bleeding or bruising.  Had little bit of a cold last week.  Thought that his platelet count would be down today.  Thankfully, his platelet count was 116,000.  He has had no problems with cough or shortness of breath.  There is been no fever.  He has had no rashes.  He has had no nausea or vomiting.  Overall, his performance status is ECOG 0.    Medications:  Allergies as of 07/17/2020      Reactions   Zyrtec [cetirizine] Hypertension      Medication List    as of July 17, 2020  8:00 AM   You have not been prescribed any medications.     Allergies:  Allergies  Allergen Reactions   Zyrtec [Cetirizine] Hypertension    Past Medical History, Surgical history, Social history, and Family History were reviewed and updated.  Review of Systems: Review of Systems  Constitutional: Negative.   HENT: Negative.   Eyes: Negative.   Respiratory: Negative.   Cardiovascular: Negative.   Gastrointestinal: Negative.   Genitourinary: Negative.   Musculoskeletal: Negative.   Skin: Negative.   Neurological: Negative.   Endo/Heme/Allergies: Negative.   Psychiatric/Behavioral: Negative.      Physical Exam:  vitals were not taken for this visit.   Wt Readings from Last 3 Encounters:  06/13/20 182 lb (82.6 kg)  05/16/20 176 lb 12.8 oz (80.2 kg)  04/03/20 184 lb 6.4 oz (83.6 kg)    Physical Exam Vitals reviewed.  HENT:     Head: Normocephalic and atraumatic.  Eyes:      Pupils: Pupils are equal, round, and reactive to light.  Cardiovascular:     Rate and Rhythm: Normal rate and regular rhythm.     Heart sounds: Normal heart sounds.  Pulmonary:     Effort: Pulmonary effort is normal.     Breath sounds: Normal breath sounds.  Abdominal:     General: Bowel sounds are normal.     Palpations: Abdomen is soft.  Musculoskeletal:        General: No tenderness or deformity. Normal range of motion.     Cervical back: Normal range of motion.  Lymphadenopathy:     Cervical: No cervical adenopathy.  Skin:    General: Skin is warm and dry.     Findings: No erythema or rash.  Neurological:     Mental Status: He is alert and oriented to person, place, and time.  Psychiatric:        Behavior: Behavior normal.        Thought Content: Thought content normal.        Judgment: Judgment normal.     Lab Results  Component Value Date   WBC 4.8 07/17/2020   HGB 15.1 07/17/2020   HCT 44.6 07/17/2020   MCV 87.8 07/17/2020   PLT 116 (L) 07/17/2020   No results found for: FERRITIN, IRON, TIBC, UIBC, IRONPCTSAT  Lab Results  Component Value Date   RBC 5.08 07/17/2020   No results found for: KPAFRELGTCHN, LAMBDASER, KAPLAMBRATIO No results found for: IGGSERUM, IGA, IGMSERUM No results found for: Odetta Pink, SPEI   Chemistry      Component Value Date/Time   NA 141 06/13/2020 0746   NA 142 07/01/2017 1246   K 3.9 06/13/2020 0746   K 4.1 07/01/2017 1246   CL 102 06/13/2020 0746   CL 94 (L) 09/06/2016 1502   CO2 32 06/13/2020 0746   CO2 30 (H) 07/01/2017 1246   BUN 19 06/13/2020 0746   BUN 12.4 07/01/2017 1246   CREATININE 1.28 (H) 06/13/2020 0746   CREATININE 1.22 09/21/2018 0910   CREATININE 1.1 07/01/2017 1246      Component Value Date/Time   CALCIUM 9.7 06/13/2020 0746   CALCIUM 9.1 07/01/2017 1246   ALKPHOS 46 06/13/2020 0746   ALKPHOS 61 07/01/2017 1246   AST 19 06/13/2020 0746   AST 19  07/01/2017 1246   ALT 13 06/13/2020 0746   ALT 13 07/01/2017 1246   BILITOT 1.2 06/13/2020 0746   BILITOT 0.66 07/01/2017 1246     Impression and Plan: Robert Young is a 44 year old white male. He has relapsed immune thrombocytopenia.  He is doing quite well right now.  We do not have to give him Nplate today.  We will not get him through all the holiday season.  I do not think we have to see him back until January.  It is very happy that his platelet count is holding nice and stable.    Volanda Napoleon, MD 11/18/20218:00 AM

## 2020-09-16 ENCOUNTER — Encounter: Payer: Self-pay | Admitting: Hematology & Oncology

## 2020-09-16 ENCOUNTER — Other Ambulatory Visit: Payer: 59

## 2020-09-16 ENCOUNTER — Ambulatory Visit: Payer: 59 | Admitting: Hematology & Oncology

## 2020-09-17 ENCOUNTER — Inpatient Hospital Stay: Payer: 59

## 2020-09-17 ENCOUNTER — Inpatient Hospital Stay: Payer: 59 | Admitting: Hematology & Oncology

## 2020-09-18 ENCOUNTER — Encounter: Payer: Self-pay | Admitting: Hematology & Oncology

## 2020-09-18 ENCOUNTER — Telehealth: Payer: Self-pay

## 2020-09-18 NOTE — Telephone Encounter (Signed)
Called pt per inbasket message and r.s his appt    [facility]

## 2020-09-23 ENCOUNTER — Ambulatory Visit: Payer: 59 | Admitting: Hematology & Oncology

## 2020-09-23 ENCOUNTER — Other Ambulatory Visit: Payer: 59

## 2020-09-25 ENCOUNTER — Other Ambulatory Visit: Payer: Self-pay

## 2020-09-25 ENCOUNTER — Inpatient Hospital Stay (HOSPITAL_BASED_OUTPATIENT_CLINIC_OR_DEPARTMENT_OTHER): Payer: 59 | Admitting: Hematology & Oncology

## 2020-09-25 ENCOUNTER — Inpatient Hospital Stay: Payer: 59 | Attending: Hematology & Oncology

## 2020-09-25 ENCOUNTER — Encounter: Payer: Self-pay | Admitting: Hematology & Oncology

## 2020-09-25 VITALS — BP 138/71 | HR 60 | Temp 97.6°F | Resp 18 | Wt 180.0 lb

## 2020-09-25 DIAGNOSIS — D693 Immune thrombocytopenic purpura: Secondary | ICD-10-CM | POA: Diagnosis not present

## 2020-09-25 DIAGNOSIS — Z79899 Other long term (current) drug therapy: Secondary | ICD-10-CM | POA: Insufficient documentation

## 2020-09-25 LAB — CMP (CANCER CENTER ONLY)
ALT: 13 U/L (ref 0–44)
AST: 19 U/L (ref 15–41)
Albumin: 4.7 g/dL (ref 3.5–5.0)
Alkaline Phosphatase: 42 U/L (ref 38–126)
Anion gap: 7 (ref 5–15)
BUN: 17 mg/dL (ref 6–20)
CO2: 32 mmol/L (ref 22–32)
Calcium: 9.5 mg/dL (ref 8.9–10.3)
Chloride: 102 mmol/L (ref 98–111)
Creatinine: 1.22 mg/dL (ref 0.61–1.24)
GFR, Estimated: >60 mL/min (ref >60)
Glucose, Bld: 67 mg/dL — ABNORMAL LOW (ref 70–99)
Potassium: 3.8 mmol/L (ref 3.5–5.1)
Sodium: 141 mmol/L (ref 135–145)
Total Bilirubin: 1 mg/dL (ref 0.3–1.2)
Total Protein: 6.5 g/dL (ref 6.5–8.1)

## 2020-09-25 LAB — CBC WITH DIFFERENTIAL (CANCER CENTER ONLY)
Abs Immature Granulocytes: 0.01 10*3/uL (ref 0.00–0.07)
Basophils Absolute: 0 10*3/uL (ref 0.0–0.1)
Basophils Relative: 0 %
Eosinophils Absolute: 0.5 10*3/uL (ref 0.0–0.5)
Eosinophils Relative: 10 %
HCT: 42.8 % (ref 39.0–52.0)
Hemoglobin: 14.7 g/dL (ref 13.0–17.0)
Immature Granulocytes: 0 %
Lymphocytes Relative: 29 %
Lymphs Abs: 1.3 10*3/uL (ref 0.7–4.0)
MCH: 30.4 pg (ref 26.0–34.0)
MCHC: 34.3 g/dL (ref 30.0–36.0)
MCV: 88.6 fL (ref 80.0–100.0)
Monocytes Absolute: 0.4 10*3/uL (ref 0.1–1.0)
Monocytes Relative: 10 %
Neutro Abs: 2.3 10*3/uL (ref 1.7–7.7)
Neutrophils Relative %: 51 %
Platelet Count: 147 10*3/uL — ABNORMAL LOW (ref 150–400)
RBC: 4.83 MIL/uL (ref 4.22–5.81)
RDW: 11.9 % (ref 11.5–15.5)
WBC Count: 4.5 10*3/uL (ref 4.0–10.5)
nRBC: 0 % (ref 0.0–0.2)

## 2020-09-25 LAB — PLATELET BY CITRATE

## 2020-09-25 LAB — SAVE SMEAR(SSMR), FOR PROVIDER SLIDE REVIEW

## 2020-09-25 NOTE — Progress Notes (Signed)
Hematology and Oncology Follow Up Visit  Robert Young [id number] Jan 31, 1976 45 y.o. 09/25/2020   Principle Diagnosis:  Chronic immune thrombocytopenia  - Relapsed  Current Therapy:  Decadron 40 mg po q day x 4 days every 2 weeks - as needed Nplate - dose adjusted per pharmacy - as indicated     Interim History:  Mr. Dorris is here today for follow-up.  The bad news is that his wife was found to have breast cancer.  Thankfully, it is early stage.  She is getting aggressive therapy.  I know that she will do fine.  He is able to take off from work for a little bit to be able to help her out which is certainly nice to see.  He has had no problems with the ITP.  His platelet count is 147,000 today.  This certainly is quite good for him.  He has had no problems with bleeding or bruising.  He has had no fever.  He has had no issues with Covid.  He has had no nausea or vomiting.  He is still trying to exercise.  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  As always he is quite busy over there.  Overall, his performance status is ECOG 0.      Medications:  Allergies as of 09/25/2020      Reactions   Zyrtec [cetirizine] Hypertension      Medication List    as of September 25, 2020  8:03 AM   You have not been prescribed any medications.     Allergies:  Allergies  Allergen Reactions  . Zyrtec [Cetirizine] Hypertension    Past Medical History, Surgical history, Social history, and Family History were reviewed and updated.  Review of Systems: Review of Systems  Constitutional: Negative.   HENT: Negative.   Eyes: Negative.   Respiratory: Negative.   Cardiovascular: Negative.   Gastrointestinal: Negative.   Genitourinary: Negative.   Musculoskeletal: Negative.   Skin: Negative.   Neurological: Negative.   Endo/Heme/Allergies: Negative.   Psychiatric/Behavioral: Negative.      Physical Exam:  vitals were not taken for this visit.   Wt Readings  from Last 3 Encounters:  07/17/20 179 lb (81.2 kg)  06/13/20 182 lb (82.6 kg)  05/16/20 176 lb 12.8 oz (80.2 kg)    Physical Exam Vitals reviewed.  HENT:     Head: Normocephalic and atraumatic.  Eyes:     Pupils: Pupils are equal, round, and reactive to light.  Cardiovascular:     Rate and Rhythm: Normal rate and regular rhythm.     Heart sounds: Normal heart sounds.  Pulmonary:     Effort: Pulmonary effort is normal.     Breath sounds: Normal breath sounds.  Abdominal:     General: Bowel sounds are normal.     Palpations: Abdomen is soft.  Musculoskeletal:        General: No tenderness or deformity. Normal range of motion.     Cervical back: Normal range of motion.  Lymphadenopathy:     Cervical: No cervical adenopathy.  Skin:    General: Skin is warm and dry.     Findings: No erythema or rash.  Neurological:     Mental Status: He is alert and oriented to person, place, and time.  Psychiatric:        Behavior: Behavior normal.        Thought Content: Thought content normal.  Judgment: Judgment normal.     Lab Results  Component Value Date   WBC 4.5 09/25/2020   HGB 14.7 09/25/2020   HCT 42.8 09/25/2020   MCV 88.6 09/25/2020   PLT 147 (L) 09/25/2020   No results found for: FERRITIN, IRON, TIBC, UIBC, IRONPCTSAT Lab Results  Component Value Date   RBC 4.83 09/25/2020   No results found for: KPAFRELGTCHN, LAMBDASER, KAPLAMBRATIO No results found for: IGGSERUM, IGA, IGMSERUM No results found for: Odetta Pink, SPEI   Chemistry      Component Value Date/Time   NA 140 07/17/2020 0736   NA 142 07/01/2017 1246   K 4.2 07/17/2020 0736   K 4.1 07/01/2017 1246   CL 101 07/17/2020 0736   CL 94 (L) 09/06/2016 1502   CO2 33 (H) 07/17/2020 0736   CO2 30 (H) 07/01/2017 1246   BUN 18 07/17/2020 0736   BUN 12.4 07/01/2017 1246   CREATININE 1.25 (H) 07/17/2020 0736   CREATININE 1.22 09/21/2018 0910    CREATININE 1.1 07/01/2017 1246      Component Value Date/Time   CALCIUM 9.8 07/17/2020 0736   CALCIUM 9.1 07/01/2017 1246   ALKPHOS 45 07/17/2020 0736   ALKPHOS 61 07/01/2017 1246   AST 19 07/17/2020 0736   AST 19 07/01/2017 1246   ALT 16 07/17/2020 0736   ALT 13 07/01/2017 1246   BILITOT 1.1 07/17/2020 0736   BILITOT 0.66 07/01/2017 1246     Impression and Plan: Mr. Agena is a 45 year old white male. He has relapsed immune thrombocytopenia.  He is doing quite well right now.  We do not have to give him Nplate today.  We have not had to give him nplate for many months now.  I think we can start to move his appointments out little bit more.  Given that his wife is dealing with her situation, I really do not want to make it more difficult for him to come here if not needed.  I will have blood work done only in 2 months.  I will see him back in 4 months.  Volanda Napoleon, MD 1/27/20228:03 AM

## 2020-11-24 ENCOUNTER — Inpatient Hospital Stay: Payer: 59

## 2020-11-24 ENCOUNTER — Inpatient Hospital Stay: Payer: 59 | Attending: Hematology & Oncology

## 2020-11-24 ENCOUNTER — Encounter: Payer: Self-pay | Admitting: Hematology & Oncology

## 2020-11-24 ENCOUNTER — Other Ambulatory Visit: Payer: Self-pay

## 2020-11-24 ENCOUNTER — Other Ambulatory Visit: Payer: Self-pay | Admitting: *Deleted

## 2020-11-24 DIAGNOSIS — D693 Immune thrombocytopenic purpura: Secondary | ICD-10-CM | POA: Diagnosis not present

## 2020-11-24 LAB — CBC WITH DIFFERENTIAL (CANCER CENTER ONLY)
Abs Immature Granulocytes: 0.01 10*3/uL (ref 0.00–0.07)
Basophils Absolute: 0 10*3/uL (ref 0.0–0.1)
Basophils Relative: 0 %
Eosinophils Absolute: 0.4 10*3/uL (ref 0.0–0.5)
Eosinophils Relative: 7 %
HCT: 42.9 % (ref 39.0–52.0)
Hemoglobin: 14.8 g/dL (ref 13.0–17.0)
Immature Granulocytes: 0 %
Lymphocytes Relative: 27 %
Lymphs Abs: 1.4 10*3/uL (ref 0.7–4.0)
MCH: 30.3 pg (ref 26.0–34.0)
MCHC: 34.5 g/dL (ref 30.0–36.0)
MCV: 87.7 fL (ref 80.0–100.0)
Monocytes Absolute: 0.4 10*3/uL (ref 0.1–1.0)
Monocytes Relative: 8 %
Neutro Abs: 3 10*3/uL (ref 1.7–7.7)
Neutrophils Relative %: 58 %
Platelet Count: 151 10*3/uL (ref 150–400)
RBC: 4.89 MIL/uL (ref 4.22–5.81)
RDW: 11.9 % (ref 11.5–15.5)
WBC Count: 5.2 10*3/uL (ref 4.0–10.5)
nRBC: 0 % (ref 0.0–0.2)

## 2020-11-24 LAB — CMP (CANCER CENTER ONLY)
ALT: 16 U/L (ref 0–44)
AST: 22 U/L (ref 15–41)
Albumin: 4.7 g/dL (ref 3.5–5.0)
Alkaline Phosphatase: 49 U/L (ref 38–126)
Anion gap: 10 (ref 5–15)
BUN: 18 mg/dL (ref 6–20)
CO2: 29 mmol/L (ref 22–32)
Calcium: 8.6 mg/dL — ABNORMAL LOW (ref 8.9–10.3)
Chloride: 103 mmol/L (ref 98–111)
Creatinine: 1.2 mg/dL (ref 0.61–1.24)
GFR, Estimated: >60 mL/min (ref >60)
Glucose, Bld: 82 mg/dL (ref 70–99)
Potassium: 4 mmol/L (ref 3.5–5.1)
Sodium: 142 mmol/L (ref 135–145)
Total Bilirubin: 0.9 mg/dL (ref 0.3–1.2)
Total Protein: 6.7 g/dL (ref 6.5–8.1)

## 2020-11-24 LAB — PLATELET BY CITRATE

## 2020-11-24 LAB — SAVE SMEAR(SSMR), FOR PROVIDER SLIDE REVIEW

## 2021-01-09 ENCOUNTER — Encounter: Payer: 59 | Admitting: Osteopathic Medicine

## 2021-01-15 ENCOUNTER — Encounter: Payer: Self-pay | Admitting: Osteopathic Medicine

## 2021-01-15 ENCOUNTER — Other Ambulatory Visit: Payer: Self-pay

## 2021-01-15 ENCOUNTER — Ambulatory Visit (INDEPENDENT_AMBULATORY_CARE_PROVIDER_SITE_OTHER): Payer: 59 | Admitting: Osteopathic Medicine

## 2021-01-15 VITALS — BP 127/79 | HR 55 | Temp 98.2°F | Wt 174.0 lb

## 2021-01-15 DIAGNOSIS — D693 Immune thrombocytopenic purpura: Secondary | ICD-10-CM

## 2021-01-15 DIAGNOSIS — Z0000 Encounter for general adult medical examination without abnormal findings: Secondary | ICD-10-CM

## 2021-01-15 MED ORDER — AMBULATORY NON FORMULARY MEDICATION: 0 refills | Status: DC

## 2021-01-15 NOTE — Progress Notes (Signed)
Robert Young is a 45 y.o. male who presents to  [facility]  today, 01/15/21, seeking care for the following:  . Annual physical      ASSESSMENT & PLAN with other pertinent findings:  The primary encounter diagnosis was Annual physical exam. A diagnosis of Chronic ITP (idiopathic thrombocytopenia) (HCC) was also pertinent to this visit.    Patient Instructions  General Preventive Care  Most recent routine screening labs: see attached order for cholesterol lab.   Blood pressure goal 130/80 or less.   Tobacco: don't!   Alcohol: responsible moderation is ok for most adults - if you have concerns about your alcohol intake, please talk to me!   Exercise: as tolerated to reduce risk of cardiovascular disease and diabetes. Strength training will also prevent osteoporosis.   Mental health: if need for mental health care (medicines, counseling, other), or concerns about moods, please let me know!   Sexual / Reproductive health: if need for STI testing, or if concerns with libido/pain problems, please let me know! If you need to discuss family planning, please let me know!   Advanced Directive: Living Will and/or Healthcare Power of Attorney recommended for all adults, regardless of age or health.  Vaccines  Flu vaccine: for almost everyone, every fall.   Shingles vaccine: after age 35.   Pneumonia vaccines: after age 75.  Tetanus booster: every 10 years - due 2030  COVID vaccine: STRONGLY RECOMMENDED  Cancer screenings   Colon cancer screening: for everyone age 12+  Prostate cancer screening: PSA blood test age 70+.   Lung cancer screening: not needed for non-smokers   Infection screenings  . HIV: recommended screening at least once age 36-65, more often as needed. [facility], other STI: screening as needed . Hepatitis C: recommended once for everyone age 13-75 . TB: certain at-risk populations, or  depending on work requirements and/or travel history   No orders of the defined types were placed in this encounter.   Meds ordered this encounter  Medications  . AMBULATORY NON FORMULARY MEDICATION    Sig: Please draw Lipid Panel in addition to usual hematology labs and send results to [facility] office fax [phone], thanks!    Dispense:  1 Units    Refill:  0     See below for relevant physical exam findings  See below for recent lab and imaging results reviewed  Medications, allergies, PMH, PSH, SocH, FamH reviewed below    Follow-up instructions: Return in about 1 year (around 01/15/2022) for [place] (can call week prior to visit for lab orders).                                        Exam:  BP 127/79 (BP Location: Left Arm, Patient Position: Sitting, Cuff Size: Normal)   Pulse (!) 55   Temp 98.2 F (36.8 C) (Oral)   Wt 174 lb 0.6 oz (78.9 kg)   BMI 25.70 kg/m   Constitutional: VS see above. General Appearance: alert, well-developed, well-nourished, NAD  Neck: No masses, trachea midline.   Respiratory: Normal respiratory effort. no wheeze, no rhonchi, no rales  Cardiovascular: S1/S2 normal, no murmur, no rub/gallop auscultated. RRR.   Musculoskeletal: Gait normal. Symmetric and independent movement of all extremities  Abdominal: non-tender, non-distended, no appreciable organomegaly, neg Murphy's, BS WNLx4  Neurological: Normal balance/coordination. No tremor.  Skin: warm, dry, intact.   Psychiatric: Normal judgment/insight. Normal mood and affect. Oriented x3.   Current Meds  Medication Sig  . AMBULATORY NON FORMULARY MEDICATION Please draw Lipid Panel in addition to usual hematology labs and send results to [facility] office fax [phone], thanks!  [name] loratadine (CLARITIN) 10 MG tablet Take 10 mg by mouth daily.  . Triamcinolone Acetonide (NASACORT ALLERGY 24HR NA) Place into the nose.     Allergies  Allergen Reactions  . Zyrtec [Cetirizine] Hypertension    Patient Active Problem List   Diagnosis Date Noted  . Cellulitis 04/01/2020  . Left lower lobe pneumonia 08/24/2017  . Persistent cough for 3 weeks or longer 08/19/2017  . Chronic ITP (idiopathic thrombocytopenia) (HCC) 07/28/2015  . Allergic rhinitis 03/30/2010    Family History  Problem Relation Age of Onset  . Heart disease Father   . Hyperlipidemia Father   . Hypertension Father   . Stroke Father        at age 78's   . Heart disease Paternal Uncle   . Cancer Paternal Grandmother        breast    Social History   Tobacco Use  Smoking Status Never Smoker  Smokeless Tobacco Former Systems developer  . Types: Chew  Tobacco Comment   NEVER USED TOBACCO    Past Surgical History:  Procedure Laterality Date  . TONSILLECTOMY    . TONSILLECTOMY AND ADENOIDECTOMY      Immunization History  Administered Date(s) Administered  . Influenza Split 05/28/2013  . Influenza-Unspecified 11/04/2014, 06/04/2015, 06/15/2016, 05/30/2018  . Tdap 04/19/2008, 09/21/2018    Recent Results (from the past 2160 hour(s))  CBC with Differential (Cancer Center Only)     Status: None   Collection Time: 11/24/20 11:53 AM  Result Value Ref Range   WBC Count 5.2 4.0 - 10.5 K/uL   RBC 4.89 4.22 - 5.81 MIL/uL   Hemoglobin 14.8 13.0 - 17.0 g/dL   HCT 42.9 39.0 - 52.0 %   MCV 87.7 80.0 - 100.0 fL   MCH 30.3 26.0 - 34.0 pg   MCHC 34.5 30.0 - 36.0 g/dL   RDW 11.9 11.5 - 15.5 %   Platelet Count 151 150 - 400 K/uL    Comment: EDTA platelet count consistent with citrate.   nRBC 0.0 0.0 - 0.2 %   Neutrophils Relative % 58 %   Neutro Abs 3.0 1.7 - 7.7 K/uL   Lymphocytes Relative 27 %   Lymphs Abs 1.4 0.7 - 4.0 K/uL   Monocytes Relative 8 %   Monocytes Absolute 0.4 0.1 - 1.0 K/uL   Eosinophils Relative 7 %   Eosinophils Absolute 0.4 0.0 - 0.5 K/uL   Basophils Relative 0 %   Basophils Absolute 0.0 0.0 - 0.1 K/uL   Immature  Granulocytes 0 %   Abs Immature Granulocytes 0.01 0.00 - 0.07 K/uL    Comment: Performed at [facility], [place] [address], [place], [place] [address]  Platelet by Citrate     Status: None   Collection Time: 11/24/20 11:53 AM  Result Value Ref Range   Platelet CT in Citrate EDTA platelet count consistent with citrate.     Comment: Performed at [facility], [place] [address], [place], [place] [address]  CMP ([place] only)     Status: Abnormal   Collection Time: 11/24/20 11:53 AM  Result Value Ref Range   Sodium 142 135 - 145 mmol/L   Potassium 4.0  3.5 - 5.1 mmol/L   Chloride 103 98 - 111 mmol/L   CO2 29 22 - 32 mmol/L   Glucose, Bld 82 70 - 99 mg/dL    Comment: Glucose reference range applies only to samples taken after fasting for at least 8 hours.   BUN 18 6 - 20 mg/dL   Creatinine 1.20 0.61 - 1.24 mg/dL   Calcium 8.6 (L) 8.9 - 10.3 mg/dL   Total Protein 6.7 6.5 - 8.1 g/dL   Albumin 4.7 3.5 - 5.0 g/dL   AST 22 15 - 41 U/L   ALT 16 0 - 44 U/L   Alkaline Phosphatase 49 38 - 126 U/L   Total Bilirubin 0.9 0.3 - 1.2 mg/dL   GFR, Estimated >60 >60 mL/min    Comment: (NOTE) Calculated using the CKD-EPI Creatinine Equation (2021)    Anion gap 10 5 - 15    Comment: Performed at [facility], [place] [address] [place], [place] [address]  Save Smear [facility])     Status: None   Collection Time: 11/24/20 11:53 AM  Result Value Ref Range   Smear Review SMEAR STAINED AND AVAILABLE FOR REVIEW     Comment: Performed at [facility], [address], [place],  [address]    No results found.     All questions at time of visit were answered - patient instructed to contact office with any additional concerns or updates. ER/RTC precautions were reviewed with the patient as applicable.   Please note: manual typing as well as voice recognition software may have been used to produce this  document - typos may escape review. Please contact Robert. Sheppard Coil for any needed clarifications.

## 2021-01-15 NOTE — Patient Instructions (Signed)
General Preventive Care  Most recent routine screening labs: see attached order for cholesterol lab.   Blood pressure goal 130/80 or less.   Tobacco: don't!   Alcohol: responsible moderation is ok for most adults - if you have concerns about your alcohol intake, please talk to me!   Exercise: as tolerated to reduce risk of cardiovascular disease and diabetes. Strength training will also prevent osteoporosis.   Mental health: if need for mental health care (medicines, counseling, other), or concerns about moods, please let me know!   Sexual / Reproductive health: if need for STI testing, or if concerns with libido/pain problems, please let me know! If you need to discuss family planning, please let me know!   Advanced Directive: Living Will and/or Healthcare Power of Attorney recommended for all adults, regardless of age or health.  Vaccines  Flu vaccine: for almost everyone, every fall.   Shingles vaccine: after age 62.   Pneumonia vaccines: after age 16.  Tetanus booster: every 10 years - due 2030  COVID vaccine: STRONGLY RECOMMENDED  Cancer screenings   Colon cancer screening: for everyone age 68+  Prostate cancer screening: PSA blood test age 57+.   Lung cancer screening: not needed for non-smokers   Infection screenings  . HIV: recommended screening at least once age 66-65, more often as needed. Rondell Reams, other STI: screening as needed . Hepatitis C: recommended once for everyone age 45-75 . TB: certain at-risk populations, or depending on work requirements and/or travel history

## 2021-01-22 ENCOUNTER — Encounter: Payer: Self-pay | Admitting: Hematology & Oncology

## 2021-01-23 ENCOUNTER — Encounter: Payer: Self-pay | Admitting: Hematology & Oncology

## 2021-01-23 ENCOUNTER — Inpatient Hospital Stay: Payer: 59 | Attending: Hematology & Oncology

## 2021-01-23 ENCOUNTER — Inpatient Hospital Stay (HOSPITAL_BASED_OUTPATIENT_CLINIC_OR_DEPARTMENT_OTHER): Payer: 59 | Admitting: Hematology & Oncology

## 2021-01-23 ENCOUNTER — Other Ambulatory Visit: Payer: Self-pay | Admitting: Osteopathic Medicine

## 2021-01-23 ENCOUNTER — Other Ambulatory Visit: Payer: Self-pay

## 2021-01-23 VITALS — BP 130/77 | HR 48 | Temp 98.3°F | Resp 20 | Wt 177.0 lb

## 2021-01-23 DIAGNOSIS — D693 Immune thrombocytopenic purpura: Secondary | ICD-10-CM

## 2021-01-23 DIAGNOSIS — Z8342 Family history of familial hypercholesterolemia: Secondary | ICD-10-CM | POA: Diagnosis not present

## 2021-01-23 DIAGNOSIS — Z13220 Encounter for screening for lipoid disorders: Secondary | ICD-10-CM | POA: Diagnosis not present

## 2021-01-23 LAB — CBC WITH DIFFERENTIAL (CANCER CENTER ONLY)
Abs Immature Granulocytes: 0 10*3/uL (ref 0.00–0.07)
Basophils Absolute: 0 10*3/uL (ref 0.0–0.1)
Basophils Relative: 1 %
Eosinophils Absolute: 0.3 10*3/uL (ref 0.0–0.5)
Eosinophils Relative: 7 %
HCT: 41 % (ref 39.0–52.0)
Hemoglobin: 14.6 g/dL (ref 13.0–17.0)
Immature Granulocytes: 0 %
Lymphocytes Relative: 25 %
Lymphs Abs: 1.1 10*3/uL (ref 0.7–4.0)
MCH: 31.1 pg (ref 26.0–34.0)
MCHC: 35.6 g/dL (ref 30.0–36.0)
MCV: 87.4 fL (ref 80.0–100.0)
Monocytes Absolute: 0.4 10*3/uL (ref 0.1–1.0)
Monocytes Relative: 10 %
Neutro Abs: 2.6 10*3/uL (ref 1.7–7.7)
Neutrophils Relative %: 57 %
Platelet Count: 138 10*3/uL — ABNORMAL LOW (ref 150–400)
RBC: 4.69 MIL/uL (ref 4.22–5.81)
RDW: 11.9 % (ref 11.5–15.5)
WBC Count: 4.4 10*3/uL (ref 4.0–10.5)
nRBC: 0 % (ref 0.0–0.2)

## 2021-01-23 LAB — COMPREHENSIVE METABOLIC PANEL
ALT: 14 U/L (ref 0–44)
AST: 18 U/L (ref 15–41)
Albumin: 4.9 g/dL (ref 3.5–5.0)
Alkaline Phosphatase: 40 U/L (ref 38–126)
Anion gap: 7 (ref 5–15)
BUN: 16 mg/dL (ref 6–20)
CO2: 34 mmol/L — ABNORMAL HIGH (ref 22–32)
Calcium: 9.8 mg/dL (ref 8.9–10.3)
Chloride: 102 mmol/L (ref 98–111)
Creatinine, Ser: 1.23 mg/dL (ref 0.61–1.24)
GFR, Estimated: >60 mL/min (ref >60)
Glucose, Bld: 98 mg/dL (ref 70–99)
Potassium: 4.3 mmol/L (ref 3.5–5.1)
Sodium: 143 mmol/L (ref 135–145)
Total Bilirubin: 1.3 mg/dL — ABNORMAL HIGH (ref 0.3–1.2)
Total Protein: 6.5 g/dL (ref 6.5–8.1)

## 2021-01-23 LAB — PLATELET BY CITRATE

## 2021-01-23 NOTE — Progress Notes (Signed)
Hematology and Oncology Follow Up Visit  Robert Young [id number] 21-Jan-1976 45 y.o. 01/23/2021   Principle Diagnosis:  Chronic immune thrombocytopenia  - Relapsed  Current Therapy:  Decadron 40 mg po q day x 4 days every 2 weeks - as needed Nplate - dose adjusted per pharmacy - as indicated     Interim History:  Robert Young is here today for follow-up.  He is doing quite well.  His wife is doing well with respect to her breast cancer.  She is on neoadjuvant therapy.  I am so happy that she is doing well.  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  He has had no problems with bleeding or bruising.  He has had no problems with cough or shortness of breath.  He has had no nausea or vomiting.  Sound like he will have a very nice Memorial Day weekend.  There is been no issues with rashes.  He has had no change in bowel or bladder habits.    Medications:  Allergies as of 01/23/2021      Reactions   Zyrtec [cetirizine] Hypertension      Medication List       Accurate as of Jan 23, 2021  8:53 AM. If you have any questions, ask your nurse or doctor.        STOP taking these medications   AMBULATORY NON FORMULARY MEDICATION Stopped by: Volanda Napoleon, MD     TAKE these medications   loratadine 10 MG tablet Commonly known as: CLARITIN Take 10 mg by mouth daily.   NASACORT ALLERGY 24HR NA Place into the nose as needed.       Allergies:  Allergies  Allergen Reactions  . Zyrtec [Cetirizine] Hypertension    Past Medical History, Surgical history, Social history, and Family History were reviewed and updated.  Review of Systems: Review of Systems  Constitutional: Negative.   HENT: Negative.   Eyes: Negative.   Respiratory: Negative.   Cardiovascular: Negative.   Gastrointestinal: Negative.   Genitourinary: Negative.   Musculoskeletal: Negative.   Skin: Negative.   Neurological: Negative.   Endo/Heme/Allergies: Negative.    Psychiatric/Behavioral: Negative.      Physical Exam:  weight is 177 lb (80.3 kg). His oral temperature is 98.3 F (36.8 C). His blood pressure is 130/77 and his pulse is 48 (abnormal). His respiration is 20 and oxygen saturation is 100%.   Wt Readings from Last 3 Encounters:  01/23/21 177 lb (80.3 kg)  01/15/21 174 lb 0.6 oz (78.9 kg)  09/25/20 180 lb (81.6 kg)    Physical Exam Vitals reviewed.  HENT:     Head: Normocephalic and atraumatic.  Eyes:     Pupils: Pupils are equal, round, and reactive to light.  Cardiovascular:     Rate and Rhythm: Normal rate and regular rhythm.     Heart sounds: Normal heart sounds.  Pulmonary:     Effort: Pulmonary effort is normal.     Breath sounds: Normal breath sounds.  Abdominal:     General: Bowel sounds are normal.     Palpations: Abdomen is soft.  Musculoskeletal:        General: No tenderness or deformity. Normal range of motion.     Cervical back: Normal range of motion.  Lymphadenopathy:     Cervical: No cervical adenopathy.  Skin:    General: Skin is warm and dry.     Findings: No erythema or rash.  Neurological:     Mental Status: He is alert and oriented to person, place, and time.  Psychiatric:        Behavior: Behavior normal.        Thought Content: Thought content normal.        Judgment: Judgment normal.     Lab Results  Component Value Date   WBC 4.4 01/23/2021   HGB 14.6 01/23/2021   HCT 41.0 01/23/2021   MCV 87.4 01/23/2021   PLT 138 (L) 01/23/2021   No results found for: FERRITIN, IRON, TIBC, UIBC, IRONPCTSAT Lab Results  Component Value Date   RBC 4.69 01/23/2021   No results found for: KPAFRELGTCHN, LAMBDASER, KAPLAMBRATIO No results found for: Kandis Cocking, IGMSERUM No results found for: Odetta Pink, SPEI   Chemistry      Component Value Date/Time   NA 143 01/23/2021 0813   NA 142 07/01/2017 1246   K 4.3 01/23/2021 0813   K 4.1  07/01/2017 1246   CL 102 01/23/2021 0813   CL 94 (L) 09/06/2016 1502   CO2 34 (H) 01/23/2021 0813   CO2 30 (H) 07/01/2017 1246   BUN 16 01/23/2021 0813   BUN 12.4 07/01/2017 1246   CREATININE 1.23 01/23/2021 0813   CREATININE 1.20 11/24/2020 1153   CREATININE 1.22 09/21/2018 0910   CREATININE 1.1 07/01/2017 1246      Component Value Date/Time   CALCIUM 9.8 01/23/2021 0813   CALCIUM 9.1 07/01/2017 1246   ALKPHOS 40 01/23/2021 0813   ALKPHOS 61 07/01/2017 1246   AST 18 01/23/2021 0813   AST 22 11/24/2020 1153   AST 19 07/01/2017 1246   ALT 14 01/23/2021 0813   ALT 16 11/24/2020 1153   ALT 13 07/01/2017 1246   BILITOT 1.3 (H) 01/23/2021 0813   BILITOT 0.9 11/24/2020 1153   BILITOT 0.66 07/01/2017 1246     Impression and Plan: Robert Young is a 45 year old white male. He has relapsed immune thrombocytopenia.  He is doing quite well right now.  His platelet count has been holding nice and steady for quite a while.  Hopefully he will maintain his platelet count through the summertime.  I have think we can now get him back after Labor Day.  I want him to make sure that he has time to be able to help his wife.  It sounds like she is doing well.  I am sure that she is getting fantastic care.  I know that her outcome will be good.    Volanda Napoleon, MD 5/27/20228:53 AM

## 2021-01-24 LAB — LIPID PANEL W/O CHOL/HDL RATIO
Cholesterol, Total: 177 mg/dL (ref 100–199)
HDL: 46 mg/dL (ref >39)
LDL Chol Calc (NIH): 114 mg/dL — ABNORMAL HIGH (ref 0–99)
Triglycerides: 91 mg/dL (ref 0–149)
VLDL Cholesterol Cal: 17 mg/dL (ref 5–40)

## 2021-05-15 ENCOUNTER — Inpatient Hospital Stay: Payer: 59 | Attending: Hematology & Oncology

## 2021-05-15 ENCOUNTER — Telehealth: Payer: Self-pay

## 2021-05-15 ENCOUNTER — Other Ambulatory Visit: Payer: Self-pay

## 2021-05-15 ENCOUNTER — Inpatient Hospital Stay: Payer: 59 | Admitting: Hematology & Oncology

## 2021-05-15 VITALS — BP 129/79 | HR 58 | Temp 97.9°F | Resp 20 | Wt 177.4 lb

## 2021-05-15 DIAGNOSIS — D693 Immune thrombocytopenic purpura: Secondary | ICD-10-CM

## 2021-05-15 DIAGNOSIS — Z79899 Other long term (current) drug therapy: Secondary | ICD-10-CM | POA: Diagnosis not present

## 2021-05-15 LAB — CBC WITH DIFFERENTIAL (CANCER CENTER ONLY)
Abs Immature Granulocytes: 0.01 10*3/uL (ref 0.00–0.07)
Basophils Absolute: 0 10*3/uL (ref 0.0–0.1)
Basophils Relative: 0 %
Eosinophils Absolute: 0.3 10*3/uL (ref 0.0–0.5)
Eosinophils Relative: 7 %
HCT: 41.4 % (ref 39.0–52.0)
Hemoglobin: 14.5 g/dL (ref 13.0–17.0)
Immature Granulocytes: 0 %
Lymphocytes Relative: 30 %
Lymphs Abs: 1.4 10*3/uL (ref 0.7–4.0)
MCH: 31 pg (ref 26.0–34.0)
MCHC: 35 g/dL (ref 30.0–36.0)
MCV: 88.7 fL (ref 80.0–100.0)
Monocytes Absolute: 0.4 10*3/uL (ref 0.1–1.0)
Monocytes Relative: 10 %
Neutro Abs: 2.3 10*3/uL (ref 1.7–7.7)
Neutrophils Relative %: 53 %
Platelet Count: 157 10*3/uL (ref 150–400)
RBC: 4.67 MIL/uL (ref 4.22–5.81)
RDW: 11.5 % (ref 11.5–15.5)
WBC Count: 4.5 10*3/uL (ref 4.0–10.5)
nRBC: 0 % (ref 0.0–0.2)

## 2021-05-15 LAB — CMP (CANCER CENTER ONLY)
ALT: 19 U/L (ref 0–44)
AST: 30 U/L (ref 15–41)
Albumin: 4.8 g/dL (ref 3.5–5.0)
Alkaline Phosphatase: 46 U/L (ref 38–126)
Anion gap: 9 (ref 5–15)
BUN: 17 mg/dL (ref 6–20)
CO2: 28 mmol/L (ref 22–32)
Calcium: 9.3 mg/dL (ref 8.9–10.3)
Chloride: 102 mmol/L (ref 98–111)
Creatinine: 1.22 mg/dL (ref 0.61–1.24)
GFR, Estimated: >60 mL/min (ref >60)
Glucose, Bld: 103 mg/dL — ABNORMAL HIGH (ref 70–99)
Potassium: 4.2 mmol/L (ref 3.5–5.1)
Sodium: 139 mmol/L (ref 135–145)
Total Bilirubin: 1 mg/dL (ref 0.3–1.2)
Total Protein: 6.7 g/dL (ref 6.5–8.1)

## 2021-05-15 LAB — SAVE SMEAR(SSMR), FOR PROVIDER SLIDE REVIEW

## 2021-05-15 NOTE — Progress Notes (Signed)
Hematology and Oncology Follow Up Visit  ISSAAC Young JO:[id number] 1975-09-17 45 y.o. 05/15/2021   Principle Diagnosis:  Chronic immune thrombocytopenia  - Relapsed  Current Therapy:  Decadron 40 mg po q day x 4 days every 2 weeks - as needed Nplate - dose adjusted per pharmacy - as indicated     Interim History:  Robert Young is here today for follow-up.  As always, he is quite busy.  He is still working over at [employer].  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  His wife got great news.  She had her mastectomy.  She had a complete response to neoadjuvant chemotherapy.  I am very happy for her.  He has had no problems with bleeding or bruising.  There is been no problems with cough or shortness of breath.  He has had no change in bowel or bladder habits.  There has been no leg swelling.  He has been exercising.  His appetite is doing well.  There has been no nausea or vomiting.  Overall, his performance status is ECOG 0.     Medications:  Allergies as of 05/15/2021       Reactions   Zyrtec [cetirizine] Hypertension        Medication List        Accurate as of May 15, 2021  8:08 AM. If you have any questions, ask your nurse or doctor.          loratadine 10 MG tablet Commonly known as: CLARITIN Take 10 mg by mouth daily.   NASACORT ALLERGY 24HR NA Place into the nose as needed.        Allergies:  Allergies  Allergen Reactions   Zyrtec [Cetirizine] Hypertension    Past Medical History, Surgical history, Social history, and Family History were reviewed and updated.  Review of Systems: Review of Systems  Constitutional: Negative.   HENT: Negative.    Eyes: Negative.   Respiratory: Negative.    Cardiovascular: Negative.   Gastrointestinal: Negative.   Genitourinary: Negative.   Musculoskeletal: Negative.   Skin: Negative.   Neurological: Negative.   Endo/Heme/Allergies: Negative.   Psychiatric/Behavioral: Negative.      Physical  Exam:  weight is 177 lb 6.4 oz (80.5 kg). His oral temperature is 97.9 F (36.6 C). His blood pressure is 129/79 and his pulse is 58 (abnormal). His respiration is 20 and oxygen saturation is 100%.   Wt Readings from Last 3 Encounters:  05/15/21 177 lb 6.4 oz (80.5 kg)  01/23/21 177 lb (80.3 kg)  01/15/21 174 lb 0.6 oz (78.9 kg)    Physical Exam Vitals reviewed.  HENT:     Head: Normocephalic and atraumatic.  Eyes:     Pupils: Pupils are equal, round, and reactive to light.  Cardiovascular:     Rate and Rhythm: Normal rate and regular rhythm.     Heart sounds: Normal heart sounds.  Pulmonary:     Effort: Pulmonary effort is normal.     Breath sounds: Normal breath sounds.  Abdominal:     General: Bowel sounds are normal.     Palpations: Abdomen is soft.  Musculoskeletal:        General: No tenderness or deformity. Normal range of motion.     Cervical back: Normal range of motion.  Lymphadenopathy:     Cervical: No cervical adenopathy.  Skin:    General: Skin is warm and dry.     Findings: No erythema or rash.  Neurological:  Mental Status: He is alert and oriented to person, place, and time.  Psychiatric:        Behavior: Behavior normal.        Thought Content: Thought content normal.        Judgment: Judgment normal.    Lab Results  Component Value Date   WBC 4.5 05/15/2021   HGB 14.5 05/15/2021   HCT 41.4 05/15/2021   MCV 88.7 05/15/2021   PLT 157 05/15/2021   No results found for: FERRITIN, IRON, TIBC, UIBC, IRONPCTSAT Lab Results  Component Value Date   RBC 4.67 05/15/2021   No results found for: KPAFRELGTCHN, LAMBDASER, KAPLAMBRATIO No results found for: Kandis Cocking, IGMSERUM No results found for: Odetta Pink, SPEI   Chemistry      Component Value Date/Time   NA 143 01/23/2021 0813   NA 142 07/01/2017 1246   K 4.3 01/23/2021 0813   K 4.1 07/01/2017 1246   CL 102 01/23/2021 0813   CL 94  (L) 09/06/2016 1502   CO2 34 (H) 01/23/2021 0813   CO2 30 (H) 07/01/2017 1246   BUN 16 01/23/2021 0813   BUN 12.4 07/01/2017 1246   CREATININE 1.23 01/23/2021 0813   CREATININE 1.20 11/24/2020 1153   CREATININE 1.22 09/21/2018 0910   CREATININE 1.1 07/01/2017 1246      Component Value Date/Time   CALCIUM 9.8 01/23/2021 0813   CALCIUM 9.1 07/01/2017 1246   ALKPHOS 40 01/23/2021 0813   ALKPHOS 61 07/01/2017 1246   AST 18 01/23/2021 0813   AST 22 11/24/2020 1153   AST 19 07/01/2017 1246   ALT 14 01/23/2021 0813   ALT 16 11/24/2020 1153   ALT 13 07/01/2017 1246   BILITOT 1.3 (H) 01/23/2021 0813   BILITOT 0.9 11/24/2020 1153   BILITOT 0.66 07/01/2017 1246     Impression and Plan: Robert Young is a 45 year old white male. He has relapsed immune thrombocytopenia.  He is doing quite well right now.  His platelet count has been holding nice and steady for quite a while.  He does not require any intervention.  I think we now get him back in 6 months.  He always knows that he can come back sooner if he has any problems.  If he has any bleeding or bruising he always knows to give [place] a call.    Robert Napoleon, MD 9/16/20228:08 AM

## 2021-05-15 NOTE — Telephone Encounter (Signed)
Appts made per 05/15/21 los, pt req to view on [employer]

## 2021-07-08 DIAGNOSIS — H524 Presbyopia: Secondary | ICD-10-CM | POA: Diagnosis not present

## 2021-11-09 ENCOUNTER — Encounter: Payer: Self-pay | Admitting: Hematology & Oncology

## 2021-11-13 ENCOUNTER — Inpatient Hospital Stay: Payer: 59

## 2021-11-13 ENCOUNTER — Inpatient Hospital Stay: Payer: 59 | Admitting: Hematology & Oncology

## 2021-11-19 ENCOUNTER — Encounter: Payer: Self-pay | Admitting: Hematology & Oncology

## 2021-11-19 ENCOUNTER — Inpatient Hospital Stay (HOSPITAL_BASED_OUTPATIENT_CLINIC_OR_DEPARTMENT_OTHER): Payer: 59 | Admitting: Hematology & Oncology

## 2021-11-19 ENCOUNTER — Telehealth: Payer: Self-pay | Admitting: *Deleted

## 2021-11-19 ENCOUNTER — Inpatient Hospital Stay: Payer: 59 | Attending: Hematology & Oncology

## 2021-11-19 ENCOUNTER — Other Ambulatory Visit: Payer: Self-pay

## 2021-11-19 VITALS — BP 129/78 | HR 62 | Temp 98.0°F | Resp 18 | Ht 70.0 in | Wt 179.1 lb

## 2021-11-19 DIAGNOSIS — Z79899 Other long term (current) drug therapy: Secondary | ICD-10-CM | POA: Insufficient documentation

## 2021-11-19 DIAGNOSIS — D693 Immune thrombocytopenic purpura: Secondary | ICD-10-CM

## 2021-11-19 LAB — CBC WITH DIFFERENTIAL (CANCER CENTER ONLY)
Abs Immature Granulocytes: 0 10*3/uL (ref 0.00–0.07)
Band Neutrophils: 0 %
Basophils Absolute: 0 10*3/uL (ref 0.0–0.1)
Basophils Relative: 0 %
Blasts: 0 %
Eosinophils Absolute: 0.3 10*3/uL (ref 0.0–0.5)
Eosinophils Relative: 5 %
HCT: 41.7 % (ref 39.0–52.0)
Hemoglobin: 14.9 g/dL (ref 13.0–17.0)
Lymphocytes Relative: 19 %
Lymphs Abs: 1 10*3/uL (ref 0.7–4.0)
MCH: 31.6 pg (ref 26.0–34.0)
MCHC: 35.7 g/dL (ref 30.0–36.0)
MCV: 88.5 fL (ref 80.0–100.0)
Metamyelocytes Relative: 0 %
Monocytes Absolute: 0.5 10*3/uL (ref 0.1–1.0)
Monocytes Relative: 9 %
Myelocytes: 0 %
Neutro Abs: 3.2 10*3/uL (ref 1.7–7.7)
Neutrophils Relative %: 67 %
Other: 0 %
Platelet Count: 150 10*3/uL (ref 150–400)
Promyelocytes Relative: 0 %
RBC: 4.71 MIL/uL (ref 4.22–5.81)
RDW: 11.5 % (ref 11.5–15.5)
WBC Count: 5 10*3/uL (ref 4.0–10.5)
nRBC: 0 % (ref 0.0–0.2)
nRBC: 0 /100 WBC

## 2021-11-19 LAB — CMP (CANCER CENTER ONLY)
ALT: 14 U/L (ref 0–44)
AST: 18 U/L (ref 15–41)
Albumin: 4.9 g/dL (ref 3.5–5.0)
Alkaline Phosphatase: 45 U/L (ref 38–126)
Anion gap: 8 (ref 5–15)
BUN: 20 mg/dL (ref 6–20)
CO2: 31 mmol/L (ref 22–32)
Calcium: 9.8 mg/dL (ref 8.9–10.3)
Chloride: 104 mmol/L (ref 98–111)
Creatinine: 1.3 mg/dL — ABNORMAL HIGH (ref 0.61–1.24)
GFR, Estimated: >60 mL/min (ref >60)
Glucose, Bld: 67 mg/dL — ABNORMAL LOW (ref 70–99)
Potassium: 4.2 mmol/L (ref 3.5–5.1)
Sodium: 143 mmol/L (ref 135–145)
Total Bilirubin: 1 mg/dL (ref 0.3–1.2)
Total Protein: 6.5 g/dL (ref 6.5–8.1)

## 2021-11-19 LAB — SAVE SMEAR(SSMR), FOR PROVIDER SLIDE REVIEW

## 2021-11-19 NOTE — Telephone Encounter (Signed)
Per 11/19/21 los - gave upcoming appointments - confirmed ?

## 2021-11-19 NOTE — Progress Notes (Signed)
?Hematology and Oncology Follow Up Visit ? ?SCHNEUR CROWSON ?[id number] ?[birth year] 46 y.o. ?11/19/2021 ? ? ?Principle Diagnosis:  ?Chronic immune thrombocytopenia  - Relapsed ? ?Current Therapy:  ?Decadron 40 mg po q day x 4 days every 2 weeks - as needed ?Nplate - dose adjusted per pharmacy - as indicated ? ?   ?Interim History:  Mr. Granquist is here today for follow-up.  As always, he is doing great.  We see him every 6 months.  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  He says that they are very busy. ? ?He has had no problems with bleeding.  He has had no problems with bruising.  He is staying very active. ? ?His wife is doing well.  She had breast cancer.  Hopefully she has remained in remission. ? ?There has been no change in bowel or bladder habits.  There is been no problems with nausea or vomiting.  He has had no problems with COVID.  There is no problems with leg swelling.  He has had no rashes. ? ?Overall, his performance status is ECOG 0.    ? ? ?Medications:  ?Allergies as of 11/19/2021   ? ?   Reactions  ? Zyrtec [cetirizine] Hypertension  ? ?  ? ?  ?Medication List  ?  ? ?  ? Accurate as of November 19, 2021  8:21 AM. If you have any questions, ask your nurse or doctor.  ?  ?  ? ?  ? ?loratadine 10 MG tablet ?Commonly known as: CLARITIN ?Take 10 mg by mouth daily. ?  ?NASACORT ALLERGY 24HR NA ?Place into the nose as needed. ?  ? ?  ? ? ?Allergies:  ?Allergies  ?Allergen Reactions  ? Zyrtec [Cetirizine] Hypertension  ? ? ?Past Medical History, Surgical history, Social history, and Family History were reviewed and updated. ? ?Review of Systems: ?Review of Systems  ?Constitutional: Negative.   ?HENT: Negative.    ?Eyes: Negative.   ?Respiratory: Negative.    ?Cardiovascular: Negative.   ?Gastrointestinal: Negative.   ?Genitourinary: Negative.   ?Musculoskeletal: Negative.   ?Skin: Negative.   ?Neurological: Negative.   ?Endo/Heme/Allergies: Negative.   ?Psychiatric/Behavioral: Negative.    ? ? ?Physical  Exam: ? height is '5\' 10"'$  (1.778 m) and weight is 179 lb 1.9 oz (81.2 kg). His oral temperature is 98 ?F (36.7 ?C). His blood pressure is 129/78 and his pulse is 62. His respiration is 18 and oxygen saturation is 100%.  ? ?Wt Readings from Last 3 Encounters:  ?11/19/21 179 lb 1.9 oz (81.2 kg)  ?05/15/21 177 lb 6.4 oz (80.5 kg)  ?01/23/21 177 lb (80.3 kg)  ? ? ?Physical Exam ?Vitals reviewed.  ?HENT:  ?   Head: Normocephalic and atraumatic.  ?Eyes:  ?   Pupils: Pupils are equal, round, and reactive to light.  ?Cardiovascular:  ?   Rate and Rhythm: Normal rate and regular rhythm.  ?   Heart sounds: Normal heart sounds.  ?Pulmonary:  ?   Effort: Pulmonary effort is normal.  ?   Breath sounds: Normal breath sounds.  ?Abdominal:  ?   General: Bowel sounds are normal.  ?   Palpations: Abdomen is soft.  ?Musculoskeletal:     ?   General: No tenderness or deformity. Normal range of motion.  ?   Cervical back: Normal range of motion.  ?Lymphadenopathy:  ?   Cervical: No cervical adenopathy.  ?Skin: ?   General: Skin is warm and dry.  ?  Findings: No erythema or rash.  ?Neurological:  ?   Mental Status: He is alert and oriented to person, place, and time.  ?Psychiatric:     ?   Behavior: Behavior normal.     ?   Thought Content: Thought content normal.     ?   Judgment: Judgment normal.  ? ? ?Lab Results  ?Component Value Date  ? WBC 5.0 11/19/2021  ? HGB 14.9 11/19/2021  ? HCT 41.7 11/19/2021  ? MCV 88.5 11/19/2021  ? PLT 150 11/19/2021  ? ?No results found for: FERRITIN, IRON, TIBC, UIBC, IRONPCTSAT ?Lab Results  ?Component Value Date  ? RBC 4.71 11/19/2021  ? ?No results found for: KPAFRELGTCHN, LAMBDASER, KAPLAMBRATIO ?No results found for: IGGSERUM, IGA, IGMSERUM ?No results found for: TOTALPROTELP, ALBUMINELP, A1GS, A2GS, BETS, BETA2SER, GAMS, MSPIKE, SPEI ?  Chemistry   ?   ?Component Value Date/Time  ? NA 139 05/15/2021 0751  ? NA 142 07/01/2017 1246  ? K 4.2 05/15/2021 0751  ? K 4.1 07/01/2017 1246  ? CL 102  05/15/2021 0751  ? CL 94 (L) 09/06/2016 1502  ? CO2 28 05/15/2021 0751  ? CO2 30 (H) 07/01/2017 1246  ? BUN 17 05/15/2021 0751  ? BUN 12.4 07/01/2017 1246  ? CREATININE 1.22 05/15/2021 0751  ? CREATININE 1.22 09/21/2018 0910  ? CREATININE 1.1 07/01/2017 1246  ?    ?Component Value Date/Time  ? CALCIUM 9.3 05/15/2021 0751  ? CALCIUM 9.1 07/01/2017 1246  ? ALKPHOS 46 05/15/2021 0751  ? ALKPHOS 61 07/01/2017 1246  ? AST 30 05/15/2021 0751  ? AST 19 07/01/2017 1246  ? ALT 19 05/15/2021 0751  ? ALT 13 07/01/2017 1246  ? BILITOT 1.0 05/15/2021 0751  ? BILITOT 0.66 07/01/2017 1246  ?  ? ?Impression and Plan: Mr. Fesperman is a 46 year old white male. He has relapsed immune thrombocytopenia. ? ?He is doing quite well right now.  His platelet count has been holding nice and steady for quite a while.  He does not require any intervention. ? ?We will plan for another 68-monthfollow-up.  At some point, we will try to move his appointments out even further. ? ?I do so happy that things are going well for him.  He is very busy.  I am sure that he will have a wonderful summer.  I told him to make sure that he drinks a lot of water and wears sunscreen. ? ?PVolanda Napoleon MD ?3/23/20238:21 AM ? ? ? ?

## 2021-12-01 ENCOUNTER — Ambulatory Visit: Payer: 59 | Admitting: Physician Assistant

## 2021-12-02 ENCOUNTER — Ambulatory Visit: Payer: 59 | Admitting: Physician Assistant

## 2021-12-03 ENCOUNTER — Ambulatory Visit: Payer: 59 | Admitting: Physician Assistant

## 2021-12-03 ENCOUNTER — Encounter: Payer: Self-pay | Admitting: Physician Assistant

## 2021-12-03 VITALS — BP 136/78 | HR 60 | Ht 70.0 in | Wt 176.0 lb

## 2021-12-03 DIAGNOSIS — B079 Viral wart, unspecified: Secondary | ICD-10-CM | POA: Insufficient documentation

## 2021-12-03 NOTE — Patient Instructions (Signed)
Warts ?Warts are small growths on the skin. They are common and can occur on many areas of the body. A person may have one wart or several warts. ?In many cases, warts do not require treatment. They usually go away on their own over a period of many months to a few years. If needed, warts that cause problems or do not go away on their own can be treated. ?What are the causes? ?Warts are caused by a type of virus that is called human papillomavirus (HPV). ?This virus can spread from person to person through direct contact. ?Warts can also spread to other areas of the body when a person scratches a wart and then scratches another area of his or her body. ?What increases the risk? ?You are more likely to develop this condition if: ?You are 53-11 years old. ?You have a weakened body defense system (immune system). ?You are Caucasian. ?What are the signs or symptoms? ?The main symptom of this condition is small growths on the skin. Warts may: ?Be round or oval or have an irregular shape. ?Have a rough surface. ?Range in color from skin color to light yellow, brown, or gray. ?Generally be less than ? inch (1.3 cm) in size. ?Go away and then come back again. ?Most warts are painless, but some can be painful if they are large or occur in an area of the body where pressure will be applied to them, such as the bottom of the foot. ?How is this diagnosed? ?A wart can usually be diagnosed based on its appearance. In some cases, a tissue sample may be removed (biopsy) to be looked at under a microscope. ?How is this treated? ?In many cases, warts do not need treatment. Sometimes treatment is desired. If treatment is needed or desired, options may include: ?Applying medicated solutions, creams, or patches to the wart. These may be over-the-counter or prescription medicines that make the skin soft so that layers will gradually shed away. In many cases, the medicine is applied one or two times per day and covered with a  bandage. ?Putting duct tape over the top of the wart (occlusion). You will leave the tape in place for as long as told by your health care provider and then replace it with a new strip of tape. This is done until the wart goes away. ?Freezing the wart with liquid nitrogen (cryotherapy). ?Burning the wart with: ?Laser treatment. ?An electrified probe (electrocautery). ?Injection of a medicine (Candida antigen) into the wart to help the body's immune system fight off the wart. ?Surgery to remove the wart. ?Follow these instructions at home: ?Medicines ?Apply over-the-counter and prescription medicines only as told by your health care provider. ?Do not apply over-the-counter wart medicines to your face or genitals unless your health care provider tells you to do that. ?Lifestyle ?Keep your immune system healthy. To do this: ?Eat a healthy, balanced diet. ?Get enough sleep. ?Do not use any products that contain nicotine or tobacco, such as cigarettes and e-cigarettes. If you need help quitting, ask your health care provider. ?General instructions ? ?Wash your hands after you touch a wart. ?Do not scratch or pick at a wart. ?Avoid shaving hair that is over a wart. ?Keep all follow-up visits as told by your health care provider. This is important. ?Contact a health care provider if: ?Your warts do not improve after treatment. ?You have redness, swelling, or pain at the site of a wart. ?You have bleeding from a wart that does not stop  with light pressure. ?You have diabetes and you develop a wart. ?Summary ?Warts are small growths on the skin. They are common and can occur on many areas of the body. ?In many cases, warts do not need treatment. Sometimes treatment is desired. If treatment is needed or desired, there are several treatment options. ?Apply over-the-counter and prescription medicines only as told by your health care provider. ?Wash your hands after you touch a wart. ?Keep all follow-up visits as told by your  health care provider. This is important. ?This information is not intended to replace advice given to you by your health care provider. Make sure you discuss any questions you have with your health care provider. ?Document Revised: 01/03/2018 Document Reviewed: 01/03/2018 ?Elsevier Patient Education ? [place] ? ?

## 2021-12-03 NOTE — Progress Notes (Signed)
? ?  Subjective:  ? ? Patient ID: Robert Young, male    DOB: 02/14/1976, 46 y.o.   MRN: [id number] ? ?HPI ?Robert Young is a 46 yo male presenting to the clinic for a suspected wart. ? ?This has been present for 3 months now and is causing the patient pain. Wart is on the bottom of the patient's left foot. Patient has not tried anything at-home remedies for the wart. States he has a history of warts but it has been quite some time since his last one.  ? ?Review of Systems  ?Skin:  Negative for color change and pallor.  ?     Positive for pain  ? ?   ?Objective:  ? Physical Exam ?Skin: ?   General: Skin is warm and dry.  ?   Findings: Lesion present. No erythema or rash.  ?   Comments: Approximately 0.75cm x 1cm wart on the dorsal aspect of the left foot. No signs of infection. Slight tenderness to palpation.  ? ?Cryotherapy Procedure Note ? ?Pre-operative Diagnosis: wart ? ?Post-operative Diagnosis: wart ? ?Locations: dorsal aspect of the left foot ? ?Indications: pain ? ? ?Procedure Details  ?History of allergy to iodine: no. ?Pacemaker? no. ? ?Patient informed of risks (permanent scarring, infection, light or dark discoloration, bleeding, infection, weakness, numbness and recurrence of the lesion) and benefits of the procedure and verbal informed consent obtained. ? ?The areas are treated with liquid nitrogen therapy, frozen until ice ball extended 3 mm beyond lesion, allowed to thaw, and treated again. The patient tolerated procedure well.  The patient was instructed on post-op care, warned that there may be blister formation, redness and pain. Recommend OTC analgesia as needed for pain. ? ?Condition: ?Stable ? ?Complications: ?none. ? ?Plan: ?1. Instructed to keep the area dry and covered for 24-48h and clean thereafter. ?2. Warning signs of infection were reviewed.   ?3. Recommended that the patient use OTC acetaminophen as needed for pain.  ?4. Return in 2 weeks. ? ?   ?Assessment & Plan:  ?..Robert Young was seen today  for warts. ? ?Diagnoses and all orders for this visit: ? ?Warts of foot ? ?Cryotherapy was performed twice on the patient's uncomplicated wart for about 5 seconds each time. Should protect area with bandaid for the rest of the day. Educated on home remedies such as warm sitz baths with a Pumice stone to help improve symptoms. Can also use corn pad to help protect area from further irritation. If pain worsens or does not improve within 2 weeks will want to see back in the office for re-assessment. ?

## 2022-01-15 ENCOUNTER — Encounter: Payer: 59 | Admitting: Osteopathic Medicine

## 2022-01-15 ENCOUNTER — Ambulatory Visit (INDEPENDENT_AMBULATORY_CARE_PROVIDER_SITE_OTHER): Payer: 59 | Admitting: Family Medicine

## 2022-01-15 ENCOUNTER — Encounter: Payer: Self-pay | Admitting: Family Medicine

## 2022-01-15 VITALS — BP 110/66 | HR 46 | Ht 70.0 in | Wt 178.3 lb

## 2022-01-15 DIAGNOSIS — Z13220 Encounter for screening for lipoid disorders: Secondary | ICD-10-CM | POA: Diagnosis not present

## 2022-01-15 DIAGNOSIS — Z0000 Encounter for general adult medical examination without abnormal findings: Secondary | ICD-10-CM

## 2022-01-15 DIAGNOSIS — B079 Viral wart, unspecified: Secondary | ICD-10-CM | POA: Diagnosis not present

## 2022-01-15 NOTE — Patient Instructions (Signed)

## 2022-01-15 NOTE — Assessment & Plan Note (Signed)
Well adult Orders Placed This Encounter  Procedures  . COMPLETE METABOLIC PANEL WITH GFR  . CBC with Differential  . Lipid Panel w/reflex Direct LDL  Screening:  Per lab orders Immunizations; UTD Anticipatory guidance/Risk factor reduction:  Recommendations per AVS

## 2022-01-15 NOTE — Assessment & Plan Note (Signed)
Treated with liquid nitrogen again today.  See procedure note.

## 2022-01-15 NOTE — Progress Notes (Signed)
Robert Young - 46 y.o. male MRN [id number]  Date of birth: Aug 30, 1976  Subjective No chief complaint on file.   HPI Robert Young is a 46 y.o. male here today for annual exam.  He has history of chronic idiopathic ITP, followed by hematology.  This has been stable.   He stays very active and feels like his diet is pretty healthy.   He is a non-smoker.  He consumes EtOH occasionally.    He has a wart on the side of the L foot.  Treated about 6 weeks ago but has not fully resolved.    Review of Systems  Constitutional:  Negative for chills, fever, malaise/fatigue and weight loss.  HENT:  Negative for congestion, ear pain and sore throat.   Eyes:  Negative for blurred vision, double vision and pain.  Respiratory:  Negative for cough and shortness of breath.   Cardiovascular:  Negative for chest pain and palpitations.  Gastrointestinal:  Negative for abdominal pain, blood in stool, constipation, heartburn and nausea.  Genitourinary:  Negative for dysuria and urgency.  Musculoskeletal:  Negative for joint pain and myalgias.  Neurological:  Negative for dizziness and headaches.  Endo/Heme/Allergies:  Does not bruise/bleed easily.  Psychiatric/Behavioral:  Negative for depression. The patient is not nervous/anxious and does not have insomnia.    Allergies  Allergen Reactions   Zyrtec [Cetirizine] Hypertension    Past Medical History:  Diagnosis Date   Allergy    Idiopathic thrombocythemia (Robert Young)    Left lower lobe pneumonia 08/24/2017    Past Surgical History:  Procedure Laterality Date   TONSILLECTOMY     TONSILLECTOMY AND ADENOIDECTOMY      Social History   Socioeconomic History   Marital status: Married    Spouse name: Not on file   Number of children: Not on file   Years of education: Not on file   Highest education level: Not on file  Occupational History   Occupation: physicist   Tobacco Use   Smoking status: Never   Smokeless tobacco: Former    Types: Chew     Quit date: 07/27/2008   Tobacco comments:    NEVER USED TOBACCO  Vaping Use   Vaping Use: Never used  Substance and Sexual Activity   Alcohol use: Yes    Alcohol/week: 0.0 standard drinks    Comment: few drinks per week    Drug use: No   Sexual activity: Yes    Birth control/protection: None  Other Topics Concern   Not on file  Social History Narrative   Not on file   Social Determinants of Health   Financial Resource Strain: Not on file  Food Insecurity: Not on file  Transportation Needs: Not on file  Physical Activity: Not on file  Stress: Not on file  Social Connections: Not on file    Family History  Problem Relation Age of Onset   Heart disease Father    Hyperlipidemia Father    Hypertension Father    Stroke Father        at age 30's    Heart disease Paternal Uncle    Cancer Paternal Grandmother        breast    Health Maintenance  Topic Date Due   INFLUENZA VACCINE  03/30/2022   TETANUS/TDAP  09/21/2028   Hepatitis C Screening  Completed   HIV Screening  Completed   HPV VACCINES  Aged Out     ----------------------------------------------------------------------------------------------------------------------------------------------------------------------------------------------------------------- Physical Exam BP 110/66 (BP Location: Left  Arm, Patient Position: Sitting, Cuff Size: Normal)   Pulse (!) 46   Ht '5\' 10"'$  (1.778 m)   Wt 178 lb 4.8 oz (80.9 kg)   SpO2 100%   BMI 25.58 kg/m   Physical Exam Constitutional:      General: He is not in acute distress. HENT:     Head: Normocephalic and atraumatic.     Right Ear: Tympanic membrane and external ear normal.     Left Ear: Tympanic membrane and external ear normal.  Eyes:     General: No scleral icterus. Neck:     Thyroid: No thyromegaly.  Cardiovascular:     Rate and Rhythm: Normal rate and regular rhythm.     Heart sounds: Normal heart sounds.  Pulmonary:     Effort: Pulmonary  effort is normal.     Breath sounds: Normal breath sounds.  Abdominal:     General: Bowel sounds are normal. There is no distension.     Palpations: Abdomen is soft.     Tenderness: There is no abdominal tenderness. There is no guarding.  Musculoskeletal:     Cervical back: Normal range of motion.  Lymphadenopathy:     Cervical: No cervical adenopathy.  Skin:    General: Skin is warm and dry.     Findings: No rash.  Neurological:     Mental Status: He is alert and oriented to person, place, and time.     Cranial Nerves: No cranial nerve deficit.     Motor: No abnormal muscle tone.  Psychiatric:        Mood and Affect: Mood normal.        Behavior: Behavior normal.   Procedure:  Discussed treatment of wart with liquid nitrogen.  Potential complications reviewed.  Verbal consent given to proceed.  Top layer of keratin pared down scalpel.  Wart on lateral L foot treated with liquid nitrogen for 3 freeze/thaw cycles, achieving a 44m frost ring around the lesion with each cycle. He tolerated this well.  Post procedure instructions given.   ------------------------------------------------------------------------------------------------------------------------------------------------------------------------------------------------------------------- Assessment and Plan  Warts of foot Treated with liquid nitrogen again today.  See procedure note.   Well adult exam Well adult Orders Placed This Encounter  Procedures   COMPLETE METABOLIC PANEL WITH GFR   CBC with Differential   Lipid Panel w/reflex Direct LDL  Screening:  Per lab orders Immunizations; UTD Anticipatory guidance/Risk factor reduction:  Recommendations per AVS   No orders of the defined types were placed in this encounter.   No follow-ups on file.    This visit occurred during the SARS-CoV-2 public health emergency.  Safety protocols were in place, including screening questions prior to the visit, additional usage  of staff PPE, and extensive cleaning of exam room while observing appropriate contact time as indicated for disinfecting solutions.

## 2022-01-16 LAB — COMPLETE METABOLIC PANEL WITH GFR
AG Ratio: 3.4 (calc) — ABNORMAL HIGH (ref 1.0–2.5)
ALT: 14 U/L (ref 9–46)
AST: 16 U/L (ref 10–40)
Albumin: 5.1 g/dL (ref 3.6–5.1)
Alkaline phosphatase (APISO): 48 U/L (ref 36–130)
BUN: 17 mg/dL (ref 7–25)
CO2: 29 mmol/L (ref 20–32)
Calcium: 9.7 mg/dL (ref 8.6–10.3)
Chloride: 104 mmol/L (ref 98–110)
Creat: 1.18 mg/dL (ref 0.60–1.29)
Globulin: 1.5 g/dL (calc) — ABNORMAL LOW (ref 1.9–3.7)
Glucose, Bld: 94 mg/dL (ref 65–99)
Potassium: 4.7 mmol/L (ref 3.5–5.3)
Sodium: 143 mmol/L (ref 135–146)
Total Bilirubin: 1.2 mg/dL (ref 0.2–1.2)
Total Protein: 6.6 g/dL (ref 6.1–8.1)
eGFR: 79 mL/min/{1.73_m2} (ref >=60)

## 2022-01-16 LAB — LIPID PANEL W/REFLEX DIRECT LDL
Cholesterol: 188 mg/dL (ref <200)
HDL: 53 mg/dL (ref >=40)
LDL Cholesterol (Calc): 118 mg/dL (calc) — ABNORMAL HIGH
Non-HDL Cholesterol (Calc): 135 mg/dL (calc) — ABNORMAL HIGH (ref <130)
Total CHOL/HDL Ratio: 3.5 (calc) (ref <5.0)
Triglycerides: 80 mg/dL (ref <150)

## 2022-01-16 LAB — CBC WITH DIFFERENTIAL/PLATELET
Absolute Monocytes: 479 cells/uL (ref 200–950)
Basophils Absolute: 20 cells/uL (ref 0–200)
Basophils Relative: 0.4 %
Eosinophils Absolute: 362 cells/uL (ref 15–500)
Eosinophils Relative: 7.1 %
HCT: 43.8 % (ref 38.5–50.0)
Hemoglobin: 15.1 g/dL (ref 13.2–17.1)
Lymphs Abs: 1454 cells/uL (ref 850–3900)
MCH: 31.5 pg (ref 27.0–33.0)
MCHC: 34.5 g/dL (ref 32.0–36.0)
MCV: 91.3 fL (ref 80.0–100.0)
MPV: 10.5 fL (ref 7.5–12.5)
Monocytes Relative: 9.4 %
Neutro Abs: 2785 cells/uL (ref 1500–7800)
Neutrophils Relative %: 54.6 %
Platelets: 167 10*3/uL (ref 140–400)
RBC: 4.8 10*6/uL (ref 4.20–5.80)
RDW: 12 % (ref 11.0–15.0)
Total Lymphocyte: 28.5 %
WBC: 5.1 10*3/uL (ref 3.8–10.8)

## 2022-03-16 ENCOUNTER — Encounter: Payer: Self-pay | Admitting: Family Medicine

## 2022-03-16 NOTE — Telephone Encounter (Signed)
He can continue th cortisone cream for up to 2 weeks.  If not resolving I would recommend visit.

## 2022-05-18 ENCOUNTER — Encounter: Payer: Self-pay | Admitting: Hematology & Oncology

## 2022-05-20 ENCOUNTER — Inpatient Hospital Stay: Payer: 59

## 2022-05-20 ENCOUNTER — Ambulatory Visit: Payer: 59 | Admitting: Hematology & Oncology

## 2022-05-21 ENCOUNTER — Ambulatory Visit: Payer: 59 | Admitting: Hematology & Oncology

## 2022-05-21 ENCOUNTER — Other Ambulatory Visit: Payer: 59

## 2022-06-14 ENCOUNTER — Other Ambulatory Visit: Payer: Self-pay

## 2022-06-14 ENCOUNTER — Inpatient Hospital Stay: Payer: 59 | Attending: Hematology & Oncology | Admitting: Hematology & Oncology

## 2022-06-14 ENCOUNTER — Inpatient Hospital Stay: Payer: 59

## 2022-06-14 VITALS — BP 123/84 | HR 54 | Temp 98.4°F | Resp 16 | Wt 182.0 lb

## 2022-06-14 DIAGNOSIS — Z79899 Other long term (current) drug therapy: Secondary | ICD-10-CM | POA: Insufficient documentation

## 2022-06-14 DIAGNOSIS — D693 Immune thrombocytopenic purpura: Secondary | ICD-10-CM | POA: Diagnosis not present

## 2022-06-14 LAB — SAVE SMEAR(SSMR), FOR PROVIDER SLIDE REVIEW

## 2022-06-14 LAB — CBC WITH DIFFERENTIAL (CANCER CENTER ONLY)
Abs Immature Granulocytes: 0.01 10*3/uL (ref 0.00–0.07)
Basophils Absolute: 0 10*3/uL (ref 0.0–0.1)
Basophils Relative: 0 %
Eosinophils Absolute: 0.3 10*3/uL (ref 0.0–0.5)
Eosinophils Relative: 6 %
HCT: 43.7 % (ref 39.0–52.0)
Hemoglobin: 14.9 g/dL (ref 13.0–17.0)
Immature Granulocytes: 0 %
Lymphocytes Relative: 27 %
Lymphs Abs: 1.4 10*3/uL (ref 0.7–4.0)
MCH: 30.4 pg (ref 26.0–34.0)
MCHC: 34.1 g/dL (ref 30.0–36.0)
MCV: 89.2 fL (ref 80.0–100.0)
Monocytes Absolute: 0.5 10*3/uL (ref 0.1–1.0)
Monocytes Relative: 10 %
Neutro Abs: 3 10*3/uL (ref 1.7–7.7)
Neutrophils Relative %: 57 %
Platelet Count: 179 10*3/uL (ref 150–400)
RBC: 4.9 MIL/uL (ref 4.22–5.81)
RDW: 11.4 % — ABNORMAL LOW (ref 11.5–15.5)
WBC Count: 5.2 10*3/uL (ref 4.0–10.5)
nRBC: 0 % (ref 0.0–0.2)

## 2022-06-14 LAB — CMP (CANCER CENTER ONLY)
ALT: 15 U/L (ref 0–44)
AST: 18 U/L (ref 15–41)
Albumin: 5 g/dL (ref 3.5–5.0)
Alkaline Phosphatase: 55 U/L (ref 38–126)
Anion gap: 8 (ref 5–15)
BUN: 20 mg/dL (ref 6–20)
CO2: 32 mmol/L (ref 22–32)
Calcium: 9.9 mg/dL (ref 8.9–10.3)
Chloride: 101 mmol/L (ref 98–111)
Creatinine: 1.31 mg/dL — ABNORMAL HIGH (ref 0.61–1.24)
GFR, Estimated: >60 mL/min (ref >60)
Glucose, Bld: 108 mg/dL — ABNORMAL HIGH (ref 70–99)
Potassium: 4.4 mmol/L (ref 3.5–5.1)
Sodium: 141 mmol/L (ref 135–145)
Total Bilirubin: 0.9 mg/dL (ref 0.3–1.2)
Total Protein: 7.1 g/dL (ref 6.5–8.1)

## 2022-06-14 LAB — LACTATE DEHYDROGENASE: LDH: 161 U/L (ref 98–192)

## 2022-06-14 NOTE — Progress Notes (Signed)
Hematology and Oncology Follow Up Visit  Robert Young [id number] [birth year] 46 y.o. 06/14/2022   Principle Diagnosis:  Chronic immune thrombocytopenia  - Relapsed  Current Therapy:  Decadron 40 mg po q day x 4 days every 2 weeks - as needed Nplate - dose adjusted per pharmacy - as indicated     Interim History:  Robert Young is here today for follow-up.  We see him every 6 months.  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  He has had no problems with bleeding or bruising.  He has had no problems with cough or shortness of breath.  He has had no change in bowel or bladder habits.  There is been no swollen lymph nodes.  He has had no issues with rashes.  Currently, I would have to say that his performance status is probably ECOG 0.     Medications:  Allergies as of 06/14/2022       Reactions   Zyrtec [cetirizine] Hypertension        Medication List    as of June 14, 2022  9:02 AM   You have not been prescribed any medications.     Allergies:  Allergies  Allergen Reactions   Zyrtec [Cetirizine] Hypertension    Past Medical History, Surgical history, Social history, and Family History were reviewed and updated.  Review of Systems: Review of Systems  Constitutional: Negative.   HENT: Negative.    Eyes: Negative.   Respiratory: Negative.    Cardiovascular: Negative.   Gastrointestinal: Negative.   Genitourinary: Negative.   Musculoskeletal: Negative.   Skin: Negative.   Neurological: Negative.   Endo/Heme/Allergies: Negative.   Psychiatric/Behavioral: Negative.       Physical Exam:  weight is 182 lb (82.6 kg). His oral temperature is 98.4 F (36.9 C). His blood pressure is 123/84 and his pulse is 54 (abnormal). His respiration is 16 and oxygen saturation is 100%.   Wt Readings from Last 3 Encounters:  06/14/22 182 lb (82.6 kg)  01/15/22 178 lb 4.8 oz (80.9 kg)  12/03/21 176 lb (79.8 kg)     Physical Exam Vitals reviewed.  HENT:     Head: Normocephalic and atraumatic.  Eyes:     Pupils: Pupils are equal, round, and reactive to light.  Cardiovascular:     Rate and Rhythm: Normal rate and regular rhythm.     Heart sounds: Normal heart sounds.  Pulmonary:     Effort: Pulmonary effort is normal.     Breath sounds: Normal breath sounds.  Abdominal:     General: Bowel sounds are normal.     Palpations: Abdomen is soft.  Musculoskeletal:        General: No tenderness or deformity. Normal range of motion.     Cervical back: Normal range of motion.  Lymphadenopathy:     Cervical: No cervical adenopathy.  Skin:    General: Skin is warm and dry.     Findings: No erythema or rash.  Neurological:     Mental Status: He is alert and oriented to person, place, and time.  Psychiatric:        Behavior: Behavior normal.        Thought Content: Thought content normal.        Judgment: Judgment normal.     Lab Results  Component Value Date   WBC 5.2 06/14/2022   HGB 14.9 06/14/2022   HCT 43.7 06/14/2022  MCV 89.2 06/14/2022   PLT 179 06/14/2022   No results found for: "FERRITIN", "IRON", "TIBC", "UIBC", "IRONPCTSAT" Lab Results  Component Value Date   RBC 4.90 06/14/2022   No results found for: "KPAFRELGTCHN", "LAMBDASER", "KAPLAMBRATIO" No results found for: "IGGSERUM", "IGA", "IGMSERUM" No results found for: "TOTALPROTELP", "ALBUMINELP", "A1GS", "A2GS", "BETS", "BETA2SER", "GAMS", "MSPIKE", "SPEI"   Chemistry      Component Value Date/Time   NA 141 06/14/2022 0806   NA 142 07/01/2017 1246   K 4.4 06/14/2022 0806   K 4.1 07/01/2017 1246   CL 101 06/14/2022 0806   CL 94 (L) 09/06/2016 1502   CO2 32 06/14/2022 0806   CO2 30 (H) 07/01/2017 1246   BUN 20 06/14/2022 0806   BUN 12.4 07/01/2017 1246   CREATININE 1.31 (H) 06/14/2022 0806   CREATININE 1.18 01/15/2022 0000   CREATININE 1.1 07/01/2017 1246      Component Value Date/Time   CALCIUM 9.9 06/14/2022  0806   CALCIUM 9.1 07/01/2017 1246   ALKPHOS 55 06/14/2022 0806   ALKPHOS 61 07/01/2017 1246   AST 18 06/14/2022 0806   AST 19 07/01/2017 1246   ALT 15 06/14/2022 0806   ALT 13 07/01/2017 1246   BILITOT 0.9 06/14/2022 0806   BILITOT 0.66 07/01/2017 1246     Impression and Plan: Robert Young is a 46 year old white male. He has relapsed immune thrombocytopenia.  He is doing quite well right now.  His platelet count has been holding nice and steady for quite a while.  He does not require any intervention.  We will continue every 3-monthfollow-up.  I think this is very reasonable for him.  He can was [place] if [place] has a problem.   PVolanda Napoleon MD 10/16/20239:02 AM

## 2022-07-30 DIAGNOSIS — H5203 Hypermetropia, bilateral: Secondary | ICD-10-CM | POA: Diagnosis not present

## 2022-07-30 DIAGNOSIS — H52223 Regular astigmatism, bilateral: Secondary | ICD-10-CM | POA: Diagnosis not present

## 2022-07-30 DIAGNOSIS — H524 Presbyopia: Secondary | ICD-10-CM | POA: Diagnosis not present

## 2022-08-12 ENCOUNTER — Encounter: Payer: Self-pay | Admitting: Family

## 2022-08-14 ENCOUNTER — Encounter: Payer: Self-pay | Admitting: Family

## 2022-12-14 ENCOUNTER — Inpatient Hospital Stay (HOSPITAL_BASED_OUTPATIENT_CLINIC_OR_DEPARTMENT_OTHER): Payer: Commercial Managed Care - PPO | Admitting: Medical Oncology

## 2022-12-14 ENCOUNTER — Inpatient Hospital Stay: Payer: Commercial Managed Care - PPO | Attending: Hematology & Oncology

## 2022-12-14 ENCOUNTER — Other Ambulatory Visit: Payer: Self-pay

## 2022-12-14 VITALS — BP 138/92 | HR 62 | Temp 98.1°F | Resp 17 | Ht 70.0 in | Wt 179.1 lb

## 2022-12-14 DIAGNOSIS — Z79899 Other long term (current) drug therapy: Secondary | ICD-10-CM | POA: Diagnosis not present

## 2022-12-14 DIAGNOSIS — D693 Immune thrombocytopenic purpura: Secondary | ICD-10-CM | POA: Diagnosis not present

## 2022-12-14 DIAGNOSIS — R7989 Other specified abnormal findings of blood chemistry: Secondary | ICD-10-CM | POA: Insufficient documentation

## 2022-12-14 LAB — CMP (CANCER CENTER ONLY)
ALT: 17 U/L (ref 0–44)
AST: 21 U/L (ref 15–41)
Albumin: 4.8 g/dL (ref 3.5–5.0)
Alkaline Phosphatase: 44 U/L (ref 38–126)
Anion gap: 7 (ref 5–15)
BUN: 18 mg/dL (ref 6–20)
CO2: 32 mmol/L (ref 22–32)
Calcium: 9.4 mg/dL (ref 8.9–10.3)
Chloride: 103 mmol/L (ref 98–111)
Creatinine: 1.4 mg/dL — ABNORMAL HIGH (ref 0.61–1.24)
GFR, Estimated: >60 mL/min (ref >60)
Glucose, Bld: 100 mg/dL — ABNORMAL HIGH (ref 70–99)
Potassium: 4.3 mmol/L (ref 3.5–5.1)
Sodium: 142 mmol/L (ref 135–145)
Total Bilirubin: 0.9 mg/dL (ref 0.3–1.2)
Total Protein: 6.8 g/dL (ref 6.5–8.1)

## 2022-12-14 LAB — CBC WITH DIFFERENTIAL (CANCER CENTER ONLY)
Abs Immature Granulocytes: 0.01 10*3/uL (ref 0.00–0.07)
Basophils Absolute: 0 10*3/uL (ref 0.0–0.1)
Basophils Relative: 0 %
Eosinophils Absolute: 0.3 10*3/uL (ref 0.0–0.5)
Eosinophils Relative: 5 %
HCT: 42.6 % (ref 39.0–52.0)
Hemoglobin: 15.1 g/dL (ref 13.0–17.0)
Immature Granulocytes: 0 %
Lymphocytes Relative: 19 %
Lymphs Abs: 1.2 10*3/uL (ref 0.7–4.0)
MCH: 31.1 pg (ref 26.0–34.0)
MCHC: 35.4 g/dL (ref 30.0–36.0)
MCV: 87.7 fL (ref 80.0–100.0)
Monocytes Absolute: 0.6 10*3/uL (ref 0.1–1.0)
Monocytes Relative: 9 %
Neutro Abs: 4.1 10*3/uL (ref 1.7–7.7)
Neutrophils Relative %: 67 %
Platelet Count: 161 10*3/uL (ref 150–400)
RBC: 4.86 MIL/uL (ref 4.22–5.81)
RDW: 11.4 % — ABNORMAL LOW (ref 11.5–15.5)
WBC Count: 6.1 10*3/uL (ref 4.0–10.5)
nRBC: 0 % (ref 0.0–0.2)

## 2022-12-14 LAB — SAVE SMEAR(SSMR), FOR PROVIDER SLIDE REVIEW

## 2022-12-14 NOTE — Progress Notes (Signed)
Hematology and Oncology Follow Up Visit  Robert Young [id number] [birth year] 47 y.o. 12/14/2022   Principle Diagnosis:  Chronic immune thrombocytopenia  - Relapsed  Current Therapy:  Decadron 40 mg po q day x 4 days every 2 weeks - as needed Nplate - dose adjusted per pharmacy - as indicated     Interim History:  Robert Young is here today for follow-up.  We see him every 6 months.  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  He reports that he has been doing well. He worked out this morning and is a bit dehydrated which is why he thinks his creatinine is a bit elevated. He has no history of kidney disease.   He has had no problems with bleeding or bruising.  He has had no problems with cough or shortness of breath.  He has had no change in bowel or bladder habits.  There is been no swollen lymph nodes.  He has had no issues with rashes.  Currently, I would have to say that his performance status is probably ECOG 0.   Wt Readings from Last 3 Encounters:  12/14/22 179 lb 1.9 oz (81.2 kg)  06/14/22 182 lb (82.6 kg)  01/15/22 178 lb 4.8 oz (80.9 kg)     Medications:  Allergies as of 12/14/2022       Reactions   Zyrtec [cetirizine] Hypertension        Medication List    as of December 14, 2022  9:16 AM   You have not been prescribed any medications.     Allergies:  Allergies  Allergen Reactions   Zyrtec [Cetirizine] Hypertension    Past Medical History, Surgical history, Social history, and Family History were reviewed and updated.  Review of Systems: Review of Systems  Constitutional: Negative.   HENT: Negative.    Eyes: Negative.   Respiratory: Negative.    Cardiovascular: Negative.   Gastrointestinal: Negative.   Genitourinary: Negative.   Musculoskeletal: Negative.   Skin: Negative.   Neurological: Negative.   Endo/Heme/Allergies: Negative.   Psychiatric/Behavioral: Negative.       Physical  Exam:  height is  (1.778 m) and weight is 179 lb 1.9 oz (81.2 kg). His oral temperature is 98.1 F (36.7 C). His blood pressure is 138/92 (abnormal) and his pulse is 62. His respiration is 17 and oxygen saturation is 100%.   Wt Readings from Last 3 Encounters:  12/14/22 179 lb 1.9 oz (81.2 kg)  06/14/22 182 lb (82.6 kg)  01/15/22 178 lb 4.8 oz (80.9 kg)    Physical Exam Vitals reviewed.  HENT:     Head: Normocephalic and atraumatic.  Eyes:     Pupils: Pupils are equal, round, and reactive to light.  Cardiovascular:     Rate and Rhythm: Normal rate and regular rhythm.     Heart sounds: Normal heart sounds.  Pulmonary:     Effort: Pulmonary effort is normal.     Breath sounds: Normal breath sounds.  Musculoskeletal:        General: No tenderness or deformity. Normal range of motion.     Cervical back: Normal range of motion.  Lymphadenopathy:     Cervical: No cervical adenopathy.  Skin:    General: Skin is warm and dry.     Findings: No erythema or rash.  Neurological:     Mental Status: He is alert and oriented to person, place, and  time.  Psychiatric:        Behavior: Behavior normal.        Thought Content: Thought content normal.        Judgment: Judgment normal.     Lab Results  Component Value Date   WBC 6.1 12/14/2022   HGB 15.1 12/14/2022   HCT 42.6 12/14/2022   MCV 87.7 12/14/2022   PLT 161 12/14/2022   No results found for: "FERRITIN", "IRON", "TIBC", "UIBC", "IRONPCTSAT" Lab Results  Component Value Date   RBC 4.86 12/14/2022   No results found for: "KPAFRELGTCHN", "LAMBDASER", "KAPLAMBRATIO" No results found for: "IGGSERUM", "IGA", "IGMSERUM" No results found for: "TOTALPROTELP", "ALBUMINELP", "A1GS", "A2GS", "BETS", "BETA2SER", "GAMS", "MSPIKE", "SPEI"   Chemistry      Component Value Date/Time   NA 142 12/14/2022 0810   NA 142 07/01/2017 1246   K 4.3 12/14/2022 0810   K 4.1 07/01/2017 1246   CL 103 12/14/2022 0810   CL 94 (L) 09/06/2016  1502   CO2 32 12/14/2022 0810   CO2 30 (H) 07/01/2017 1246   BUN 18 12/14/2022 0810   BUN 12.4 07/01/2017 1246   CREATININE 1.40 (H) 12/14/2022 0810   CREATININE 1.18 01/15/2022 0000   CREATININE 1.1 07/01/2017 1246      Component Value Date/Time   CALCIUM 9.4 12/14/2022 0810   CALCIUM 9.1 07/01/2017 1246   ALKPHOS 44 12/14/2022 0810   ALKPHOS 61 07/01/2017 1246   AST 21 12/14/2022 0810   AST 19 07/01/2017 1246   ALT 17 12/14/2022 0810   ALT 13 07/01/2017 1246   BILITOT 0.9 12/14/2022 0810   BILITOT 0.66 07/01/2017 1246     Impression and Plan: Robert Young is a 47 year old white male. He has relapsed immune thrombocytopenia.  He is doing well. He will continue to work on hydration and I have encouraged him to keep follow up with his PCP regarding his creatinine values. In terms of her immune thrombocytopenia he does not require any intervention at this time as labs are normal.   We will continue every 31-month follow-up.  I think this is very reasonable for him.  He can was coming to see [place] before if he has a problem.   [name], PA-C 4/16/20249:16 AM

## 2023-01-18 ENCOUNTER — Encounter: Payer: 59 | Admitting: Family Medicine

## 2023-01-31 ENCOUNTER — Encounter: Payer: Self-pay | Admitting: Family Medicine

## 2023-01-31 ENCOUNTER — Ambulatory Visit (INDEPENDENT_AMBULATORY_CARE_PROVIDER_SITE_OTHER): Payer: Commercial Managed Care - PPO | Admitting: Family Medicine

## 2023-01-31 VITALS — BP 117/74 | HR 44 | Ht 70.0 in | Wt 183.0 lb

## 2023-01-31 DIAGNOSIS — Z0000 Encounter for general adult medical examination without abnormal findings: Secondary | ICD-10-CM

## 2023-01-31 DIAGNOSIS — Z13220 Encounter for screening for lipoid disorders: Secondary | ICD-10-CM

## 2023-01-31 NOTE — Progress Notes (Signed)
Robert Young - 47 y.o. male MRN [id number]  Date of birth: 11-08-1975  Subjective Chief Complaint  Patient presents with   Annual Exam    HPI Robert Young is a 47 y.o. male here today for annual exam.   He reports that he is doing well.   He sees hematology for history of chronic ITP.  Stable at this time.   He is moderately active.  He feels that diet is pretty good.   He did use oral tobacco products in the past.  No smoking history.  Denies EtOH use at this time.   Review of Systems  Constitutional:  Negative for chills, fever, malaise/fatigue and weight loss.  HENT:  Negative for congestion, ear pain and sore throat.   Eyes:  Negative for blurred vision, double vision and pain.  Respiratory:  Negative for cough and shortness of breath.   Cardiovascular:  Negative for chest pain and palpitations.  Gastrointestinal:  Negative for abdominal pain, blood in stool, constipation, heartburn and nausea.  Genitourinary:  Negative for dysuria and urgency.  Musculoskeletal:  Negative for joint pain and myalgias.  Neurological:  Negative for dizziness and headaches.  Endo/Heme/Allergies:  Does not bruise/bleed easily.  Psychiatric/Behavioral:  Negative for depression. The patient is not nervous/anxious and does not have insomnia.     Allergies  Allergen Reactions   Zyrtec [Cetirizine] Hypertension    Past Medical History:  Diagnosis Date   Allergy    Idiopathic thrombocythemia (HCC)    Left lower lobe pneumonia 08/24/2017    Past Surgical History:  Procedure Laterality Date   TONSILLECTOMY     TONSILLECTOMY AND ADENOIDECTOMY      Social History   Socioeconomic History   Marital status: Married    Spouse name: Not on file   Number of children: Not on file   Years of education: Not on file   Highest education level: Not on file  Occupational History   Occupation: physicist   Tobacco Use   Smoking status: Never   Smokeless tobacco: Former    Types: Chew    Quit  date: 07/27/2008   Tobacco comments:    NEVER USED TOBACCO  Vaping Use   Vaping Use: Never used  Substance and Sexual Activity   Alcohol use: Yes    Alcohol/week: 0.0 standard drinks of alcohol    Comment: few drinks per week    Drug use: No   Sexual activity: Yes    Birth control/protection: None  Other Topics Concern   Not on file  Social History Narrative   Not on file   Social Determinants of Health   Financial Resource Strain: Not on file  Food Insecurity: Not on file  Transportation Needs: Not on file  Physical Activity: Not on file  Stress: Not on file  Social Connections: Not on file    Family History  Problem Relation Age of Onset   Heart disease Father    Hyperlipidemia Father    Hypertension Father    Stroke Father        at age 28's    Heart disease Paternal Uncle    Cancer Paternal Grandmother        breast    Health Maintenance  Topic Date Due   COVID-19 Vaccine (3 - 2023-24 season) 12/31/2023 (Originally 04/30/2022)   INFLUENZA VACCINE  03/31/2023   DTaP/Tdap/Td (3 - Td or Tdap) 09/21/2028   Hepatitis C Screening  Completed   HIV Screening  Completed  HPV VACCINES  Aged Out     ----------------------------------------------------------------------------------------------------------------------------------------------------------------------------------------------------------------- Physical Exam BP 117/74 (BP Location: Left Arm, Patient Position: Sitting, Cuff Size: Normal)   Pulse (!) 44   Ht 5\' 10"  (1.778 m)   Wt 183 lb (83 kg)   SpO2 100%   BMI 26.26 kg/m   Physical Exam Constitutional:      General: He is not in acute distress. HENT:     Head: Normocephalic and atraumatic.     Right Ear: Tympanic membrane and external ear normal.     Left Ear: Tympanic membrane and external ear normal.  Eyes:     General: No scleral icterus. Neck:     Thyroid: No thyromegaly.  Cardiovascular:     Rate and Rhythm: Normal rate and regular  rhythm.     Heart sounds: Normal heart sounds.  Pulmonary:     Effort: Pulmonary effort is normal.     Breath sounds: Normal breath sounds.  Abdominal:     General: Bowel sounds are normal. There is no distension.     Palpations: Abdomen is soft.     Tenderness: There is no abdominal tenderness. There is no guarding.  Musculoskeletal:     Cervical back: Normal range of motion.  Lymphadenopathy:     Cervical: No cervical adenopathy.  Skin:    General: Skin is warm and dry.     Findings: No rash.  Neurological:     Mental Status: He is alert and oriented to person, place, and time.     Cranial Nerves: No cranial nerve deficit.     Motor: No abnormal muscle tone.  Psychiatric:        Mood and Affect: Mood normal.        Behavior: Behavior normal.     ------------------------------------------------------------------------------------------------------------------------------------------------------------------------------------------------------------------- Assessment and Plan  Well adult exam Well adult Orders Placed This Encounter  Procedures   COMPLETE METABOLIC PANEL WITH GFR   CBC with Differential   Lipid Panel w/reflex Direct LDL  Screenings: per lab orders Immunizations: UTD Anticipatory guidance/Risk factor reduction:  Recommendations per AVS.    No orders of the defined types were placed in this encounter.   No follow-ups on file.    This visit occurred during the SARS-CoV-2 public health emergency.  Safety protocols were in place, including screening questions prior to the visit, additional usage of staff PPE, and extensive cleaning of exam room while observing appropriate contact time as indicated for disinfecting solutions.

## 2023-01-31 NOTE — Assessment & Plan Note (Signed)
Well adult Orders Placed This Encounter  Procedures  . COMPLETE METABOLIC PANEL WITH GFR  . CBC with Differential  . Lipid Panel w/reflex Direct LDL  Screenings: per lab orders Immunizations:  UTD Anticipatory guidance/Risk factor reduction:  Recommendations per AVS.  

## 2023-02-01 LAB — LIPID PANEL W/REFLEX DIRECT LDL
Cholesterol: 179 mg/dL (ref <200)
HDL: 51 mg/dL (ref >=40)
LDL Cholesterol (Calc): 107 mg/dL (calc) — ABNORMAL HIGH
Non-HDL Cholesterol (Calc): 128 mg/dL (calc) (ref <130)
Total CHOL/HDL Ratio: 3.5 (calc) (ref <5.0)
Triglycerides: 111 mg/dL (ref <150)

## 2023-02-01 LAB — COMPLETE METABOLIC PANEL WITH GFR
AG Ratio: 3.3 (calc) — ABNORMAL HIGH (ref 1.0–2.5)
ALT: 13 U/L (ref 9–46)
AST: 17 U/L (ref 10–40)
Albumin: 4.9 g/dL (ref 3.6–5.1)
Alkaline phosphatase (APISO): 45 U/L (ref 36–130)
BUN: 15 mg/dL (ref 7–25)
CO2: 28 mmol/L (ref 20–32)
Calcium: 9.2 mg/dL (ref 8.6–10.3)
Chloride: 105 mmol/L (ref 98–110)
Creat: 1.12 mg/dL (ref 0.60–1.29)
Globulin: 1.5 g/dL (calc) — ABNORMAL LOW (ref 1.9–3.7)
Glucose, Bld: 95 mg/dL (ref 65–99)
Potassium: 4.4 mmol/L (ref 3.5–5.3)
Sodium: 142 mmol/L (ref 135–146)
Total Bilirubin: 1.2 mg/dL (ref 0.2–1.2)
Total Protein: 6.4 g/dL (ref 6.1–8.1)
eGFR: 83 mL/min/{1.73_m2} (ref >=60)

## 2023-02-01 LAB — CBC WITH DIFFERENTIAL/PLATELET
Absolute Monocytes: 454 cells/uL (ref 200–950)
Basophils Absolute: 8 cells/uL (ref 0–200)
Basophils Relative: 0.2 %
Eosinophils Absolute: 311 cells/uL (ref 15–500)
Eosinophils Relative: 7.4 %
HCT: 42.7 % (ref 38.5–50.0)
Hemoglobin: 14.4 g/dL (ref 13.2–17.1)
Lymphs Abs: 1168 cells/uL (ref 850–3900)
MCH: 30.4 pg (ref 27.0–33.0)
MCHC: 33.7 g/dL (ref 32.0–36.0)
MCV: 90.3 fL (ref 80.0–100.0)
MPV: 10.7 fL (ref 7.5–12.5)
Monocytes Relative: 10.8 %
Neutro Abs: 2260 cells/uL (ref 1500–7800)
Neutrophils Relative %: 53.8 %
Platelets: 154 10*3/uL (ref 140–400)
RBC: 4.73 10*6/uL (ref 4.20–5.80)
RDW: 12 % (ref 11.0–15.0)
Total Lymphocyte: 27.8 %
WBC: 4.2 10*3/uL (ref 3.8–10.8)

## 2023-03-04 ENCOUNTER — Ambulatory Visit: Payer: Commercial Managed Care - PPO | Admitting: Family Medicine

## 2023-03-04 ENCOUNTER — Encounter: Payer: Self-pay | Admitting: Family Medicine

## 2023-03-04 VITALS — BP 124/80 | HR 53 | Ht 70.0 in | Wt 180.0 lb

## 2023-03-04 DIAGNOSIS — J029 Acute pharyngitis, unspecified: Secondary | ICD-10-CM | POA: Diagnosis not present

## 2023-03-04 NOTE — Progress Notes (Signed)
   Acute Office Visit  Subjective:     Patient ID: Robert Young, male    DOB: 1975-09-08, 47 y.o.   MRN: [id number]  Chief Complaint  Patient presents with   Allergic Rhinitis    Sore Throat    HPI Patient is in today for ST. Started 2 weeks ago.  He has had some allergy symptoms and has been using his Claritin and Nasacort for about 8 weeks pretty consistently he did have a short period where he came off.  But for the last 2 weeks he has had a sore throat he is also had a lot of postnasal drip and drainage.  No recent GERD symptoms.  No fevers, chills, sweats.  No facial pain.  No difficulty or painful swallowing.  He has had a sore throat in the past with his drainage but he said it is never lasted this long.  Just had a normal CBC June 3.  No known exposure to strep throat.  ROS      Objective:    BP 124/80   Pulse (!) 53   Ht 5\' 10"  (1.778 m)   Wt 180 lb (81.6 kg)   SpO2 100%   BMI 25.83 kg/m    Physical Exam Constitutional:      Appearance: He is well-developed.  HENT:     Head: Normocephalic and atraumatic.     Right Ear: Tympanic membrane, ear canal and external ear normal.     Left Ear: Tympanic membrane, ear canal and external ear normal.     Nose: Nose normal.     Mouth/Throat:     Pharynx: Oropharynx is clear. No oropharyngeal exudate or posterior oropharyngeal erythema.     Comments: Cobblestoning on exam. Eyes:     Conjunctiva/sclera: Conjunctivae normal.     Pupils: Pupils are equal, round, and reactive to light.  Neck:     Thyroid: No thyromegaly.  Cardiovascular:     Rate and Rhythm: Normal rate.     Heart sounds: Normal heart sounds.  Pulmonary:     Effort: Pulmonary effort is normal.     Breath sounds: Normal breath sounds.  Musculoskeletal:     Cervical back: Neck supple.  Lymphadenopathy:     Cervical: No cervical adenopathy.  Skin:    General: Skin is warm and dry.  Neurological:     Mental Status: He is alert and oriented to person,  place, and time.     No results found for any visits on 03/04/23.      Assessment & Plan:   Problem List Items Addressed This Visit   None Visit Diagnoses     Sore throat    -  Primary      Sore throat x 2 weeks with cobblestoning on exam.  Most consistent with drainage and or viral illness.  We discussed treatment with nasal saline irrigation as well as adding a nasal antihistamine to help dry up secretions.  Okay to continue with the Nasonex as well.  If not improving over the next week then please let us know.  Could also consider Atrovent nasal spray as well.  No orders of the defined types were placed in this encounter.   No follow-ups on file.  Nani Gasser, MD

## 2023-03-04 NOTE — Patient Instructions (Addendum)
Recommend a trial of Astelin and Astepro to help dry the secretions. Also recommend nasal saline irrigation once or twice daily. If not better in one week please let us know.

## 2023-06-16 ENCOUNTER — Inpatient Hospital Stay: Payer: Commercial Managed Care - PPO | Attending: Hematology & Oncology

## 2023-06-16 ENCOUNTER — Inpatient Hospital Stay: Payer: Commercial Managed Care - PPO | Admitting: Medical Oncology

## 2023-06-16 ENCOUNTER — Encounter: Payer: Self-pay | Admitting: Medical Oncology

## 2023-06-16 ENCOUNTER — Inpatient Hospital Stay: Payer: Commercial Managed Care - PPO

## 2023-06-16 VITALS — BP 122/82 | HR 54 | Temp 97.9°F | Resp 17 | Wt 179.0 lb

## 2023-06-16 DIAGNOSIS — Z79899 Other long term (current) drug therapy: Secondary | ICD-10-CM | POA: Diagnosis not present

## 2023-06-16 DIAGNOSIS — D693 Immune thrombocytopenic purpura: Secondary | ICD-10-CM

## 2023-06-16 LAB — CBC WITH DIFFERENTIAL (CANCER CENTER ONLY)
Abs Immature Granulocytes: 0.03 10*3/uL (ref 0.00–0.07)
Basophils Absolute: 0 10*3/uL (ref 0.0–0.1)
Basophils Relative: 0 %
Eosinophils Absolute: 0.2 10*3/uL (ref 0.0–0.5)
Eosinophils Relative: 3 %
HCT: 42.6 % (ref 39.0–52.0)
Hemoglobin: 14.9 g/dL (ref 13.0–17.0)
Immature Granulocytes: 1 %
Lymphocytes Relative: 16 %
Lymphs Abs: 0.9 10*3/uL (ref 0.7–4.0)
MCH: 30.7 pg (ref 26.0–34.0)
MCHC: 35 g/dL (ref 30.0–36.0)
MCV: 87.8 fL (ref 80.0–100.0)
Monocytes Absolute: 0.5 10*3/uL (ref 0.1–1.0)
Monocytes Relative: 8 %
Neutro Abs: 4.2 10*3/uL (ref 1.7–7.7)
Neutrophils Relative %: 72 %
Platelet Count: 152 10*3/uL (ref 150–400)
RBC: 4.85 MIL/uL (ref 4.22–5.81)
RDW: 11.6 % (ref 11.5–15.5)
WBC Count: 5.8 10*3/uL (ref 4.0–10.5)
nRBC: 0 % (ref 0.0–0.2)

## 2023-06-16 LAB — CMP (CANCER CENTER ONLY)
ALT: 16 U/L (ref 0–44)
AST: 21 U/L (ref 15–41)
Albumin: 4.9 g/dL (ref 3.5–5.0)
Alkaline Phosphatase: 43 U/L (ref 38–126)
Anion gap: 8 (ref 5–15)
BUN: 19 mg/dL (ref 6–20)
CO2: 31 mmol/L (ref 22–32)
Calcium: 9.7 mg/dL (ref 8.9–10.3)
Chloride: 103 mmol/L (ref 98–111)
Creatinine: 1.44 mg/dL — ABNORMAL HIGH (ref 0.61–1.24)
GFR, Estimated: >60 mL/min (ref >60)
Glucose, Bld: 92 mg/dL (ref 70–99)
Potassium: 4.7 mmol/L (ref 3.5–5.1)
Sodium: 142 mmol/L (ref 135–145)
Total Bilirubin: 1 mg/dL (ref 0.3–1.2)
Total Protein: 6.7 g/dL (ref 6.5–8.1)

## 2023-06-16 NOTE — Progress Notes (Signed)
Hematology and Oncology Follow Up Visit  Robert Young [id number] February 27, 1976 47 y.o. 06/16/2023   Principle Diagnosis:  Chronic immune thrombocytopenia  - Relapsed  Current Therapy:  Decadron 40 mg po q day x 4 days every 2 weeks - as needed Nplate - dose adjusted per pharmacy - as indicated     Interim History:  Robert Young is here today for follow-up.  We see him every 6 months.  He is still quite busy and Radiation Oncology.    He states that he is doing well today. He has no concerns or complaints. He worked out this morning and feels dehydrated. He has plans to remedy this when he leaves. He denies any excessive NSAID use, vitamin C supplementation, reduced urinary flow.   There has been no bleeding to his knowledge: denies epistaxis, gingivitis, hemoptysis, hematemesis, hematuria, melena, excessive bruising, blood donation.    He has had no problems with bleeding or bruising.  He has had no problems with cough or shortness of breath.  He has had no change in bowel or bladder habits.  There is been no swollen lymph nodes.  He has had no issues with rashes.  Currently, I would have to say that his performance status is probably ECOG 0.   Wt Readings from Last 3 Encounters:  06/16/23 179 lb 0.6 oz (81.2 kg)  03/04/23 180 lb (81.6 kg)  01/31/23 183 lb (83 kg)     Medications:  Allergies as of 06/16/2023       Reactions   Zyrtec [cetirizine] Hypertension        Medication List        Accurate as of June 16, 2023  9:09 AM. If you have any questions, ask your nurse or doctor.          loratadine 5 MG chewable tablet Commonly known as: CLARITIN Chew 5 mg by mouth daily.        Allergies:  Allergies  Allergen Reactions   Zyrtec [Cetirizine] Hypertension    Past Medical History, Surgical history, Social history, and Family History were reviewed and updated.  Review of Systems: Review of Systems  Constitutional: Negative.   HENT: Negative.    Eyes:  Negative.   Respiratory: Negative.    Cardiovascular: Negative.   Gastrointestinal: Negative.   Genitourinary: Negative.   Musculoskeletal: Negative.   Skin: Negative.   Neurological: Negative.   Endo/Heme/Allergies: Negative.   Psychiatric/Behavioral: Negative.       Physical Exam:  weight is 179 lb 0.6 oz (81.2 kg). His oral temperature is 97.9 F (36.6 C). His blood pressure is 122/82 and his pulse is 54 (abnormal). His respiration is 17 and oxygen saturation is 100%.   Wt Readings from Last 3 Encounters:  06/16/23 179 lb 0.6 oz (81.2 kg)  03/04/23 180 lb (81.6 kg)  01/31/23 183 lb (83 kg)    Physical Exam Vitals reviewed.  HENT:     Head: Normocephalic and atraumatic.  Eyes:     Pupils: Pupils are equal, round, and reactive to light.  Cardiovascular:     Rate and Rhythm: Normal rate and regular rhythm.     Heart sounds: Normal heart sounds.  Pulmonary:     Effort: Pulmonary effort is normal.     Breath sounds: Normal breath sounds.  Musculoskeletal:        General: No tenderness or deformity. Normal range of motion.     Cervical back: Normal range of motion.  Lymphadenopathy:  Cervical: No cervical adenopathy.  Skin:    General: Skin is warm and dry.     Findings: No erythema or rash.  Neurological:     Mental Status: He is alert and oriented to person, place, and time.  Psychiatric:        Behavior: Behavior normal.        Thought Content: Thought content normal.        Judgment: Judgment normal.     Lab Results  Component Value Date   WBC 5.8 06/16/2023   HGB 14.9 06/16/2023   HCT 42.6 06/16/2023   MCV 87.8 06/16/2023   PLT 152 06/16/2023   No results found for: "FERRITIN", "IRON", "TIBC", "UIBC", "IRONPCTSAT" Lab Results  Component Value Date   RBC 4.85 06/16/2023   No results found for: "KPAFRELGTCHN", "LAMBDASER", "KAPLAMBRATIO" No results found for: "IGGSERUM", "IGA", "IGMSERUM" No results found for: "TOTALPROTELP", "ALBUMINELP", "A1GS",  "A2GS", "BETS", "BETA2SER", "GAMS", "MSPIKE", "SPEI"   Chemistry      Component Value Date/Time   NA 142 06/16/2023 0816   NA 142 07/01/2017 1246   K 4.7 06/16/2023 0816   K 4.1 07/01/2017 1246   CL 103 06/16/2023 0816   CL 94 (L) 09/06/2016 1502   CO2 31 06/16/2023 0816   CO2 30 (H) 07/01/2017 1246   BUN 19 06/16/2023 0816   BUN 12.4 07/01/2017 1246   CREATININE 1.44 (H) 06/16/2023 0816   CREATININE 1.12 01/31/2023 1032   CREATININE 1.1 07/01/2017 1246      Component Value Date/Time   CALCIUM 9.7 06/16/2023 0816   CALCIUM 9.1 07/01/2017 1246   ALKPHOS 43 06/16/2023 0816   ALKPHOS 61 07/01/2017 1246   AST 21 06/16/2023 0816   AST 19 07/01/2017 1246   ALT 16 06/16/2023 0816   ALT 13 07/01/2017 1246   BILITOT 1.0 06/16/2023 0816   BILITOT 0.66 07/01/2017 1246     Impression and Plan: Robert Young is a 47 year old white male. He has relapsed immune thrombocytopenia.  He is doing well. CBC reviewed and is normal. Again we discussed his elevated creatinine value. He will hydrate and follow up with PCP.   RTC 6 months APP, labs (CBC, CMP)-[place]    [name], [place] 10/17/20249:09 AM

## 2023-06-20 DIAGNOSIS — H524 Presbyopia: Secondary | ICD-10-CM | POA: Diagnosis not present

## 2023-10-19 ENCOUNTER — Ambulatory Visit: Payer: Commercial Managed Care - PPO | Admitting: Physician Assistant

## 2023-10-19 ENCOUNTER — Encounter: Payer: Self-pay | Admitting: Physician Assistant

## 2023-10-19 VITALS — BP 128/80 | HR 74 | Ht 66.0 in | Wt 168.2 lb

## 2023-10-19 DIAGNOSIS — J329 Chronic sinusitis, unspecified: Secondary | ICD-10-CM

## 2023-10-19 DIAGNOSIS — J209 Acute bronchitis, unspecified: Secondary | ICD-10-CM | POA: Insufficient documentation

## 2023-10-19 DIAGNOSIS — J40 Bronchitis, not specified as acute or chronic: Secondary | ICD-10-CM | POA: Diagnosis not present

## 2023-10-19 MED ORDER — BENZONATATE 200 MG PO CAPS
200.0000 mg | ORAL_CAPSULE | Freq: Three times a day (TID) | ORAL | 0 refills | Status: DC | PRN
Start: 2023-10-19 — End: 2024-03-20

## 2023-10-19 MED ORDER — AZITHROMYCIN 250 MG PO TABS: ORAL_TABLET | ORAL | 0 refills | Status: DC

## 2023-10-19 MED ORDER — PROMETHAZINE-DM 6.25-15 MG/5ML PO SYRP
5.0000 mL | ORAL_SOLUTION | Freq: Four times a day (QID) | ORAL | 0 refills | Status: DC | PRN
Start: 2023-10-19 — End: 2024-03-20

## 2023-10-19 NOTE — Progress Notes (Signed)
 Acute Office Visit  Subjective:     Patient ID: Robert Young, male    DOB: 02-May-1976, 48 y.o.   MRN: [id number]  CC: cough   HPI Patient is a 48 yo male who presents with a chief complaint of  a lingering cough and sinus pressure with congestion. He endorses flu-like symptoms that started on February 10 but have since subsided. He states he did not get treatment or flu testing. Endorses sick contacts. He still continues to have a cough, congestion, and a headache. The cough is keeping him up at night. Denies fever, chills, chest pain. He took Mucinex flu last week which did not help his symptoms. He is using Nasacort daily.   .. Active Ambulatory Problems    Diagnosis Date Noted   Allergic rhinitis 03/30/2010   Chronic ITP (idiopathic thrombocytopenia) (HCC) 07/28/2015   Persistent cough for 3 weeks or longer 08/19/2017   Left lower lobe pneumonia 08/24/2017   Warts of foot 12/03/2021   Well adult exam 01/15/2022   Acute bronchitis 10/19/2023   Resolved Ambulatory Problems    Diagnosis Date Noted   Cellulitis 04/01/2020   Past Medical History:  Diagnosis Date   Allergy    Idiopathic thrombocythemia (HCC)     Review of Systems  Constitutional:  Negative for chills and fever.  HENT:  Positive for congestion and sinus pain. Negative for sore throat.   Respiratory:  Positive for cough. Negative for shortness of breath.   Cardiovascular:  Positive for palpitations. Negative for chest pain.  Gastrointestinal:  Positive for diarrhea and nausea. Negative for abdominal pain and constipation.  Genitourinary: Negative.   Musculoskeletal:  Positive for myalgias.  Neurological:  Positive for headaches.      Objective:    BP 128/80 (BP Location: Left Arm, Patient Position: Sitting, Cuff Size: Normal)   Pulse 74   Ht 5\' 6"  (1.676 m)   Wt 168 lb 4 oz (76.3 kg)   SpO2 98%   BMI 27.16 kg/m  BP Readings from Last 3 Encounters:  10/19/23 128/80  06/16/23 122/82  03/04/23 124/80    Wt Readings from Last 3 Encounters:  10/19/23 168 lb 4 oz (76.3 kg)  06/16/23 179 lb 0.6 oz (81.2 kg)  03/04/23 180 lb (81.6 kg)   Physical Exam Constitutional:      Appearance: Normal appearance.  HENT:     Head: Normocephalic and atraumatic.     Comments: Nasal turbinates red and swollen.  Tenderness over maxillary and frontal sinuses to palpation.     Right Ear: Tympanic membrane, ear canal and external ear normal. There is no impacted cerumen.     Left Ear: Tympanic membrane, ear canal and external ear normal. There is no impacted cerumen.     Nose: Congestion present.     Mouth/Throat:     Mouth: Mucous membranes are moist.     Pharynx: Oropharynx is clear.  Eyes:     Extraocular Movements: Extraocular movements intact.  Cardiovascular:     Rate and Rhythm: Normal rate and regular rhythm.     Pulses: Normal pulses.     Heart sounds: Normal heart sounds.  Pulmonary:     Effort: Pulmonary effort is normal.     Breath sounds: Normal breath sounds.  Musculoskeletal:        General: Normal range of motion.     Cervical back: Normal range of motion.  Skin:    General: Skin is warm.  Neurological:  General: No focal deficit present.     Mental Status: He is alert and oriented to person, place, and time.  Psychiatric:        Mood and Affect: Mood normal.         Assessment & Plan:  [name] KitchenMarland KitchenJerick was seen today for cough.  Diagnoses and all orders for this visit:  Sinobronchitis -     promethazine-dextromethorphan (PROMETHAZINE-DM) 6.25-15 MG/5ML syrup; Take 5 mLs by mouth 4 (four) times daily as needed for cough. -     azithromycin (ZITHROMAX Z-PAK) 250 MG tablet; Take 2 tablets (500 mg) on  Day 1,  followed by 1 tablet (250 mg) once daily on Days 2 through 5. -     benzonatate (TESSALON) 200 MG capsule; Take 1 capsule (200 mg total) by mouth 3 (three) times daily as needed.   Likely post viral secondary infection treated with zpak, tessalon pearls and  promethazine/dextrometorphan cough syrup Continue nasocort Follow up as needed or if symptoms persist.    Tandy Gaw, PA-C

## 2023-10-19 NOTE — Patient Instructions (Signed)

## 2023-12-15 ENCOUNTER — Ambulatory Visit: Payer: Commercial Managed Care - PPO | Admitting: Medical Oncology

## 2023-12-15 ENCOUNTER — Other Ambulatory Visit: Payer: Commercial Managed Care - PPO

## 2023-12-21 ENCOUNTER — Inpatient Hospital Stay: Payer: Commercial Managed Care - PPO | Attending: Hematology & Oncology

## 2023-12-21 ENCOUNTER — Encounter: Payer: Self-pay | Admitting: Medical Oncology

## 2023-12-21 ENCOUNTER — Inpatient Hospital Stay (HOSPITAL_BASED_OUTPATIENT_CLINIC_OR_DEPARTMENT_OTHER): Payer: Commercial Managed Care - PPO | Admitting: Medical Oncology

## 2023-12-21 VITALS — BP 136/80 | HR 54 | Temp 98.2°F | Resp 17 | Wt 179.0 lb

## 2023-12-21 DIAGNOSIS — D693 Immune thrombocytopenic purpura: Secondary | ICD-10-CM

## 2023-12-21 DIAGNOSIS — Z79899 Other long term (current) drug therapy: Secondary | ICD-10-CM | POA: Diagnosis not present

## 2023-12-21 LAB — CMP (CANCER CENTER ONLY)
ALT: 21 U/L (ref 0–44)
AST: 24 U/L (ref 15–41)
Albumin: 5 g/dL (ref 3.5–5.0)
Alkaline Phosphatase: 42 U/L (ref 38–126)
Anion gap: 8 (ref 5–15)
BUN: 18 mg/dL (ref 6–20)
CO2: 31 mmol/L (ref 22–32)
Calcium: 9.5 mg/dL (ref 8.9–10.3)
Chloride: 105 mmol/L (ref 98–111)
Creatinine: 1.3 mg/dL — ABNORMAL HIGH (ref 0.61–1.24)
GFR, Estimated: >60 mL/min (ref >60)
Glucose, Bld: 107 mg/dL — ABNORMAL HIGH (ref 70–99)
Potassium: 5 mmol/L (ref 3.5–5.1)
Sodium: 144 mmol/L (ref 135–145)
Total Bilirubin: 0.8 mg/dL (ref 0.0–1.2)
Total Protein: 6.6 g/dL (ref 6.5–8.1)

## 2023-12-21 LAB — CBC
HCT: 42.9 % (ref 39.0–52.0)
Hemoglobin: 15 g/dL (ref 13.0–17.0)
MCH: 31.2 pg (ref 26.0–34.0)
MCHC: 35 g/dL (ref 30.0–36.0)
MCV: 89.2 fL (ref 80.0–100.0)
Platelets: 153 10*3/uL (ref 150–400)
RBC: 4.81 MIL/uL (ref 4.22–5.81)
RDW: 12 % (ref 11.5–15.5)
WBC: 4.8 10*3/uL (ref 4.0–10.5)
nRBC: 0 % (ref 0.0–0.2)

## 2023-12-21 NOTE — Progress Notes (Signed)
 Hematology and Oncology Follow Up Visit  Robert Young [id number] [birth year] 48 y.o. 12/21/2023   Principle Diagnosis:  Chronic immune thrombocytopenia  - Relapsed  Current Therapy:  Decadron  40 mg po q day x 4 days every 2 weeks - as needed Nplate  - dose adjusted per pharmacy - as indicated     Interim History:  Robert Young is here today for follow-up.  We see him every 6 months. He works in Marketing executive.    Today he reports that he has been doing well. He has no concerns.   There has been no bleeding to his knowledge: denies epistaxis, gingivitis, hemoptysis, hematemesis, hematuria, melena, excessive bruising, blood donation.    He has had no problems with bleeding or bruising.  He has had no problems with cough or shortness of breath.  He has had no change in bowel or bladder habits.  There is been no swollen lymph nodes.  He has had no issues with rashes.  Currently, I would have to say that his performance status is probably ECOG 0.   Wt Readings from Last 3 Encounters:  12/21/23 179 lb (81.2 kg)  10/19/23 168 lb 4 oz (76.3 kg)  06/16/23 179 lb 0.6 oz (81.2 kg)     Medications:  Allergies as of 12/21/2023       Reactions   Zyrtec [cetirizine] Hypertension        Medication List        Accurate as of December 21, 2023  9:20 AM. If you have any questions, ask your nurse or doctor.          azithromycin  250 MG tablet Commonly known as: Zithromax  Z-Pak Take 2 tablets (500 mg) on  Day 1,  followed by 1 tablet (250 mg) once daily on Days 2 through 5.   benzonatate  200 MG capsule Commonly known as: TESSALON  Take 1 capsule (200 mg total) by mouth 3 (three) times daily as needed.   loratadine  5 MG chewable tablet Commonly known as: CLARITIN  Chew 5 mg by mouth daily.   promethazine -dextromethorphan 6.25-15 MG/5ML syrup Commonly known as: PROMETHAZINE -DM Take 5 mLs by mouth 4 (four) times daily as needed for cough.        Allergies:  Allergies   Allergen Reactions   Zyrtec [Cetirizine] Hypertension    Past Medical History, Surgical history, Social history, and Family History were reviewed and updated.  Review of Systems: Review of Systems  Constitutional: Negative.   HENT: Negative.    Eyes: Negative.   Respiratory: Negative.    Cardiovascular: Negative.   Gastrointestinal: Negative.   Genitourinary: Negative.   Musculoskeletal: Negative.   Skin: Negative.   Neurological: Negative.   Endo/Heme/Allergies: Negative.   Psychiatric/Behavioral: Negative.       Physical Exam:  weight is 179 lb (81.2 kg). His oral temperature is 98.2 F (36.8 C). His blood pressure is 136/80 and his pulse is 54 (abnormal). His respiration is 17 and oxygen saturation is 100%.   Wt Readings from Last 3 Encounters:  12/21/23 179 lb (81.2 kg)  10/19/23 168 lb 4 oz (76.3 kg)  06/16/23 179 lb 0.6 oz (81.2 kg)    Physical Exam Vitals reviewed.  HENT:     Head: Normocephalic and atraumatic.  Eyes:     Pupils: Pupils are equal, round, and reactive to light.  Cardiovascular:     Rate and Rhythm: Normal rate and regular rhythm.     Heart sounds: Normal heart sounds.  Pulmonary:  Effort: Pulmonary effort is normal.     Breath sounds: Normal breath sounds.  Musculoskeletal:        General: No tenderness or deformity. Normal range of motion.     Cervical back: Normal range of motion.  Lymphadenopathy:     Cervical: No cervical adenopathy.  Skin:    General: Skin is warm and dry.     Findings: No erythema or rash.  Neurological:     Mental Status: He is alert and oriented to person, place, and time.  Psychiatric:        Behavior: Behavior normal.        Thought Content: Thought content normal.        Judgment: Judgment normal.     Lab Results  Component Value Date   WBC 4.8 12/21/2023   HGB 15.0 12/21/2023   HCT 42.9 12/21/2023   MCV 89.2 12/21/2023   PLT 153 12/21/2023   No results found for: "FERRITIN", "IRON", "TIBC",  "UIBC", "IRONPCTSAT" Lab Results  Component Value Date   RBC 4.81 12/21/2023   No results found for: "KPAFRELGTCHN", "LAMBDASER", "KAPLAMBRATIO" No results found for: "IGGSERUM", "IGA", "IGMSERUM" No results found for: "TOTALPROTELP", "ALBUMINELP", "A1GS", "A2GS", "BETS", "BETA2SER", "GAMS", "MSPIKE", "SPEI"   Chemistry      Component Value Date/Time   NA 144 12/21/2023 0811   NA 142 07/01/2017 1246   K 5.0 12/21/2023 0811   K 4.1 07/01/2017 1246   CL 105 12/21/2023 0811   CL 94 (L) 09/06/2016 1502   CO2 31 12/21/2023 0811   CO2 30 (H) 07/01/2017 1246   BUN 18 12/21/2023 0811   BUN 12.4 07/01/2017 1246   CREATININE 1.30 (H) 12/21/2023 0811   CREATININE 1.12 01/31/2023 1032   CREATININE 1.1 07/01/2017 1246      Component Value Date/Time   CALCIUM 9.5 12/21/2023 0811   CALCIUM 9.1 07/01/2017 1246   ALKPHOS 42 12/21/2023 0811   ALKPHOS 61 07/01/2017 1246   AST 24 12/21/2023 0811   AST 19 07/01/2017 1246   ALT 21 12/21/2023 0811   ALT 13 07/01/2017 1246   BILITOT 0.8 12/21/2023 0811   BILITOT 0.66 07/01/2017 1246     Impression and Plan: Robert Young is a 48 year old white male. He has relapsed immune thrombocytopenia.  He continues to do well. CBC reviewed and is normal.  CMP shows improved creatinine. He will hydrate and follow up with PCP.  Next year we may extend his follow up to 1 year  RTC 6 months APP, labs (CBC, CMP)-    Sharla Davis, [place] 4/23/20259:20 AM

## 2024-02-01 ENCOUNTER — Encounter: Payer: Commercial Managed Care - PPO | Admitting: Family Medicine

## 2024-02-09 ENCOUNTER — Encounter: Payer: Commercial Managed Care - PPO | Admitting: Family Medicine

## 2024-02-16 ENCOUNTER — Encounter: Payer: Self-pay | Admitting: Family Medicine

## 2024-02-16 ENCOUNTER — Ambulatory Visit (INDEPENDENT_AMBULATORY_CARE_PROVIDER_SITE_OTHER): Admitting: Family Medicine

## 2024-02-16 VITALS — BP 122/75 | HR 52 | Ht 66.0 in | Wt 179.0 lb

## 2024-02-16 DIAGNOSIS — Z13220 Encounter for screening for lipoid disorders: Secondary | ICD-10-CM | POA: Diagnosis not present

## 2024-02-16 DIAGNOSIS — Z0000 Encounter for general adult medical examination without abnormal findings: Secondary | ICD-10-CM | POA: Diagnosis not present

## 2024-02-16 DIAGNOSIS — Z1211 Encounter for screening for malignant neoplasm of colon: Secondary | ICD-10-CM | POA: Diagnosis not present

## 2024-02-16 NOTE — Patient Instructions (Signed)

## 2024-02-16 NOTE — Progress Notes (Signed)
 [name] - 48 y.o. male MRN [id number]  Date of birth: Sep 29, 1975  Subjective Chief Complaint  Patient presents with   Annual Exam    HPI Robert Young is a 48 y.o. male here today for annual exam.   History of ITP and is followed by hematology.  Stable at this time.   He continues to stay pretty active.  He feels that his diet is pretty good.   Non-smoker.  Occasional EtOH use.   Review of Systems  Constitutional:  Negative for chills, fever, malaise/fatigue and weight loss.  HENT:  Negative for congestion, ear pain and sore throat.   Eyes:  Negative for blurred vision, double vision and pain.  Respiratory:  Negative for cough and shortness of breath.   Cardiovascular:  Negative for chest pain and palpitations.  Gastrointestinal:  Negative for abdominal pain, blood in stool, constipation, heartburn and nausea.  Genitourinary:  Negative for dysuria and urgency.  Musculoskeletal:  Negative for joint pain and myalgias.  Neurological:  Negative for dizziness and headaches.  Endo/Heme/Allergies:  Does not bruise/bleed easily.  Psychiatric/Behavioral:  Negative for depression. The patient is not nervous/anxious and does not have insomnia.     Allergies  Allergen Reactions   Zyrtec [Cetirizine] Hypertension    Past Medical History:  Diagnosis Date   Allergy    Idiopathic thrombocythemia (HCC)    Left lower lobe pneumonia 08/24/2017    Past Surgical History:  Procedure Laterality Date   TONSILLECTOMY     TONSILLECTOMY AND ADENOIDECTOMY      Social History   Socioeconomic History   Marital status: Married    Spouse name: Not on file   Number of children: Not on file   Years of education: Not on file   Highest education level: Not on file  Occupational History   Occupation: physicist   Tobacco Use   Smoking status: Never   Smokeless tobacco: Former    Types: Chew    Quit date: 07/27/2008   Tobacco comments:    NEVER USED TOBACCO  Vaping Use   Vaping  status: Never Used  Substance and Sexual Activity   Alcohol use: Yes    Alcohol/week: 0.0 standard drinks of alcohol    Comment: few drinks per week    Drug use: No   Sexual activity: Yes    Birth control/protection: None  Other Topics Concern   Not on file  Social History Narrative   Not on file   Social Drivers of Health   Financial Resource Strain: Low Risk  (02/16/2024)   Overall Financial Resource Strain (CARDIA)    Difficulty of Paying Living Expenses: Not hard at all  Food Insecurity: No Food Insecurity (02/16/2024)   Hunger Vital Sign    Worried About Running Out of Food in the Last Year: Never true    Ran Out of Food in the Last Year: Never true  Transportation Needs: No Transportation Needs (02/16/2024)   PRAPARE - Administrator, Civil Service (Medical): No    Lack of Transportation (Non-Medical): No  Physical Activity: Sufficiently Active (02/16/2024)   Exercise Vital Sign    Days of Exercise per Week: 5 days    Minutes of Exercise per Session: 60 min  Stress: No Stress Concern Present (02/16/2024)   [employer] of Occupational Health - Occupational Stress Questionnaire    Feeling of Stress: Not at all  Social Connections: Moderately Integrated (02/16/2024)   Social Connection and Isolation Panel  Frequency of Communication with Friends and Family: More than three times a week    Frequency of Social Gatherings with Friends and Family: Once a week    Attends Religious Services: Patient declined    Database administrator or Organizations: Patient declined    Attends Engineer, structural: More than 4 times per year    Marital Status: Married    Family History  Problem Relation Age of Onset   Heart disease Father    Hyperlipidemia Father    Hypertension Father    Stroke Father        at age 16's    Heart disease Paternal Uncle    Cancer Paternal Grandmother        breast    Health Maintenance  Topic Date Due   HPV VACCINES (1 -  3-dose SCDM series) Never done   Colonoscopy  Never done   COVID-19 Vaccine (3 - 2024-25 season) 05/01/2023   INFLUENZA VACCINE  03/30/2024   DTaP/Tdap/Td (3 - Td or Tdap) 09/21/2028   Hepatitis C Screening  Completed   HIV Screening  Completed   Meningococcal B Vaccine  Aged Out     ----------------------------------------------------------------------------------------------------------------------------------------------------------------------------------------------------------------- Physical Exam BP 122/75 (BP Location: Left Arm, Patient Position: Sitting, Cuff Size: Normal)   Pulse (!) 52   Ht 5' 6 (1.676 m)   Wt 179 lb (81.2 kg)   SpO2 99%   BMI 28.89 kg/m   Physical Exam Constitutional:      General: He is not in acute distress. HENT:     Head: Normocephalic and atraumatic.     Right Ear: Tympanic membrane and external ear normal.     Left Ear: Tympanic membrane and external ear normal.   Eyes:     General: No scleral icterus.  Neck:     Thyroid : No thyromegaly.   Cardiovascular:     Rate and Rhythm: Normal rate and regular rhythm.     Heart sounds: Normal heart sounds.  Pulmonary:     Effort: Pulmonary effort is normal.     Breath sounds: Normal breath sounds.  Abdominal:     General: Bowel sounds are normal. There is no distension.     Palpations: Abdomen is soft.     Tenderness: There is no abdominal tenderness. There is no guarding.   Musculoskeletal:     Cervical back: Normal range of motion.  Lymphadenopathy:     Cervical: No cervical adenopathy.   Skin:    General: Skin is warm and dry.     Findings: No rash.   Neurological:     Mental Status: He is alert and oriented to person, place, and time.     Cranial Nerves: No cranial nerve deficit.     Motor: No abnormal muscle tone.   Psychiatric:        Mood and Affect: Mood normal.        Behavior: Behavior normal.      ------------------------------------------------------------------------------------------------------------------------------------------------------------------------------------------------------------------- Assessment and Plan  Well adult exam Well adult Orders Placed This Encounter  Procedures   Lipid panel   Ambulatory referral to Gastroenterology    Referral Priority:   Routine    Referral Type:   Consultation    Referral Reason:   Specialty Services Required    Number of Visits Requested:   1  Screenings: per lab orders Immunizations: UTD Anticipatory guidance/Risk factor reduction:  Recommendations per AVS.    No orders of the defined types were placed in this encounter.  No follow-ups on file.

## 2024-02-16 NOTE — Assessment & Plan Note (Signed)
 Well adult Orders Placed This Encounter  Procedures   Lipid panel   Ambulatory referral to Gastroenterology    Referral Priority:   Routine    Referral Type:   Consultation    Referral Reason:   Specialty Services Required    Number of Visits Requested:   1  Screenings: per lab orders Immunizations: UTD Anticipatory guidance/Risk factor reduction:  Recommendations per AVS.

## 2024-03-06 ENCOUNTER — Encounter: Payer: Self-pay | Admitting: Family Medicine

## 2024-03-07 ENCOUNTER — Encounter: Payer: Self-pay | Admitting: Gastroenterology

## 2024-03-16 ENCOUNTER — Encounter: Payer: Self-pay | Admitting: Family

## 2024-03-20 ENCOUNTER — Other Ambulatory Visit (HOSPITAL_COMMUNITY): Payer: Self-pay

## 2024-03-20 ENCOUNTER — Ambulatory Visit (AMBULATORY_SURGERY_CENTER)

## 2024-03-20 ENCOUNTER — Encounter: Payer: Self-pay | Admitting: Gastroenterology

## 2024-03-20 ENCOUNTER — Encounter: Payer: Self-pay | Admitting: Family

## 2024-03-20 VITALS — Ht 70.0 in | Wt 180.0 lb

## 2024-03-20 DIAGNOSIS — Z1211 Encounter for screening for malignant neoplasm of colon: Secondary | ICD-10-CM

## 2024-03-20 MED ORDER — NA SULFATE-K SULFATE-MG SULF 17.5-3.13-1.6 GM/177ML PO SOLN
1.0000 | Freq: Once | ORAL | 0 refills | Status: AC
  Filled 2024-03-20: qty 354, 1d supply, fill #0

## 2024-03-20 NOTE — Progress Notes (Signed)

## 2024-03-27 ENCOUNTER — Other Ambulatory Visit (HOSPITAL_COMMUNITY): Payer: Self-pay

## 2024-04-03 ENCOUNTER — Encounter: Payer: Self-pay | Admitting: Gastroenterology

## 2024-04-03 ENCOUNTER — Ambulatory Visit (AMBULATORY_SURGERY_CENTER): Admitting: Gastroenterology

## 2024-04-03 VITALS — BP 108/56 | HR 51 | Temp 97.4°F | Resp 12 | Ht 66.0 in | Wt 180.0 lb

## 2024-04-03 DIAGNOSIS — Z1211 Encounter for screening for malignant neoplasm of colon: Secondary | ICD-10-CM | POA: Diagnosis not present

## 2024-04-03 MED ORDER — SODIUM CHLORIDE 0.9 % IV SOLN: 500.0000 mL | Freq: Once | INTRAVENOUS | Status: DC

## 2024-04-03 NOTE — Patient Instructions (Signed)
  Resume all of your previus medications today as ordered.  Read your discharged instructions.   YOU HAD AN ENDOSCOPIC PROCEDURE TODAY AT THE [facility]:   Refer to the procedure report that was given to you for any specific questions about what was found during the examination.  If the procedure report does not answer your questions, please call your gastroenterologist to clarify.  If you requested that your care partner not be given the details of your procedure findings, then the procedure report has been included in a sealed envelope for you to review at your convenience later.  YOU SHOULD EXPECT: Some feelings of bloating in the abdomen. Passage of more gas than usual.  Walking can help get rid of the air that was put into your GI tract during the procedure and reduce the bloating. If you had a lower endoscopy (such as a colonoscopy or flexible sigmoidoscopy) you may notice spotting of blood in your stool or on the toilet paper. If you underwent a bowel prep for your procedure, you may not have a normal bowel movement for a few days.  Please Note:  You might notice some irritation and congestion in your nose or some drainage.  This is from the oxygen used during your procedure.  There is no need for concern and it should clear up in a day or so.  SYMPTOMS TO REPORT IMMEDIATELY:  Following lower endoscopy (colonoscopy or flexible sigmoidoscopy):  Excessive amounts of blood in the stool  Significant tenderness or worsening of abdominal pains  Swelling of the abdomen that is new, acute  Fever of 100F or higher   For urgent or emergent issues, a gastroenterologist can be reached at any hour by calling [phone]. Do not use MyChart messaging for urgent concerns.    DIET:  We do recommend a small meal at first, but then you may proceed to your regular diet.  Drink plenty of fluids but you should avoid alcoholic beverages for 24 hours.  ACTIVITY:  You should plan to take it  easy for the rest of today and you should NOT DRIVE or use heavy machinery until tomorrow (because of the sedation medicines used during the test).    FOLLOW UP: Our staff will call the number listed on your records the next business day following your procedure.  We will call around 7:15- 8:00 am to check on you and address any questions or concerns that you may have regarding the information given to you following your procedure. If we do not reach you, we will leave a message.      SIGNATURES/CONFIDENTIALITY: You and/or your care partner have signed paperwork which will be entered into your electronic medical record.  These signatures attest to the fact that that the information above on your After Visit Summary has been reviewed and is understood.  Full responsibility of the confidentiality of this discharge information lies with you and/or your care-partner.

## 2024-04-03 NOTE — Progress Notes (Signed)
 Sedate, gd SR, tolerated procedure well, VSS, report to RN

## 2024-04-03 NOTE — Op Note (Signed)
 [facility] Patient Name: Robert Young Procedure Date: 04/03/2024 7:50 AM MRN: [id number] Endoscopist: Victory L. Legrand , MD, [id number] Age: 48 Referring MD:  Date of Birth: Jan 22, 1976 Gender: Male Account #: [id number] Procedure:                Colonoscopy Indications:              Screening for colorectal malignant neoplasm, This                            is the patient's first colonoscopy Medicines:                Monitored Anesthesia Care Procedure:                Pre-Anesthesia Assessment:                           - Prior to the procedure, a History and Physical                            was performed, and patient medications and                            allergies were reviewed. The patient's tolerance of                            previous anesthesia was also reviewed. The risks                            and benefits of the procedure and the sedation                            options and risks were discussed with the patient.                            All questions were answered, and informed consent                            was obtained. Prior Anticoagulants: The patient has                            taken no anticoagulant or antiplatelet agents. ASA                            Grade Assessment: II - A patient with mild systemic                            disease. After reviewing the risks and benefits,                            the patient was deemed in satisfactory condition to                            undergo the procedure.  After obtaining informed consent, the colonoscope                            was passed under direct vision. Throughout the                            procedure, the patient's blood pressure, pulse, and                            oxygen saturations were monitored continuously. The                            Olympus Scope SN [id number] was introduced through the                            anus and advanced to the  the cecum, identified by                            appendiceal orifice and ileocecal valve. The                            colonoscopy was performed without difficulty. The                            patient tolerated the procedure well. The quality                            of the bowel preparation was good. The ileocecal                            valve, appendiceal orifice, and rectum were                            photographed. Scope In: 8:09:05 AM Scope Out: 8:22:21 AM Scope Withdrawal Time: 0 hours 11 minutes 39 seconds  Total Procedure Duration: 0 hours 13 minutes 16 seconds  Findings:                 The perianal and digital rectal examinations were                            normal.                           Repeat examination of right colon under NBI                            performed.                           The entire examined colon appeared normal on direct                            and retroflexion views. Complications:            No immediate complications. Estimated Blood Loss:  Estimated blood loss: none. Impression:               - The entire examined colon is normal on direct and                            retroflexion views.                           - No specimens collected. Recommendation:           - Patient has a contact number available for                            emergencies. The signs and symptoms of potential                            delayed complications were discussed with the                            patient. Return to normal activities tomorrow.                            Written discharge instructions were provided to the                            patient.                           - Resume previous diet.                           - Continue present medications.                           - Repeat colonoscopy in 10 years for screening                            purposes. Kolby Schara L. Legrand, MD 04/03/2024 8:25:40 AM This report has been  signed electronically.

## 2024-04-03 NOTE — Progress Notes (Signed)
 History and Physical:  This patient presents for endoscopic testing for: Encounter Diagnosis  Name Primary?   Special screening for malignant neoplasms, colon Yes    Average risk for colorectal cancer.  1st screening exam.  Patient denies chronic abdominal pain, rectal bleeding, constipation or diarrhea.   Patient is otherwise without complaints or active issues today.   Past Medical History: Past Medical History:  Diagnosis Date   Allergy    Idiopathic thrombocythemia (HCC)    Left lower lobe pneumonia 08/24/2017     Past Surgical History: Past Surgical History:  Procedure Laterality Date   TONSILLECTOMY     TONSILLECTOMY AND ADENOIDECTOMY      Allergies: Allergies  Allergen Reactions   Zyrtec [Cetirizine] Hypertension    Outpatient Meds: Current Outpatient Medications  Medication Sig Dispense Refill   loratadine  (CLARITIN ) 5 MG chewable tablet Chew 5 mg by mouth daily.     Current Facility-Administered Medications  Medication Dose Route Frequency Provider Last Rate Last Admin   0.9 %  sodium chloride  infusion  500 mL Intravenous Once Danis, Simuel Stebner L III, MD       Facility-Administered Medications Ordered in Other Visits  Medication Dose Route Frequency Provider Last Rate Last Admin   romiPLOStim  (NPLATE ) injection 80 mcg  1 mcg/kg Subcutaneous Weekly Ennever, Maude SAUNDERS, MD          ___________________________________________________________________ Objective   Exam:  BP (!) 169/60   Pulse (!) 57   Temp (!) 97.4 F (36.3 C) (Temporal)   Resp (!) 9   Ht 5' 6 (1.676 m)   Wt 180 lb (81.6 kg)   SpO2 100%   BMI 29.05 kg/m   CV: regular , S1/S2 Resp: clear to auscultation bilaterally, normal RR and effort noted GI: soft, no tenderness, with active bowel sounds.   Assessment: Encounter Diagnosis  Name Primary?   Special screening for malignant neoplasms, colon Yes     Plan: Colonoscopy   The benefits and risks of the planned procedure(s)  were described in detail with the patient or (when appropriate) their health care proxy.  Risks were outlined as including, but not limited to, bleeding, infection, perforation, adverse medication reaction leading to cardiac or pulmonary decompensation, pancreatitis (if ERCP).  The limitation of incomplete mucosal visualization was also discussed.  No guarantees or warranties were given.  The patient is appropriate for an endoscopic procedure in the ambulatory setting.   - Victory Brand, MD

## 2024-04-04 ENCOUNTER — Telehealth: Payer: Self-pay | Admitting: *Deleted

## 2024-04-04 NOTE — Telephone Encounter (Signed)
  Follow up Call-     04/03/2024    7:09 AM  Call back number  Post procedure Call Back phone  # [phone]  Permission to leave phone message Yes     Patient questions:  Do you have a fever, pain , or abdominal swelling? No. Pain Score  0 *  Have you tolerated food without any problems? Yes.    Have you been able to return to your normal activities? Yes.    Do you have any questions about your discharge instructions: Diet   No. Medications  No. Follow up visit  No.  Do you have questions or concerns about your Care? No.  Actions: * If pain score is 4 or above: No action needed, pain <4.

## 2024-06-21 ENCOUNTER — Ambulatory Visit: Admitting: Medical Oncology

## 2024-06-21 ENCOUNTER — Inpatient Hospital Stay

## 2024-06-25 ENCOUNTER — Inpatient Hospital Stay: Admitting: Medical Oncology

## 2024-06-25 ENCOUNTER — Encounter: Payer: Self-pay | Admitting: Medical Oncology

## 2024-06-25 ENCOUNTER — Inpatient Hospital Stay: Attending: Medical Oncology

## 2024-06-25 VITALS — BP 119/78 | HR 53 | Temp 98.0°F | Resp 18 | Ht 70.0 in | Wt 182.8 lb

## 2024-06-25 DIAGNOSIS — D693 Immune thrombocytopenic purpura: Secondary | ICD-10-CM | POA: Diagnosis not present

## 2024-06-25 DIAGNOSIS — Z7952 Long term (current) use of systemic steroids: Secondary | ICD-10-CM | POA: Diagnosis not present

## 2024-06-25 DIAGNOSIS — Z79899 Other long term (current) drug therapy: Secondary | ICD-10-CM | POA: Diagnosis not present

## 2024-06-25 LAB — CMP (CANCER CENTER ONLY)
ALT: 27 U/L (ref 0–44)
AST: 34 U/L (ref 15–41)
Albumin: 4.8 g/dL (ref 3.5–5.0)
Alkaline Phosphatase: 49 U/L (ref 38–126)
Anion gap: 11 (ref 5–15)
BUN: 19 mg/dL (ref 6–20)
CO2: 27 mmol/L (ref 22–32)
Calcium: 9.4 mg/dL (ref 8.9–10.3)
Chloride: 103 mmol/L (ref 98–111)
Creatinine: 1.19 mg/dL (ref 0.61–1.24)
GFR, Estimated: >60 mL/min (ref >60)
Glucose, Bld: 96 mg/dL (ref 70–99)
Potassium: 4.3 mmol/L (ref 3.5–5.1)
Sodium: 141 mmol/L (ref 135–145)
Total Bilirubin: 0.8 mg/dL (ref 0.0–1.2)
Total Protein: 6.6 g/dL (ref 6.5–8.1)

## 2024-06-25 LAB — CBC
HCT: 43.5 % (ref 39.0–52.0)
Hemoglobin: 15.1 g/dL (ref 13.0–17.0)
MCH: 30.4 pg (ref 26.0–34.0)
MCHC: 34.7 g/dL (ref 30.0–36.0)
MCV: 87.7 fL (ref 80.0–100.0)
Platelets: 167 K/uL (ref 150–400)
RBC: 4.96 MIL/uL (ref 4.22–5.81)
RDW: 11.9 % (ref 11.5–15.5)
WBC: 5.1 K/uL (ref 4.0–10.5)
nRBC: 0 % (ref 0.0–0.2)

## 2024-06-25 NOTE — Progress Notes (Signed)
 Hematology and Oncology Follow Up Visit  ED MANDICH [id number] [birth year] 48 y.o. 06/25/2024   Principle Diagnosis:  Chronic immune thrombocytopenia  - Relapsed  Current Therapy:  Decadron  40 mg po q day x 4 days every 2 weeks - as needed Nplate  - dose adjusted per pharmacy - as indicated     Interim History:  [name] is here today for follow-up.  We see him every 6 months. He works in [employer].  He has been stable now for quite some time.   Today he reports that he has been doing well. He has no concerns.   There has been no bleeding to his knowledge: denies epistaxis, gingivitis, hemoptysis, hematemesis, hematuria, melena, excessive bruising, blood donation.   He has had no problems with bleeding or bruising.  He has had no problems with cough or shortness of breath.  He has had no change in bowel or bladder habits.  There is been no swollen lymph nodes.  He has had no issues with rashes.  Currently, I would have to say that his performance status is probably ECOG 0.   Wt Readings from Last 3 Encounters:  06/25/24 182 lb 12.8 oz (82.9 kg)  04/03/24 180 lb (81.6 kg)  03/20/24 180 lb (81.6 kg)     Medications:  Allergies as of 06/25/2024       Reactions   Zyrtec [cetirizine] Hypertension        Medication List        Accurate as of June 25, 2024 11:25 AM. If you have any questions, ask your nurse or doctor.          loratadine  5 MG chewable tablet Commonly known as: CLARITIN  Chew 5 mg by mouth daily.        Allergies:  Allergies  Allergen Reactions   Zyrtec [Cetirizine] Hypertension    Past Medical History, Surgical history, Social history, and Family History were reviewed and updated.  Review of Systems: Review of Systems  Constitutional: Negative.   HENT: Negative.    Eyes: Negative.   Respiratory: Negative.    Cardiovascular: Negative.   Gastrointestinal: Negative.   Genitourinary: Negative.   Musculoskeletal: Negative.    Skin: Negative.   Neurological: Negative.   Endo/Heme/Allergies: Negative.   Psychiatric/Behavioral: Negative.       Physical Exam:  height is 5' 10 (1.778 m) and weight is 182 lb 12.8 oz (82.9 kg). His oral temperature is 98 F (36.7 C). His blood pressure is 119/78 and his pulse is 53 (abnormal). His respiration is 18 and oxygen saturation is 100%.   Wt Readings from Last 3 Encounters:  06/25/24 182 lb 12.8 oz (82.9 kg)  04/03/24 180 lb (81.6 kg)  03/20/24 180 lb (81.6 kg)    Physical Exam Vitals reviewed.  HENT:     Head: Normocephalic and atraumatic.  Eyes:     Pupils: Pupils are equal, round, and reactive to light.  Cardiovascular:     Rate and Rhythm: Normal rate and regular rhythm.     Heart sounds: Normal heart sounds.  Pulmonary:     Effort: Pulmonary effort is normal.     Breath sounds: Normal breath sounds.  Musculoskeletal:        General: No tenderness or deformity. Normal range of motion.     Cervical back: Normal range of motion.  Lymphadenopathy:     Cervical: No cervical adenopathy.  Skin:    General: Skin is warm and dry.     Findings:  No erythema or rash.  Neurological:     Mental Status: He is alert and oriented to person, place, and time.  Psychiatric:        Behavior: Behavior normal.        Thought Content: Thought content normal.        Judgment: Judgment normal.     Lab Results  Component Value Date   WBC 5.1 06/25/2024   HGB 15.1 06/25/2024   HCT 43.5 06/25/2024   MCV 87.7 06/25/2024   PLT 167 06/25/2024   No results found for: FERRITIN, IRON, TIBC, UIBC, IRONPCTSAT Lab Results  Component Value Date   RBC 4.96 06/25/2024   No results found for: KPAFRELGTCHN, LAMBDASER, KAPLAMBRATIO No results found for: KIMBERLY LE, IGMSERUM No results found for: STEPHANY CARLOTA BENSON MARKEL EARLA JOANNIE DOC VICK, SPEI   Chemistry      Component Value Date/Time   NA 141 06/25/2024  1021   NA 142 07/01/2017 1246   K 4.3 06/25/2024 1021   K 4.1 07/01/2017 1246   CL 103 06/25/2024 1021   CL 94 (L) 09/06/2016 1502   CO2 27 06/25/2024 1021   CO2 30 (H) 07/01/2017 1246   BUN 19 06/25/2024 1021   BUN 12.4 07/01/2017 1246   CREATININE 1.19 06/25/2024 1021   CREATININE 1.12 01/31/2023 1032   CREATININE 1.1 07/01/2017 1246      Component Value Date/Time   CALCIUM 9.4 06/25/2024 1021   CALCIUM 9.1 07/01/2017 1246   ALKPHOS 49 06/25/2024 1021   ALKPHOS 61 07/01/2017 1246   AST 34 06/25/2024 1021   AST 19 07/01/2017 1246   ALT 27 06/25/2024 1021   ALT 13 07/01/2017 1246   BILITOT 0.8 06/25/2024 1021   BILITOT 0.66 07/01/2017 1246     Encounter Diagnosis  Name Primary?   Chronic ITP (idiopathic thrombocytopenia) (HCC) Yes    Impression and Plan: Mr. Snelson is a 48 year old white male. He has relapsed immune thrombocytopenia.  He continues to do well. CBC reviewed and is normal.  CMP pending Extending follow up to 1 year with a CBC in 6 months  RTC 6 months labs RTC 12 months APP, labs (CBC, CMP)-[place]    Lauraine CHRISTELLA Dais, PA-C 10/27/202511:25 AM

## 2024-07-30 DIAGNOSIS — H524 Presbyopia: Secondary | ICD-10-CM | POA: Diagnosis not present

## 2024-12-24 ENCOUNTER — Inpatient Hospital Stay

## 2025-02-18 ENCOUNTER — Encounter: Admitting: Family Medicine

## 2025-06-24 ENCOUNTER — Inpatient Hospital Stay

## 2025-06-24 ENCOUNTER — Inpatient Hospital Stay: Admitting: Medical Oncology
